# Patient Record
Sex: Female | Born: 1950
Health system: Southern US, Community
[De-identification: ages and names within clinical notes are randomized; demographics above are authoritative.]

## PROBLEM LIST (undated history)

## (undated) DIAGNOSIS — F329 Major depressive disorder, single episode, unspecified: Secondary | ICD-10-CM

## (undated) DIAGNOSIS — I10 Essential (primary) hypertension: Secondary | ICD-10-CM

## (undated) DIAGNOSIS — E039 Hypothyroidism, unspecified: Secondary | ICD-10-CM

## (undated) DIAGNOSIS — K5730 Diverticulosis of large intestine without perforation or abscess without bleeding: Secondary | ICD-10-CM

## (undated) DIAGNOSIS — K219 Gastro-esophageal reflux disease without esophagitis: Secondary | ICD-10-CM

## (undated) DIAGNOSIS — E785 Hyperlipidemia, unspecified: Secondary | ICD-10-CM

## (undated) DIAGNOSIS — Z8619 Personal history of other infectious and parasitic diseases: Secondary | ICD-10-CM

## (undated) DIAGNOSIS — K589 Irritable bowel syndrome without diarrhea: Secondary | ICD-10-CM

## (undated) DIAGNOSIS — M1990 Unspecified osteoarthritis, unspecified site: Secondary | ICD-10-CM

## (undated) DIAGNOSIS — T7840XA Allergy, unspecified, initial encounter: Secondary | ICD-10-CM

## (undated) DIAGNOSIS — F32A Depression, unspecified: Secondary | ICD-10-CM

## (undated) HISTORY — DX: Irritable bowel syndrome, unspecified: K58.9

## (undated) HISTORY — PX: CHOLECYSTECTOMY: SHX55

## (undated) HISTORY — DX: Hyperlipidemia, unspecified: E78.5

## (undated) HISTORY — DX: Diverticulosis of large intestine without perforation or abscess without bleeding: K57.30

## (undated) HISTORY — DX: Hypothyroidism, unspecified: E03.9

## (undated) HISTORY — PX: DILATION AND CURETTAGE OF UTERUS: SHX78

## (undated) HISTORY — DX: Essential (primary) hypertension: I10

## (undated) HISTORY — DX: Allergy, unspecified, initial encounter: T78.40XA

## (undated) HISTORY — DX: Unspecified osteoarthritis, unspecified site: M19.90

## (undated) HISTORY — DX: Personal history of other infectious and parasitic diseases: Z86.19

## (undated) HISTORY — DX: Gastro-esophageal reflux disease without esophagitis: K21.9

---

## 1972-12-12 HISTORY — PX: APPENDECTOMY: SHX54

## 1981-12-12 HISTORY — PX: ABDOMINAL HYSTERECTOMY: SHX81

## 1981-12-12 HISTORY — PX: PARTIAL HYSTERECTOMY: SHX80

## 1988-12-12 HISTORY — PX: BLADDER DIVERTICULECTOMY: SHX1235

## 1997-12-12 HISTORY — PX: NASAL SEPTUM SURGERY: SHX37

## 2000-11-04 ENCOUNTER — Ambulatory Visit (HOSPITAL_COMMUNITY): Admission: RE | Admit: 2000-11-04 | Discharge: 2000-11-04 | Payer: Self-pay | Admitting: Neurosurgery

## 2000-11-04 ENCOUNTER — Encounter: Payer: Self-pay | Admitting: Neurosurgery

## 2005-06-09 ENCOUNTER — Ambulatory Visit: Payer: Self-pay | Admitting: Obstetrics and Gynecology

## 2006-06-27 ENCOUNTER — Ambulatory Visit: Payer: Self-pay | Admitting: Obstetrics and Gynecology

## 2007-03-27 ENCOUNTER — Ambulatory Visit: Payer: Self-pay | Admitting: Internal Medicine

## 2007-03-29 ENCOUNTER — Ambulatory Visit: Payer: Self-pay | Admitting: Internal Medicine

## 2007-03-29 ENCOUNTER — Encounter (INDEPENDENT_AMBULATORY_CARE_PROVIDER_SITE_OTHER): Payer: Self-pay | Admitting: Specialist

## 2007-04-04 ENCOUNTER — Ambulatory Visit (HOSPITAL_COMMUNITY): Admission: RE | Admit: 2007-04-04 | Discharge: 2007-04-04 | Payer: Self-pay | Admitting: Internal Medicine

## 2007-05-18 ENCOUNTER — Ambulatory Visit (HOSPITAL_COMMUNITY): Admission: RE | Admit: 2007-05-18 | Discharge: 2007-05-18 | Payer: Self-pay | Admitting: General Surgery

## 2007-05-18 ENCOUNTER — Encounter (INDEPENDENT_AMBULATORY_CARE_PROVIDER_SITE_OTHER): Payer: Self-pay | Admitting: General Surgery

## 2007-07-02 ENCOUNTER — Ambulatory Visit: Payer: Self-pay | Admitting: Obstetrics and Gynecology

## 2008-07-03 ENCOUNTER — Ambulatory Visit: Payer: Self-pay | Admitting: Obstetrics and Gynecology

## 2009-02-19 DIAGNOSIS — K5730 Diverticulosis of large intestine without perforation or abscess without bleeding: Secondary | ICD-10-CM | POA: Insufficient documentation

## 2009-02-19 DIAGNOSIS — E039 Hypothyroidism, unspecified: Secondary | ICD-10-CM | POA: Insufficient documentation

## 2009-02-19 DIAGNOSIS — K589 Irritable bowel syndrome without diarrhea: Secondary | ICD-10-CM | POA: Insufficient documentation

## 2009-02-19 DIAGNOSIS — R109 Unspecified abdominal pain: Secondary | ICD-10-CM | POA: Insufficient documentation

## 2009-02-19 DIAGNOSIS — R110 Nausea: Secondary | ICD-10-CM | POA: Insufficient documentation

## 2009-02-24 ENCOUNTER — Ambulatory Visit: Payer: Self-pay | Admitting: Internal Medicine

## 2009-02-27 ENCOUNTER — Encounter: Payer: Self-pay | Admitting: Gastroenterology

## 2009-02-27 ENCOUNTER — Ambulatory Visit: Payer: Self-pay | Admitting: Gastroenterology

## 2009-03-03 ENCOUNTER — Encounter: Payer: Self-pay | Admitting: Gastroenterology

## 2009-03-04 ENCOUNTER — Telehealth: Payer: Self-pay | Admitting: Internal Medicine

## 2009-03-27 ENCOUNTER — Ambulatory Visit: Payer: Self-pay | Admitting: Internal Medicine

## 2009-03-27 DIAGNOSIS — Z860100 Personal history of colon polyps, unspecified: Secondary | ICD-10-CM | POA: Insufficient documentation

## 2009-07-06 ENCOUNTER — Ambulatory Visit: Payer: Self-pay | Admitting: Obstetrics and Gynecology

## 2010-02-09 ENCOUNTER — Ambulatory Visit: Payer: Self-pay | Admitting: Urology

## 2010-04-11 LAB — CONVERTED CEMR LAB: Pap Smear: NORMAL

## 2010-06-11 LAB — HM MAMMOGRAPHY: HM Mammogram: NORMAL

## 2010-07-07 ENCOUNTER — Ambulatory Visit: Payer: Self-pay | Admitting: Obstetrics and Gynecology

## 2010-09-09 ENCOUNTER — Ambulatory Visit: Payer: Self-pay | Admitting: Family Medicine

## 2010-09-09 DIAGNOSIS — J342 Deviated nasal septum: Secondary | ICD-10-CM | POA: Insufficient documentation

## 2010-10-07 ENCOUNTER — Telehealth: Payer: Self-pay | Admitting: Internal Medicine

## 2010-10-07 ENCOUNTER — Ambulatory Visit: Payer: Self-pay | Admitting: Internal Medicine

## 2010-10-07 DIAGNOSIS — R1032 Left lower quadrant pain: Secondary | ICD-10-CM | POA: Insufficient documentation

## 2010-10-07 LAB — CONVERTED CEMR LAB
Bilirubin Urine: NEGATIVE
Casts: 0 /lpf
Ketones, urine, test strip: NEGATIVE
Specific Gravity, Urine: 1.01
Urobilinogen, UA: 0.2
pH: 6

## 2010-10-08 ENCOUNTER — Encounter: Payer: Self-pay | Admitting: Family Medicine

## 2010-10-08 LAB — CONVERTED CEMR LAB
ALT: 17 units/L (ref 0–35)
AST: 16 units/L (ref 0–37)
Albumin: 3.9 g/dL (ref 3.5–5.2)
Alkaline Phosphatase: 77 units/L (ref 39–117)
BUN: 12 mg/dL (ref 6–23)
Basophils Relative: 0.6 % (ref 0.0–3.0)
Chloride: 97 meq/L (ref 96–112)
Eosinophils Relative: 0.7 % (ref 0.0–5.0)
Glucose, Bld: 80 mg/dL (ref 70–99)
Lipase: 32 units/L (ref 11.0–59.0)
Lymphocytes Relative: 35.2 % (ref 12.0–46.0)
MCV: 90.6 fL (ref 78.0–100.0)
Monocytes Relative: 7.8 % (ref 3.0–12.0)
Neutrophils Relative %: 55.7 % (ref 43.0–77.0)
Potassium: 3.7 meq/L (ref 3.5–5.1)
RBC: 4.38 M/uL (ref 3.87–5.11)
WBC: 8.9 10*3/uL (ref 4.5–10.5)

## 2010-10-11 ENCOUNTER — Ambulatory Visit: Payer: Self-pay | Admitting: Gastroenterology

## 2010-10-12 ENCOUNTER — Ambulatory Visit: Payer: Self-pay | Admitting: Cardiovascular Disease

## 2010-11-09 ENCOUNTER — Ambulatory Visit: Payer: Self-pay | Admitting: Internal Medicine

## 2010-11-09 LAB — HM COLONOSCOPY

## 2010-11-17 ENCOUNTER — Telehealth (INDEPENDENT_AMBULATORY_CARE_PROVIDER_SITE_OTHER): Payer: Self-pay | Admitting: *Deleted

## 2010-11-18 ENCOUNTER — Ambulatory Visit: Payer: Self-pay | Admitting: Internal Medicine

## 2010-11-18 LAB — CONVERTED CEMR LAB
Nitrite: NEGATIVE
Specific Gravity, Urine: <1.005
Urobilinogen, UA: 0.2

## 2010-11-23 ENCOUNTER — Ambulatory Visit: Payer: Self-pay | Admitting: Urology

## 2010-11-29 ENCOUNTER — Ambulatory Visit: Payer: Self-pay | Admitting: Urology

## 2011-01-11 NOTE — Progress Notes (Signed)
Summary: uti  Phone Note Call from Patient Call back at Home Phone [phone]   Caller: Patient Call For: Judith Part MD Summary of Call: Patient was seen by Dr. Sharen Hones on 10-07-10 and was treated for a UTI. Patient is calling today stating that she is in pain, having burning with urination and feels exactly the way she did before taking the cipro and the flagyl. She is asking if she could have something called in for the uti. I explained to patient that she would possibly need an office visit, and she was fine with that, but we have not openings until Friday. Please advise. Uses [facility] on [address] if needed.  Initial call taken by: Melody Comas,  November 17, 2010 1:55 PM  Follow-up for Phone Call        actually it sounds like she had diverticular flare - and not uti in light of urine culture that was neg  (correct me if I am wrong...)  if she has urinary burning -- please come in now and leave urine sample let me know if other symptoms as well  we are all in mtg until end of the day-- the doctors I will look at urine at end of day and instruct further  if she has worrisome symptoms-- fever / abd pain-- then may have to send her to urgent care (or call GI if she is having diverticulitis symptoms)   Follow-up by: Judith Part MD,  November 17, 2010 2:47 PM  Additional Follow-up for Phone Call Additional follow up Details #1::        Sp w/ pt, appt scheduled for Thursday, Dec 8th w/ Dr. Sharen Hones.Daine Gip  November 17, 2010 5:25 PM  Additional Follow-up by: Daine Gip,  November 17, 2010 5:25 PM

## 2011-01-11 NOTE — Assessment & Plan Note (Signed)
Summary: ACUTE ??UTI-SEE DR TOWER'S NOTES CYD   Vital Signs:  Patient profile:   60 year old female Weight:      176.75 pounds Temp:     98.9 degrees F oral Pulse rate:   84 / minute Pulse rhythm:   regular BP sitting:   150 / 90  (left arm) Cuff size:   regular  Vitals Entered By: Selena Batten Dance CMA Duncan Dull) (November 18, 2010 3:30 PM) CC: ? UTI   History of Present Illness: CC: L sided pain  1 wk 2d h/o L lower abd pain as well as flank area.  Pain characterized as soreness and cramping in LLQ and L lower back.  No stool changes, no fevers/chills, blood in stool or urine.  No new foods in last week.  Has cut out meats.  Eating fruits and vegetables and cereal and soups.  Appetite ok.  Saw Dr. Juanda Chance last week, thought improving, now returned.  Weight pretty stable.  No shooting pain down into groin.  h/o cholecystectomy with residual diarrhea.  has been taking bentyl 10mg  in am and align every morning and not noticed much improvement.  taking miralax daily.  Has been on bentyl for a couple years.  Similar pain to previously.  Current Medications (verified): 1)  Zyrtec Allergy 10 Mg Tabs (Cetirizine Hcl) .... One Tablet By Mouth Once Daily As Needed 2)  Multivitamins   Tabs (Multiple Vitamin) .... One Tablet By Mouth Once Daily 3)  Advil 200 Mg Tabs (Ibuprofen) .... Otc As Directed. 4)  Advil Congestion Relief 10-200 Mg Tabs (Phenylephrine-Ibuprofen) .... Otc As Directed. 5)  Miralax  Powd (Polyethylene Glycol 3350) .... Once Daily 6)  Flexeril 10 Mg Tabs (Cyclobenzaprine Hcl) .... 1/2 To 1 By Mouth Up Three Times A Day As Needed Neck Pain Watch Out For Sedation 7)  Calcium-Vitamin D 500-200 Mg-Unit Tabs (Calcium-Vitamin D) .... Once Daily 8)  Align  Caps (Probiotic Product) .... Once Daily 9)  Bentyl 10 Mg Caps (Dicyclomine Hcl) .Marland Kitchen.. 1 By Mouth Every A.m.  Allergies (verified): No Known Drug Allergies  Past History:  Past Medical History: Last updated:  10/11/2010 HYPOTHYROIDISM (ICD-244.9) IRRITABLE BOWEL SYNDROME (ICD-564.1) HYPERTENSION (ICD-401.9) DIVERTICULOSIS, COLON (ICD-562.10) hyperplastic colon polyps  hyperlipidemia  cystitis frequent (? related to ibs)  osteopenia  all rhinitis   Dr Juanda Chance - GI  urology Dr Karis Juba - at [facility] - gyn  neurosx-- Dr Channing Mutters  Review of Systems       per HPI  Physical Exam  General:  Well developed, well nourished. nontoxic.  uncomfortable with motion. Mouth:  Oral mucosa and oropharynx without lesions or exudates.  Teeth in good repair.  slight dry mm Lungs:  Clear throughout to auscultation.  no crackles/wheezing Heart:  Regular rate and rhythm; no murmurs, rubs or bruits. Abdomen:  soft, ND, NABS, minmially tender throughout, main tenderness concentrated at LLQ.  Neg rebound, no guarding.  no masses, no HSM appreciated.  + tenderness with CVA on L side Pulses:  2+ rad pulses Extremities:  no edema Skin:  Intact without suspicious lesions or rashes   Impression & Recommendations:  Problem # 1:  ABDOMINAL PAIN, LEFT LOWER QUADRANT (ICD-789.04) unclear etiology.  blood work from 10/27 looking ok.  abd/pelvic CT w/ contrast from 10/12/2010 negative for acute process.  pt states improved with cipro/flagyl initially.  Has been on low dose bentyl, align, and miralax but not noticing improving.  No fevers.  ? flare of IBS.  UA again  today with mod blood but not significant on micro.  given findings of CVA tenderness and micro hematuria, check KUB - no stone evident.  Treat as stone with vicodin, flomax, strain urine.  Has been going on for 2 mo.  no weight changes.  nonsmoker.  If not improved with above measures, referral to uro for eval.  discussed KUB with patient.  advised to watch for constipation.  Orders: Admin of Therapeutic Inj  intramuscular or subcutaneous (95188) Ketorolac-Toradol 15mg  (C1660) T-1 View Abdomen (KUB) (74000TC) UA Dipstick W/ Micro (manual) (63016)  Complete  Medication List: 1)  Zyrtec Allergy 10 Mg Tabs (Cetirizine hcl) .... One tablet by mouth once daily as needed 2)  Multivitamins Tabs (Multiple vitamin) .... One tablet by mouth once daily 3)  Advil 200 Mg Tabs (Ibuprofen) .... Otc as directed. 4)  Advil Congestion Relief 10-200 Mg Tabs (Phenylephrine-ibuprofen) .... Otc as directed. 5)  Miralax Powd (Polyethylene glycol 3350) .... Once daily 6)  Flexeril 10 Mg Tabs (Cyclobenzaprine hcl) .... 1/2 to 1 by mouth up three times a day as needed neck pain watch out for sedation 7)  Calcium-vitamin D 500-200 Mg-unit Tabs (Calcium-vitamin d) .... Once daily 8)  Align Caps (Probiotic product) .... Once daily 9)  Bentyl 10 Mg Caps (Dicyclomine hcl) .Marland Kitchen.. 1 by mouth twice daily 10)  Flomax 0.4 Mg Caps (Tamsulosin hcl) .... One daily for 7 days 11)  Vicodin 5-500 Mg Tabs (Hydrocodone-acetaminophen) .... One every 6 hours as needed pain  Patient Instructions: 1)  I'll call you with results of xray. 2)  Start flomax daily for 7 days (helps relax ureters). 3)  vicodin for pain. 4)  Strain urine. 5)  Push fluids. 6)  If not better, call us and we will set you up with urologist. Prescriptions: VICODIN 5-500 MG TABS (HYDROCODONE-ACETAMINOPHEN) one every 6 hours as needed pain  #20 x 0   Entered and Authorized by:   Eustaquio Boyden  MD   Signed by:   Eustaquio Boyden  MD on 11/18/2010   Method used:   Print then Give to Patient   RxID:   [id number] FLOMAX 0.4 MG CAPS (TAMSULOSIN HCL) one daily for 7 days  #7 x 0   Entered and Authorized by:   Eustaquio Boyden  MD   Signed by:   Eustaquio Boyden  MD on 11/18/2010   Method used:   Electronically to        [facility] [address] (retail)       [address], [address]       [place], [place]  [address]       Ph: [phone]       Fax: [phone]   RxID:   [id number]    Medication Administration  Injection # 1:    Medication: Ketorolac-Toradol 15mg      Diagnosis: ABDOMINAL PAIN, LEFT LOWER QUADRANT (ICD-789.04)    Route: IM    Site: LUOQ gluteus    Exp Date: 02/10/2012    Lot #: [id number]    Mfr: Hospira    Comments: 30mg  per Dr. Sharen Hones    Patient tolerated injection without complications    Given by: Selena Batten Dance CMA Duncan Dull) (November 18, 2010 4:35 PM)  Orders Added: 1)  Admin of Therapeutic Inj  intramuscular or subcutaneous [96372] 2)  Ketorolac-Toradol 15mg  [J1885] 3)  T-1 View Abdomen (KUB) [74000TC] 4)  Est. Patient Level IV [93716] 5)  UA Dipstick W/  Micro (manual) [81000]    Current Allergies (reviewed today): No known allergies   Laboratory Results   Urine Tests  Date/Time Received: November 18, 2010 3:31 PM  Date/Time Reported: November 18, 2010 3:31 PM   Routine Urinalysis   Color: straw Appearance: Clear Glucose: negative   (Normal Range: Negative) Bilirubin: negative   (Normal Range: Negative) Ketone: negative   (Normal Range: Negative) Spec. Gravity: <1.005   (Normal Range: 1.003-1.035) Blood: moderate   (Normal Range: Negative) pH: 6.0   (Normal Range: 5.0-8.0) Protein: negative   (Normal Range: Negative) Urobilinogen: 0.2   (Normal Range: 0-1) Nitrite: negative   (Normal Range: Negative) Leukocyte Esterace: small   (Normal Range: Negative)  Urine Microscopic WBC/HPF: rare RBC/HPF: rare Bacteria/HPF: tr Mucous/HPF: no Epithelial/HPF: rare Crystals/HPF: no Casts/LPF: no Yeast/HPF: no    Comments: read by ................Eustaquio Boyden  MD  November 18, 2010 6:04 PM
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
## 2011-01-11 NOTE — Progress Notes (Signed)
Summary: Abd Pain   Phone Note Call from Patient Call back at Work Phone [phone]   Call For: Dr Juanda Chance Reason for Call: Talk to Nurse Summary of Call: Alot of Abd Pain & back thinks its IBS flare up. Wants to be seen sooner than her appt on tuesday next wk. Initial call taken by: Leanor Kail [facility],  October 07, 2010 8:19 AM  Follow-up for Phone Call        Patient states she has had constan tleft side pain that radiates to left back since Monday. She went to a walk in clinic and "they told me it was my bladder." Patient given Macrobid 100 mg two times a day, Uribel as needed discomfort, Vicodin as needed. Denies burning with urination, vomiting and diarrhea. Had one episode of nausea a couple of days ago and has chills. C/O constipation but states her last BM was today. She is using stool softners and Citracil. Suggested patient try Miralax 1-2 times/day for constipation, use Bentyl sparingly as it can constipate and to call her primary to recheck urinary problems. She will keep her appointment here on 10/12/10. Patient will call if worsen problems with bowels. Follow-up by: Jesse Fall RN,  October 07, 2010 9:12 AM

## 2011-01-11 NOTE — Assessment & Plan Note (Signed)
Summary: ?IBS flare/regina   History of Present Illness Visit Type: consult  Primary GI MD: Lina Sar MD Primary Provider: Roxy Manns, MD  Requesting Provider: Roxy Manns, MD  Chief Complaint: IBS flare, left side abd pain, constipation, diarrhea, bloating, and back pain  History of Present Illness:   60 YO FEMALE KNOWN TO DR. Juanda Chance. SHE HAS HX OF COLON POLYPS,IBS AND DIVERTICULOSIS. LAST COLON 3/10 WITH ONE HYPERPLASTIC POLYP, MODERATE LEFT SIDED DIVERTICULOSIS. SHE COMES IN TODAY WITH ONE WEEK HX OF LEFT SIDED ABDOMINAL PAIN  RADIATING INTO HER LEFT BACK. PAIN HAS BEEN CONSTTANT, AND SHE RATES IT AN 8/10. INITIALLY HAD NAUSEA, NO VOMITING. RELATES LOW GRADE FEVERS, CHILLS. NO DYSURIA, URGENCY. SHE HAD BEEN CONSTIPATED UNTIL TODAY WITH LOOSE STOOL-NO HEME. SHE WAS SEEN AT URGENT CARE LAST WEEK ,TOLD PROBABLE UTI BUT CULTURE WAS NEGATIVE. SHE THEN SAW DR. Sharen Hones ON 10/27  AND WAS STARTED ON CIPRO AND FLAGYL FOR PROBABLE DIVERICULITIS. LABS INCLUDING CBC/CMET ALL NEGATIVE. SHE DOES NOT FEEL ANY BETTER, IS TAKING VICODAN REGULARLY FOR THE PAIN WHICH IS CONSTANT, ACHING.   GI Review of Systems    Reports abdominal pain and  bloating.     Location of  Abdominal pain: left side.    Denies acid reflux, belching, chest pain, dysphagia with liquids, dysphagia with solids, heartburn, loss of appetite, nausea, vomiting, vomiting blood, weight loss, and  weight gain.      Reports constipation, diarrhea, and  irritable bowel syndrome.     Denies anal fissure, black tarry stools, change in bowel habit, diverticulosis, fecal incontinence, heme positive stool, hemorrhoids, jaundice, light color stool, liver problems, rectal bleeding, and  rectal pain.   Current Medications (verified): 1)  Zyrtec Allergy 10 Mg Tabs (Cetirizine Hcl) .... One Tablet By Mouth Once Daily As Needed 2)  Multivitamins   Tabs (Multiple Vitamin) .... One Tablet By Mouth Once Daily 3)  Advil 200 Mg Tabs (Ibuprofen) ....  Otc As Directed. 4)  Advil Congestion Relief 10-200 Mg Tabs (Phenylephrine-Ibuprofen) .... Otc As Directed. 5)  Miralax  Powd (Polyethylene Glycol 3350) .... Once Daily 6)  Flexeril 10 Mg Tabs (Cyclobenzaprine Hcl) .... 1/2 To 1 By Mouth Up Three Times A Day As Needed Neck Pain Watch Out For Sedation 7)  Vicodin 5-500 Mg Tabs (Hydrocodone-Acetaminophen) .Marland Kitchen.. 1 By Mouth Four Times Daily As Needed 8)  Ciprofloxacin Hcl 500 Mg Tabs (Ciprofloxacin Hcl) .... One By Mouth Two Times A Day X 10 Days 9)  Flagyl 500 Mg Tabs (Metronidazole) .... One By Mouth Three Times A Day X 10 Days  Allergies (verified): No Known Drug Allergies  Past History:  Past Medical History: HYPOTHYROIDISM (ICD-244.9) IRRITABLE BOWEL SYNDROME (ICD-564.1) HYPERTENSION (ICD-401.9) DIVERTICULOSIS, COLON (ICD-562.10) hyperplastic colon polyps  hyperlipidemia  cystitis frequent (? related to ibs)  osteopenia  all rhinitis   Dr Juanda Chance - GI  urology Dr Karis Juba - at [facility] - gyn  neurosx-- Dr Channing Mutters  Past Surgical History: Reviewed history from 10/07/2010 and no changes required. Cholecystectomy 2008 C-Section x 2 D & C X 2 Hysterectomy partial 1983  (cyst on ovary and  heavy bleeding)  Urinary bladder diverticulectomy 1990 Deviated septum 1999  colonoscopy 2010 - diverticulosis and polyp Juanda Chance)  Family History: Reviewed history from 09/09/2010 and no changes required. No FH of Colon Cancer: Family History of Diabetes: Grandfather sister with childhood rheumatic fever / valve dz  Mother : Living arthritis, elevated cholesterol, atrial fib, high blood pressure Father: Living elevated cholesterol brother DVT (  after immobilization)   Social History: Reviewed history from 09/09/2010 and no changes required. Divorced 2 boys 24, 99 -- live close by and 2 grandaughters  Alcohol Use - no Illicit Drug Use - no Patient has never smoked.  Daily Caffeine Use Occupation:Sales-- [employer]    Regular exercise-no lives in noisy environment - very stressful   Review of Systems       The patient complains of back pain and urination changes/pain.  The patient denies allergy/sinus, anemia, anxiety-new, arthritis/joint pain, blood in urine, breast changes/lumps, change in vision, confusion, cough, coughing up blood, depression-new, fainting, fatigue, fever, headaches-new, hearing problems, heart murmur, heart rhythm changes, itching, menstrual pain, muscle pains/cramps, night sweats, nosebleeds, pregnancy symptoms, shortness of breath, skin rash, sleeping problems, sore throat, swelling of feet/legs, swollen lymph glands, thirst - excessive , urination - excessive , urine leakage, vision changes, and voice change.         SEE HPI  Vital Signs:  Patient profile:   60 year old female Height:      63.75 inches Weight:      177 pounds BMI:     30.73 BSA:     1.85 Temp:     98.7 degrees F oral Pulse rate:   60 / minute Pulse rhythm:   regular BP sitting:   124 / 60  (left arm) Cuff size:   regular  Vitals Entered By: Ok Anis CMA (October 11, 2010 2:05 PM)  Physical Exam  General:  Well developed, well nourished, no acute distress. Head:  Normocephalic and atraumatic. Eyes:  PERRLA, no icterus. Lungs:  Clear throughout to auscultation. Heart:  Regular rate and rhythm; no murmurs, rubs,  or bruits. Abdomen:  SOFT, NONDISTENDED, BS+ ,QIUTE TENDER LLQ/LMQ NO REBOUND, NO MASS OR HSM,NO RASH Rectal:  NOT DONE Extremities:  No clubbing, cyanosis, edema or deformities noted. Neurologic:  Alert and  oriented x4;  grossly normal neurologically. Psych:  Alert and cooperative. Normal mood and affect.  Impression & Recommendations:  Problem # 1:  ABDOMINAL PAIN, LEFT LOWER QUADRANT (ICD-789.04) Assessment Unchanged 60 YO FEMALE WITH ONE WEEK HX OF CONSTANT LLQ/LMQ PAIN -CONSISTENT WITH DIVERTICULITIS. SHE IS NO BETTER AFTER 4 DAYS OF ANTIBIOTICS-WILL R/O COMPLICATED  DIVERTICULITIS. LABS 10/27 REVIEWED SCHEDULE FOR CT ABD/PELVIS WITH CONSTRAST CONTINUE CIPRO 500 two times a day X 10 DAYS CONTINUE FLAGYL 500  3 X DAILY X 10 DAYS WILL ADJUST ABX BASED ON CT. REFILL VICODAN 5/500   # 30 /0 REFILLS Orders: CT Abdomen/Pelvis with Contrast (CT Abd/Pelvis w/con)  Problem # 2:  COLONIC POLYPS, HYPERPLASTIC, HX OF (ICD-V12.72) Assessment: Comment Only LAST COLON 3/10  Patient Instructions: 1)  Copy sent to : Roxy Manns, MD  2)  Your CT Scan is scheduled for 10/12/2010 at 11:30am at [facility] CT 3)  Map has been givien 4)  We are sending in a new prescription of Vicodin for you today 5)  We have given you 2 bottles of contrast today with instructions 6)  The medication list was reviewed and reconciled.  All changed / newly prescribed medications were explained.  A complete medication list was provided to the patient / caregiver.  Prescriptions: VICODIN 5-500 MG TABS (HYDROCODONE-ACETAMINOPHEN) 1 by mouth four times daily as needed  #30 x 0   Entered by:   Merri Ray CMA (AAMA)   Authorized by:   Sammuel Cooper PA-c   Signed by:   Merri Ray CMA (AAMA) on 10/11/2010   Method used:  Printed then faxed to ...       [facility] [address] (retail)       [address], [address]       [place], [place]  [address]       Ph: [phone]       Fax: [phone]   RxID:   [id number]

## 2011-01-11 NOTE — Letter (Signed)
Summary: Patient Questionnaire  Patient Questionnaire   Imported By: Beau Fanny 09/09/2010 14:23:20  _____________________________________________________________________  External Attachment:    Type:   Image     Comment:   External Document

## 2011-01-11 NOTE — Assessment & Plan Note (Signed)
Summary: FOLLOW UP/YF    History of Present Illness Visit Type: Follow-up Visit Primary GI MD: Lina Sar MD Primary Kealey Kemmer: Roxy Manns, MD  Requesting Tahmid Stonehocker: Roxy Manns, MD  Chief Complaint: ibs still lingering since appt. with Amy, some soreness in abdomen History of Present Illness:   This is a 60 year old white female with irritable bowel syndrome and postcholecystectomy diarrhea. She has a history of a hyperplastic polyp of the colon on her last colonoscopy in March 2010. She was seen by Mike Gip, PA-C on October 31 as an acute work-in for diarrhea and left lower quadrant abdominal pain. She has responded to dietary modifications, probiotics and dicyclomine 10 mg. A CT Scan of the abdomen was normal. There is a history of H. pylori gastritis on an endoscopy completed in 2000. She underwent a cholecystectomy in 2008 after her HIDA scan ejection fraction was found to be 49%.   GI Review of Systems    Reports abdominal pain and  bloating.      Denies acid reflux, belching, chest pain, dysphagia with liquids, dysphagia with solids, heartburn, loss of appetite, nausea, vomiting, vomiting blood, weight loss, and  weight gain.      Reports constipation, diarrhea, and  irritable bowel syndrome.     Denies anal fissure, black tarry stools, change in bowel habit, diverticulosis, fecal incontinence, heme positive stool, hemorrhoids, jaundice, light color stool, liver problems, rectal bleeding, and  rectal pain.    Current Medications (verified): 1)  Zyrtec Allergy 10 Mg Tabs (Cetirizine Hcl) .... One Tablet By Mouth Once Daily As Needed 2)  Multivitamins   Tabs (Multiple Vitamin) .... One Tablet By Mouth Once Daily 3)  Advil 200 Mg Tabs (Ibuprofen) .... Otc As Directed. 4)  Advil Congestion Relief 10-200 Mg Tabs (Phenylephrine-Ibuprofen) .... Otc As Directed. 5)  Miralax  Powd (Polyethylene Glycol 3350) .... Once Daily 6)  Flexeril 10 Mg Tabs (Cyclobenzaprine Hcl) .... 1/2 To 1 By  Mouth Up Three Times A Day As Needed Neck Pain Watch Out For Sedation 7)  Calcium-Vitamin D 500-200 Mg-Unit Tabs (Calcium-Vitamin D) .... Once Daily 8)  Align  Caps (Probiotic Product) .... Once Daily  Allergies (verified): No Known Drug Allergies  Past History:  Past Medical History: Reviewed history from 10/11/2010 and no changes required. HYPOTHYROIDISM (ICD-244.9) IRRITABLE BOWEL SYNDROME (ICD-564.1) HYPERTENSION (ICD-401.9) DIVERTICULOSIS, COLON (ICD-562.10) hyperplastic colon polyps  hyperlipidemia  cystitis frequent (? related to ibs)  osteopenia  all rhinitis   Dr Juanda Chance - GI  urology Dr Karis Juba - at [facility] - gyn  neurosx-- Dr Channing Mutters  Past Surgical History: Reviewed history from 10/07/2010 and no changes required. Cholecystectomy 2008 C-Section x 2 D & C X 2 Hysterectomy partial 1983  (cyst on ovary and  heavy bleeding)  Urinary bladder diverticulectomy 1990 Deviated septum 1999  colonoscopy 2010 - diverticulosis and polyp Juanda Chance)  Family History: Reviewed history from 10/11/2010 and no changes required. No FH of Colon Cancer: Family History of Diabetes: Grandfather sister with childhood rheumatic fever / valve dz  Mother : Living arthritis, elevated cholesterol, atrial fib, high blood pressure Father: Living elevated cholesterol brother DVT (after immobilization)   Social History: Reviewed history from 09/09/2010 and no changes required. Divorced 2 boys 49, 106 -- live close by and 2 grandaughters  Alcohol Use - no Illicit Drug Use - no Patient has never smoked.  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  The patient denies anemia, anxiety-new, blood in urine, breast changes/lumps, change in vision, confusion, cough, coughing  up blood, depression-new, fainting, fatigue, fever, headaches-new, hearing problems, heart murmur, heart rhythm changes, itching, menstrual pain, muscle pains/cramps, night sweats, nosebleeds, pregnancy symptoms, shortness of breath, skin rash, sore throat, swelling of feet/legs, swollen lymph glands, thirst - excessive , urination - excessive , urination changes/pain, vision changes, and voice change.         Pertinent positive and negative review of systems were noted in the above HPI. All other ROS was otherwise negative.   Vital Signs:  Patient profile:   60 year old female Height:      63.75 inches Weight:      176.38 pounds BMI:     30.62 Pulse rate:   92 / minute Pulse rhythm:   regular BP sitting:   128 / 80  (left arm) Cuff size:   regular  Vitals Entered By: June McMurray CMA Duncan Dull) (November 09, 2010 9:49 AM)  Physical Exam  General:  Well developed, well nourished, no acute distress. Lungs:  Clear throughout to auscultation. Heart:  Regular rate and rhythm; no murmurs, rubs,  or bruits. Abdomen:  soft relaxed abdomen with normal active bowel sounds. No distention. Mild tenderness along sigmoid colon. No rebound, no mass. Psych:  Alert and cooperative. Normal mood and affect.   Impression & Recommendations:  Problem # 1:  ABDOMINAL PAIN, LEFT LOWER QUADRANT (ICD-789.04) Patient his significantly improved, her  left lower quadrant abdominal pain is  related to  an irritable bowel syndrome and possibly mild diverticulosis although there was no evidence of diverticulitis on CT scan. She will continue on probiotics and Bentyl 10 mg every morning.  Problem # 2:  COLONIC POLYPS, HYPERPLASTIC, HX OF (ICD-V12.72) Patient will be due for a recall colonoscopy in March 2020.  Patient Instructions: 1)  high-fiber diet. 2)  Probiotic One-A-Day. 3)  Bentyl 10 mg p.o. q.a.m. 4)  Recall colonoscopy March 2020. 5)  Copy sent to : Dr Milinda Antis 6)  The medication list was reviewed and  reconciled.  All changed / newly prescribed medications were explained.  A complete medication list was provided to the patient / caregiver.

## 2011-01-11 NOTE — Assessment & Plan Note (Signed)
Summary: UTI/CLE   Vital Signs:  Patient profile:   60 year old female Weight:      179 pounds Temp:     97.7 degrees F oral Pulse rate:   60 / minute Pulse rhythm:   regular BP sitting:   142 / 82  (left arm) Cuff size:   large  Vitals Entered By: Selena Batten Dance CMA Duncan Dull) (October 07, 2010 12:48 PM) CC: ? UTI Comments Seen at [facility] on 10-04-10. Treated for UTI. Not feeling any better. No UCx done at that time   History of Present Illness: CC: back pain/?UTI  Seen Monday at [facility]) with back pain and told may have UTI.  No UCx.  Started on macrobid two times a day has been taking for 4 days.  Not feeling better.  L lower back pain mainly at superior buttock, and LLQ pain.  + sweating and chills but no documented fevers.  2 nights ago nauseated, no vomiting.  + constipation.  No dysuria, urgency, frequency.  Now with LLQ pain and fullness.  Appetite ok, no change with what she eats.  No vag discharge, itch.  h/o IBS as well as diverticulosis never itis.  h/o recurrent cystitis, first saw Dr. Wanda Plump then Dr. Karis Juba (urogynecologist) who thought recurrent infections were from inflammation from irritable bowel.  Told to take citrucel.  Also taking stool softeners.  To see Dr. Juanda Chance next week.  holding off on bentyl 2/2 constipation.  Current Medications (verified): 1)  Zyrtec Allergy 10 Mg Tabs (Cetirizine Hcl) .... One Tablet By Mouth Once Daily As Needed 2)  Multivitamins   Tabs (Multiple Vitamin) .... One Tablet By Mouth Once Daily 3)  Bentyl 10 Mg Caps (Dicyclomine Hcl) .... Take 1 Tablet By Mouth Two Times A Day As Needed For Irritable Bowel 4)  Advil 200 Mg Tabs (Ibuprofen) .... Otc As Directed. 5)  Advil Congestion Relief 10-200 Mg Tabs (Phenylephrine-Ibuprofen) .... Otc As Directed. 6)  Citrucel  Powd (Methylcellulose (Laxative)) .... Otc As Directed. 7)  Flexeril 10 Mg Tabs (Cyclobenzaprine Hcl) .... 1/2 To 1 By Mouth Up Three Times A Day As Needed Neck Pain Watch Out  For Sedation 8)  Macrobid 100 Mg Caps (Nitrofurantoin Monohyd Macro) .Marland Kitchen.. 1 By Mouth Bid 9)  Uribel 118 Mg Caps (Meth-Hyo-M Bl-Na Phos-Ph Sal) .Marland Kitchen.. 1 By Mouth Four Times Daily As Needed 10)  Vicodin 5-500 Mg Tabs (Hydrocodone-Acetaminophen) .Marland Kitchen.. 1 By Mouth Four Times Daily As Needed  Allergies (verified): No Known Drug Allergies  Past History:  Social History: Last updated: 09/09/2010 Divorced 2 boys 33, 37 -- live close by and 2 grandaughters  Alcohol Use - no Illicit Drug Use - no Patient has never smoked.  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 nontoxic.  uncomfortable with motion. Mouth:  Oral mucosa and oropharynx without lesions or exudates.  Teeth in good repair.  MMM Lungs:  Clear throughout to auscultation.  no crackles/wheezing Heart:  Regular rate and rhythm; no murmurs, rubs or bruits. Abdomen:  soft, ND, NABS, minmially tender throughout,  main tenderness concentrated at LLQ.  Neg rebound, no guarding.  no masses, no HSM appreciated.  mild tenderness with CVA bilaterally Msk:  minmial tenderness throughout palpation of back as well. Pulses:  2+ rad pulses Extremities:  no edema Skin:  Intact without suspicious lesions or rashes   Impression & Recommendations:  Problem # 1:  ABDOMINAL PAIN, LEFT LOWER QUADRANT (ICD-789.04) in h/o diverticulosis.  UA not suspicious for infection however has been on macrobid x 4 days.  Sent urine for culture.  subjective fevers.  ? if diverticulitis.  treat with course of  cipro/flagyl.  f/u with GI.  return sooner if worsening, red flags.  check blood work, eval for leukocytosis which would be consistent with diverticulitis.  discussed diet to continue for now.  Orders: UA Dipstick W/ Micro (manual) (13244) Specimen Handling (99000) T-Culture, Urine ([id number]) TLB-CBC Platelet - w/Differential (85025-CBCD) TLB-BMP (Basic Metabolic Panel-BMET) (80048-METABOL) TLB-Hepatic/Liver Function Pnl (80076-HEPATIC) TLB-Lipase (83690-LIPASE)  Complete Medication List: 1)  Zyrtec Allergy 10 Mg Tabs (Cetirizine hcl) .... One tablet by mouth once daily as needed 2)  Multivitamins Tabs (Multiple vitamin) .... One tablet by mouth once daily 3)  Bentyl 10 Mg Caps (Dicyclomine hcl) .... Take 1 tablet by mouth two times a day as needed for irritable bowel 4)  Advil 200 Mg Tabs (Ibuprofen) .... Otc as directed. 5)  Advil Congestion Relief 10-200 Mg Tabs (Phenylephrine-ibuprofen) .... Otc as directed. 6)  Citrucel Powd (Methylcellulose (laxative)) .... Otc as directed. 7)  Flexeril 10 Mg Tabs (Cyclobenzaprine hcl) .... 1/2 to 1 by mouth up three times a day as needed neck pain watch out for sedation 8)  Uribel 118 Mg Caps (Meth-hyo-m bl-na phos-ph sal) .Marland Kitchen.. 1 by mouth four times daily as needed 9)  Vicodin 5-500 Mg Tabs (Hydrocodone-acetaminophen) .Marland Kitchen.. 1 by mouth four times daily as needed 10)  Ciprofloxacin Hcl 500 Mg Tabs (Ciprofloxacin hcl) .... One by mouth two times a day x 10 days 11)  Flagyl 500 Mg Tabs (Metronidazole) .... One by mouth three times a day x 10 days  Patient Instructions: 1)  urine today not suspicious for infection, however you have been on antibiotic.  urine culture sent.  2)  I would like to get blood work today. 3)  I will treat you with 2 antibiotics for possible diverticulitis. 4)  STOP Macrobid 5)  For next several days, take it easy with food.  soups, clears.  as you start feeling better, slowly advance to soft bland foods. 6)  Let us know if  fever >101.5, if worsening abdominal pain or if not improving as expected. 7)  Good to see you today, call clinic with questions.  Prescriptions: FLAGYL 500 MG TABS (METRONIDAZOLE) one by mouth three times a day x 10 days  #30 x 0   Entered and Authorized by:   Eustaquio Boyden  MD   Signed by:   Eustaquio Boyden  MD on 10/07/2010   Method used:   Electronically to        [facility] [address] (retail)       [address], [address]       [place], [place]  [address]       Ph: [phone]       Fax: [phone]   RxID:   [id number] CIPROFLOXACIN HCL 500 MG TABS (CIPROFLOXACIN HCL) one by mouth two times a day x 10 days  #20 x 0   Entered  and Authorized by:   Eustaquio Boyden  MD   Signed by:   Eustaquio Boyden  MD on 10/07/2010   Method used:   Electronically to        [facility] [address] (retail)       [address]       [place], [place]  [address]       Ph: [phone]       Fax: [phone]   RxID:   [id number]    Orders Added: 1)  Est. Patient Level III [84132] 2)  UA Dipstick W/ Micro (manual) [81000] 3)  Specimen Handling [99000] 4)  T-Culture, Urine [[id number]] 5)  TLB-CBC Platelet - w/Differential [85025-CBCD] 6)  TLB-BMP (Basic Metabolic Panel-BMET) [80048-METABOL] 7)  TLB-Hepatic/Liver Function Pnl [80076-HEPATIC] 8)  TLB-Lipase [83690-LIPASE]    Current Allergies (reviewed today): No known allergies   Laboratory Results   Urine Tests  Date/Time Received: October 07, 2010  Date/Time Reported: October 07, 2010   Routine Urinalysis   Color: yellow Appearance: Hazy Glucose: negative   (Normal Range: Negative) Bilirubin: negative   (Normal Range: Negative) Ketone: negative   (Normal Range: Negative) Spec. Gravity: 1.010   (Normal Range: 1.003-1.035) Blood: moderate   (Normal Range: Negative) pH: 6.0   (Normal Range: 5.0-8.0) Protein: negative   (Normal Range:  Negative) Urobilinogen: 0.2   (Normal Range: 0-1) Nitrite: negative   (Normal Range: Negative) Leukocyte Esterace: small   (Normal Range: Negative)  Urine Microscopic WBC/HPF: 1-5 RBC/HPF: rare Bacteria/HPF: tr Mucous/HPF: 0 Epithelial/HPF: rare Crystals/HPF: 0 Casts/LPF: 0    Comments: read by ........................Eustaquio Boyden  MD  October 07, 2010 1:13 PM  UCx sent

## 2011-02-19 ENCOUNTER — Encounter: Payer: Self-pay | Admitting: Family Medicine

## 2011-03-11 ENCOUNTER — Encounter: Payer: Self-pay | Admitting: Family Medicine

## 2011-03-11 ENCOUNTER — Ambulatory Visit (INDEPENDENT_AMBULATORY_CARE_PROVIDER_SITE_OTHER): Payer: 59 | Admitting: Family Medicine

## 2011-03-11 VITALS — BP 140/88 | HR 68 | Temp 98.4°F | Wt 174.1 lb

## 2011-03-11 DIAGNOSIS — J0110 Acute frontal sinusitis, unspecified: Secondary | ICD-10-CM

## 2011-03-11 MED ORDER — GUAIFENESIN-CODEINE 100-10 MG/5ML PO SYRP: 5.0000 mL | ORAL_SOLUTION | Freq: Two times a day (BID) | ORAL | Status: AC | PRN

## 2011-03-11 MED ORDER — AMOXICILLIN 875 MG PO TABS: 875.0000 mg | ORAL_TABLET | Freq: Two times a day (BID) | ORAL | Status: AC

## 2011-03-11 MED ORDER — FLUTICASONE PROPIONATE 50 MCG/ACT NA SUSP: 2.0000 | Freq: Every day | NASAL | Status: DC

## 2011-03-11 NOTE — Patient Instructions (Signed)
You have a sinus infection. Take medicine as prescribed: flonase 2 sprays in each nostril daily.  Antibiotic to hold on to in case not improving.  cheratussin for cough at night. Push fluids and plenty of rest. Nasal saline irrigation or neti pot to help drain sinuses. May use simple mucinex with plenty of fluid to help mobilize mucous. Return if fever >101.5, trouble opening/closing mouth, difficulty swallowing, or worsening.

## 2011-03-11 NOTE — Progress Notes (Signed)
  Subjective:    Patient ID: Jill Torres, female    DOB: 04-Feb-1951, 60 y.o.   MRN: [id number]  HPI CC: sinus pressure  Bad allergies, taking claritin.  Using neti pot and tylenol sinus.  Allergic to pine, thinks due to this.  Eye doctor given pt pataday but too expensive so didn't fill.  Allergies started acting up this week (~5 days).  Today worse.  Feeling lots of facial pressure.  Endorses chills.  No purulent discharge.  L side feels swollen.  + dry cough, tickle in throat.  + neck pain, posterior and anterior. + tooth pain this morning and ears popping.  H/o tooth abscess  No fevers, abd pain, n/v/d, myalgias, arthralgias.  No sick contacts at home.  No smokers at home.  No h/o asthma.  Review of Systems Per HPI    Objective:   Physical Exam  [vitalsreviewed. Constitutional: She appears well-developed and well-nourished. No distress.  HENT:  Head: Normocephalic and atraumatic.  Right Ear: External ear normal.  Left Ear: External ear normal.  Nose: No mucosal edema or rhinorrhea. Right sinus exhibits maxillary sinus tenderness and frontal sinus tenderness. Left sinus exhibits maxillary sinus tenderness and frontal sinus tenderness.  Mouth/Throat: Uvula is midline, oropharynx is clear and moist and mucous membranes are normal. No oropharyngeal exudate.       Frontal > maxillary tenderness. + pale turbinates.  Eyes: Conjunctivae and EOM are normal. Pupils are equal, round, and reactive to light. No scleral icterus.  Neck: Normal range of motion. Neck supple. No JVD present. No thyromegaly present.  Cardiovascular: Normal rate, regular rhythm, normal heart sounds and intact distal pulses.   No murmur heard. Pulmonary/Chest: Effort normal and breath sounds normal. No respiratory distress. She has no wheezes. She has no rales.  Lymphadenopathy:    She has no cervical adenopathy.  Skin: Skin is warm and dry. No rash noted.          Assessment & Plan:

## 2011-03-11 NOTE — Assessment & Plan Note (Signed)
Likely viral.  Supportive care with continued neti pot, fluids, and mucinex. Start flonase, cheratussin for cough at night. WASP script given weekend and worsening.

## 2011-03-31 ENCOUNTER — Telehealth: Payer: Self-pay | Admitting: Family Medicine

## 2011-03-31 DIAGNOSIS — J0110 Acute frontal sinusitis, unspecified: Secondary | ICD-10-CM

## 2011-03-31 DIAGNOSIS — Z0000 Encounter for general adult medical examination without abnormal findings: Secondary | ICD-10-CM | POA: Insufficient documentation

## 2011-03-31 NOTE — Telephone Encounter (Signed)
Message copied by Roxy Manns on Thu Mar 31, 2011  5:15 PM ------      Message from: Liane Comber      Created: Wed Mar 30, 2011 11:33 AM      Regarding: Cpx labs Mon 4/30       Please order  future cpx labs for pt's upcomming lab appt.      Thanks      Rodney Booze

## 2011-04-07 ENCOUNTER — Telehealth: Payer: Self-pay | Admitting: *Deleted

## 2011-04-07 NOTE — Telephone Encounter (Signed)
Thanks for the update  The prednisone will inc  Her white blood cell count- but I will expect it Should be ok with augmentin Go through with the labs as planned

## 2011-04-07 NOTE — Telephone Encounter (Signed)
Pt has been taking augmentin since yesterday and she is due to come in for physical labs on Monday, will the antibiotic throw the results off?  Also taking prednisone.

## 2011-04-07 NOTE — Telephone Encounter (Signed)
LMOM advising pt

## 2011-04-07 NOTE — Telephone Encounter (Signed)
Patient notified as instructed by telephone. 

## 2011-04-11 ENCOUNTER — Other Ambulatory Visit (INDEPENDENT_AMBULATORY_CARE_PROVIDER_SITE_OTHER): Payer: 59 | Admitting: Family Medicine

## 2011-04-11 DIAGNOSIS — E785 Hyperlipidemia, unspecified: Secondary | ICD-10-CM

## 2011-04-11 DIAGNOSIS — Z0000 Encounter for general adult medical examination without abnormal findings: Secondary | ICD-10-CM

## 2011-04-11 LAB — CBC WITH DIFFERENTIAL/PLATELET
Basophils Absolute: 0 10*3/uL (ref 0.0–0.1)
Eosinophils Relative: 0 % (ref 0.0–5.0)
MCV: 90.5 fl (ref 78.0–100.0)
Monocytes Absolute: 0.6 10*3/uL (ref 0.1–1.0)
Neutrophils Relative %: 71.7 % (ref 43.0–77.0)
Platelets: 355 10*3/uL (ref 150.0–400.0)
WBC: 11.6 10*3/uL — ABNORMAL HIGH (ref 4.5–10.5)

## 2011-04-11 LAB — COMPREHENSIVE METABOLIC PANEL
ALT: 16 U/L (ref 0–35)
AST: 14 U/L (ref 0–37)
Albumin: 3.6 g/dL (ref 3.5–5.2)
Alkaline Phosphatase: 64 U/L (ref 39–117)
Glucose, Bld: 78 mg/dL (ref 70–99)
Potassium: 3.7 mEq/L (ref 3.5–5.1)
Sodium: 139 mEq/L (ref 135–145)
Total Protein: 6.6 g/dL (ref 6.0–8.3)

## 2011-04-11 LAB — LIPID PANEL
Total CHOL/HDL Ratio: 4
VLDL: 25.8 mg/dL (ref 0.0–40.0)

## 2011-04-11 LAB — LDL CHOLESTEROL, DIRECT: Direct LDL: 142.4 mg/dL

## 2011-04-11 LAB — TSH: TSH: 0.37 u[IU]/mL (ref 0.35–5.50)

## 2011-04-18 ENCOUNTER — Encounter: Payer: Self-pay | Admitting: Family Medicine

## 2011-04-18 ENCOUNTER — Ambulatory Visit (INDEPENDENT_AMBULATORY_CARE_PROVIDER_SITE_OTHER): Payer: 59 | Admitting: Family Medicine

## 2011-04-18 VITALS — BP 138/84 | HR 76 | Temp 98.2°F | Ht 64.0 in | Wt 176.5 lb

## 2011-04-18 DIAGNOSIS — Z0000 Encounter for general adult medical examination without abnormal findings: Secondary | ICD-10-CM

## 2011-04-18 DIAGNOSIS — N390 Urinary tract infection, site not specified: Secondary | ICD-10-CM | POA: Insufficient documentation

## 2011-04-18 DIAGNOSIS — Z1231 Encounter for screening mammogram for malignant neoplasm of breast: Secondary | ICD-10-CM

## 2011-04-18 DIAGNOSIS — K589 Irritable bowel syndrome without diarrhea: Secondary | ICD-10-CM

## 2011-04-18 DIAGNOSIS — Z1239 Encounter for other screening for malignant neoplasm of breast: Secondary | ICD-10-CM | POA: Insufficient documentation

## 2011-04-18 DIAGNOSIS — Z23 Encounter for immunization: Secondary | ICD-10-CM

## 2011-04-18 DIAGNOSIS — E039 Hypothyroidism, unspecified: Secondary | ICD-10-CM

## 2011-04-18 DIAGNOSIS — R109 Unspecified abdominal pain: Secondary | ICD-10-CM

## 2011-04-18 LAB — POCT URINALYSIS DIPSTICK
Protein, UA: NEGATIVE
Spec Grav, UA: 1.005
Urobilinogen, UA: 0.2
pH, UA: 6

## 2011-04-18 MED ORDER — CIPROFLOXACIN HCL 250 MG PO TABS: 250.0000 mg | ORAL_TABLET | Freq: Two times a day (BID) | ORAL | Status: AC

## 2011-04-18 NOTE — Patient Instructions (Signed)
Take cipro for urine infection and drink lots of water  We will culture your urine and call you back with result  We will do mammogram referral at check out Avoid red meat/ fried foods/ egg yolks/ fatty breakfast meats/ butter, cheese and high fat dairy/ and shellfish   Your cholesterol is mildly high Have a serious talk with your ENT doctor about allergy symptoms- these are controllable and should not interfere with your quality of life or self care  When able - start regular exercise Tdap today - (tetnus vaccine)

## 2011-04-18 NOTE — Assessment & Plan Note (Signed)
With trace blood in urine and also elevated wbc  Send for culture  Put on low dose cipro for 5 d Adv to inc water intake and update if symptoms change or worsen

## 2011-04-18 NOTE — Assessment & Plan Note (Signed)
Took bentyl off list since not cramping Controls with miralax prn  Nl colonosc this fall

## 2011-04-18 NOTE — Assessment & Plan Note (Signed)
Mam ordered  Unremarkable breast exam today Enc regular self breast exams

## 2011-04-18 NOTE — Assessment & Plan Note (Signed)
theraputic tsh No clinical changes No change - rev with pt

## 2011-04-18 NOTE — Progress Notes (Signed)
Subjective:    Patient ID: Jill Torres, female    DOB: 1951-03-22, 60 y.o.   MRN: [id number]  HPI Here for annual health mt exam and to review chronic problems and also for urine symptoms  Thinks she has a bladder infection   Having frequency of urination with pressure sensation - since end of last week No n/v or fever  No new back pain  Urine is burning in bladder but not on urination Mild elevated wbc at11.6 ua dip clear with trace blood  Had a lot of sinus problems - was on antibiotic a lot in April Still has post nasal drip  Sees Dr Andee Poles and Dr Mickle Asper stable with bmi of 30  Hypothyroid- stable tsh is tx Clinically  No pap- partial hyst in past for bleeding  No symptoms at all   Td- ? Last one    - needs to update that   Mam 7/11-- wants [place] to try to get mam appt -- [place]  Self exam - no lumps or problems   colonosc 11.11 -- hyperplastic polyp  Lipids a bit high with LDL 140s- but HDL is 62 Diet is horrible -- does not watch it at all or exercising  She does not feel good -- from sinus problems -- and not motivated ( symptoms year around)  Lab Results  Component Value Date   CHOL 228* 04/11/2011   Lab Results  Component Value Date   HDL 62.90 04/11/2011   No results found for this basename: [facility]   Lab Results  Component Value Date   TRIG 129.0 04/11/2011   Lab Results  Component Value Date   CHOLHDL 4 04/11/2011   Lab Results  Component Value Date   LDLDIRECT 142.4 04/11/2011    miralax -- takes for ibs - knows when she needs to take it  No longer uses bentyl for cramping for ibs  Past Medical History  Diagnosis Date  . Unspecified hypothyroidism   . Irritable bowel syndrome   . Unspecified essential hypertension   . Diverticulosis of colon (without mention of hemorrhage)     Past Surgical History  Procedure Date  . Cesarean section     x 2  . Cholecystectomy     x 2  . Dilation and curettage of uterus     x 2  . Partial  hysterectomy 1983    cyst on ovary and heavy bleeding   . Bladder diverticulectomy 1990  . Nasal septum surgery 1999    Family History  Problem Relation Age of Onset  . Arthritis Mother   . Hypertension Mother   . Hyperlipidemia Mother   . Atrial fibrillation Mother   . Hyperlipidemia Father   . Rheumatic fever Sister   . Deep vein thrombosis Brother     after immobilization     History   Social History  . Marital Status: Single    Spouse Name: N/A    Number of Children: 2  . Years of Education: N/A   Occupational History  . Sales     [employer]    Social History Main Topics  . Smoking status: Never Smoker   . Smokeless tobacco: Not on file  . Alcohol Use: No  . Drug Use: No  . Sexually Active: Not on file   Other Topics Concern  . Not on file   Social History Narrative   No regular exerciseLives in noisy environment- very stressful Daily caffeine use  Review of Systems Review of Systems  Constitutional: Negative for fever, appetite change, and unexpected weight change. pos for fatigue Eyes: Negative for pain and visual disturbance. ENT pos for sinus congestion  Respiratory: Negative for cough and shortness of breath.   Cardiovascular: Negative.   Gastrointestinal: Negative for nausea, diarrhea .  Genitourinary pos  for urgency and frequency. neg for blood in urine Skin: Negative for pallor.  Neurological: Negative for weakness, light-headedness, numbness and headaches.  Hematological: Negative for adenopathy. Does not bruise/bleed easily.  Psychiatric/Behavioral: Negative for dysphoric mood. The patient is not nervous/anxious.          Objective:   Physical Exam  Constitutional: She is oriented to person, place, and time. She appears well-developed and well-nourished. No distress.       overwt and well appearing   HENT:  Head: Normocephalic and atraumatic.  Right Ear: External ear normal.  Left Ear: External ear normal.  Nose:  Nose normal.  Mouth/Throat: Oropharynx is clear and moist.  Eyes: Conjunctivae and EOM are normal. Pupils are equal, round, and reactive to light.  Neck: Normal range of motion. Neck supple. No JVD present. Carotid bruit is not present. No thyromegaly present.  Cardiovascular: Normal rate, regular rhythm, normal heart sounds and intact distal pulses.   Pulmonary/Chest: Effort normal and breath sounds normal. No respiratory distress. She has no wheezes.  Abdominal: Soft. Bowel sounds are normal. She exhibits no distension and no mass. There is no tenderness.  Genitourinary: No breast swelling, tenderness, discharge or bleeding.  Musculoskeletal: Normal range of motion. She exhibits no edema and no tenderness.  Lymphadenopathy:    She has no cervical adenopathy.  Neurological: She is alert and oriented to person, place, and time. She has normal reflexes.  Skin: Skin is warm. No rash noted. No erythema. No pallor.  Psychiatric: She has a normal mood and affect.          Assessment & Plan:

## 2011-04-18 NOTE — Assessment & Plan Note (Signed)
Reviewed health habits including diet and exercise and skin cancer prevention Also reviewed health mt list, fam hx and immunizations  Breast exam done Mam ordered  Disc low sat fat diet and need for exercise

## 2011-04-19 LAB — URINE CULTURE

## 2011-04-26 NOTE — Op Note (Signed)
Jill Torres, Jill Torres             ACCOUNT NO.:  [id number]   MEDICAL RECORD NO.:  [id number]          PATIENT TYPE:  AMB   LOCATION:  DAY                          FACILITY:  [facility]   PHYSICIAN:  Sharlet Salina T. Hoxworth, M.D.DATE OF BIRTH:  1951-09-24   DATE OF PROCEDURE:  05/18/2007  DATE OF DISCHARGE:                               OPERATIVE REPORT   POSTOPERATIVE DIAGNOSIS:  Chronic cholecystitis.   POSTOPERATIVE DIAGNOSIS:  Chronic cholecystitis.   SURGICAL PROCEDURES:  Laparoscopic cholecystectomy with intraoperative  cholangiogram.   SURGEON:  Sharlet Salina T. Hoxworth, M.D.   ANESTHESIA:  General.   BRIEF HISTORY:  [name] is 60 year old female with persistent  symptoms of epigastric and right upper quadrant abdominal pain and  nausea.  She has had an extensive workup which is significant for a  gallbladder ultrasound showing some mild irregularity and thickening of  the gallbladder wall.  Due to persistent symptoms and probable chronic  cholecystitis, laparoscopic cholecystectomy with cholangiogram has been  recommended and accepted.  This nature of the procedure, indications,  risks of bleeding, infection, bile leak, bile duct injury, and failure  to relieve symptoms have been discussed and understood.  The patient is  now brought to the operating room for this procedure.   DESCRIPTION OF OPERATION:  The patient was brought to the operating room  and placed in the supine position on the operating table, and general  orotracheal anesthesia was induced.  The abdomen was widely sterilely  prepped and draped.  She was given preoperative antibiotics.  PAS were  placed.  Correct patient and procedure were verified.   Access was obtained without difficulty using open Hassan technique at  the umbilicus through a mattress suture of 0 Vicryl.  Standard four-port  technique was used under direct vision.  The gallbladder was visualized  and appeared questionably somewhat thickened  but not significantly  inflamed.  This fundus was grasped and elevated up over the liver and  the infundibulum retracted inferolaterally.  The peritoneum anterior and  posterior to Calot's triangle was incised, and fibrofatty tissue was  stripped off the neck of the gallbladder toward the porta hepatis.  The  distal gallbladder and Calot's triangle was thoroughly dissected.  The  cystic artery was clearly identified coursing up on the gallbladder  wall.  The cyst duct was identified and the cystic duct/gallbladder  junction dissected 360 degrees.  When the anatomy was clear, the cystic  artery was doubly clipped proximally, clipped distally, and the cystic  duct was clipped at the gallbladder junction.  An operative  cholangiogram was then obtained through the cystic duct which showed  good filling of a normal common bile duct and intrahepatic ducts with  free flow into the duodenum and no filling defects.  Following this, the  cholangiocath  was removed, and the cystic duct was clipped triply and  divided.  The cystic artery was divided.  The gallbladder was dissected  free from its bed using hook cautery and removed through the umbilicus.  Complete hemostasis was assured.  There was no evidence of visceral  injury on  inspection.  The  trocars were removed and all CO2 evacuated.  The  mattress suture was secured at the umbilicus.  The skin incisions were  closed with interrupted subcuticular 4-0 Monocryl and Dermabond.   The patient was taken to the recovery room in good condition.      Lorne Skeens. Hoxworth, M.D.  Electronically Signed     BTH/MEDQ  D:  05/18/2007  T:  05/18/2007  Job:  [id number]

## 2011-04-28 ENCOUNTER — Telehealth: Payer: Self-pay

## 2011-04-28 NOTE — Telephone Encounter (Signed)
Please have her drop off another urine specimen --- I'm not sure if this is infection or not  thanks

## 2011-04-28 NOTE — Telephone Encounter (Signed)
Patient notified as instructed by telephone.Pt finished cipro on 04/26/11 and felt better but now feels like pressure feeling again and she is not sure she is over the infection. Pt uses [facility] [address] if pharmacy needed and can reach pt on cell.

## 2011-04-28 NOTE — Telephone Encounter (Signed)
Message copied by Lewanda Rife on Thu Apr 28, 2011  6:53 PM ------      Message from: Roxy Manns      Created: Thu Apr 21, 2011  6:07 PM       Let pt know that urine cx only grew out a small amt of bacteria       Finish the cipro and let me know if symtoms do not improve

## 2011-04-29 NOTE — Telephone Encounter (Signed)
Left message for pt to call back  °

## 2011-04-29 NOTE — Assessment & Plan Note (Signed)
[facility]                         GASTROENTEROLOGY OFFICE NOTE   NAME:Cieslinski, Laurette                      MRN:          [id number]  DATE:03/27/2007                            DOB:          25-Apr-1951    Ms. Hollabaugh is a very nice 60 year old white female who is here today  for evaluation of her persistent dyspepsia and bloating.  We saw her in  2000 for similar symptoms of nausea, bloating, and she underwent upper  endoscopy, which was essentially negative with findings of negative H.  pylori test.  She did not have any hiatal hernia.  She also had a  flexible sigmoidoscopy, which confirmed presence of left-sided  diverticulosis.  She has continued to have dyspepsia, although her  symptoms have become worse in the last 2 months.  She has more reflux,  more tenderness throughout the abdomen in spite of having regular bowel  movements.  Within 15 to 20 minutes postprandially,  she develops  bloating and distention.  Her sister just had a laparoscopic  cholecystectomy for acute cholecystitis, which bordered with gangrenous  gallbladder.  We have done an upper abdominal ultrasound on Ms.  Huezo in 2000, which showed normal gallbladder and bile duct.  Ms.  Konieczny has been taking Protonix on an irregular basis, only p.r.n.  She also has taken probiotics.  She denies using aspirin or NSAIDS.  She  does not smoke.  She actually even denies any excessive stress recently.  Her weight has steadily increased.  When we saw her in 2000, she weighed  about 167 pounds.  She is currently at 174 pounds.  She has no  explanation for it.   MEDICATIONS:  1. Estrace cream 0.1 mg every other night.  2. Multiple vitamins.  3. Calcium.  4. Protonix 40 mg p.r.n.   PAST HISTORY:  Significant for:  1. High blood pressure.  2. Two C-sections.  3. Bladder diverticulum repair.  4. Another pelvic surgery.   FAMILY HISTORY:  Positive for diverticulosis in her  father, diabetes in  grandfather, high cholesterol in mother and father, cholecystectomy in  her sister.  Another sister had heart valve surgery.   Patient does not drink, and does not smoke.  She is single.  Works in  [employer].   REVIEW OF SYSTEMS:  Positive for allergies, sleeping problems.  Her last  LMP was 1983.  She had a hysterectomy in 1983.   PHYSICAL EXAMINATION:  Blood pressure 140/82.  Pulse 80.  Weight 174  pounds.  She was alert, oriented, and in no distress.  Sclerae anicteric.  Oral cavity was normal.  NECK:  Supple without adenopathy.  Thyroid was normal.  LUNGS:  Clear to auscultation.  No wheezes or rales.  COR:  With quiet precordium.  Normal S1 and normal S2.  ABDOMEN:  Soft, but tender to the right of the midline above the  umbilicus along the right costal margin.  Pounding over the liver did  not seem to precipitate any discomfort compared to the left side.  Her  liver edge was at costal margin.  Bowel sounds were normoactive to  somewhat hyperactive.  Lower abdomen showed well-healed surgical scars,  mild discomfort along the left and right lower quadrants.  No visible  distention.  No CVA tenderness.  RECTAL EXAM:  With normoactive tone.  Stool was soft.  Hemoccult  negative.   IMPRESSION:  A 60 year old white female with chronic dyspepsia with  acute exacerbation.  She has a strong family history of gallbladder  disease.  She also has a personal  history of gastroesophageal reflux.  Her history does not suggest any specific triggering factors.  I think  we ought to first rule out biliary dysfunction or possible peptic ulcer  disease based on my physical exam, which showed tenderness to the right  of the epigastrium.  She also may be candidate for screening colonoscopy  since she is 60 years old.   PLAN:  1. Upper abdominal ultrasound.  2. Start taking Protonix 40 mg a daily basis.  3. Upper endoscopy scheduled.  We will plan to do CLO test at the same       time.  4. Levsin sublingually 0.125 mg use q. 4 to 6 hours p.r.n. bloating.  5. Continue probiotics.  6. At some point we will also probably suggest a colonoscopy.     Hedwig Morton. Juanda Chance, MD  Electronically Signed    DMB/MedQ  DD: 03/27/2007  DT: 03/27/2007  Job #: [id number]   cc:   Alan Mulder, M.D.

## 2011-04-29 NOTE — Telephone Encounter (Signed)
Patient notified as instructed by telephone. Pt cannot come in today but she will come in on Mon morning and get urine specimen.

## 2011-05-02 ENCOUNTER — Ambulatory Visit: Payer: 59 | Admitting: Family Medicine

## 2011-05-02 ENCOUNTER — Telehealth: Payer: Self-pay | Admitting: Family Medicine

## 2011-05-02 DIAGNOSIS — Z0289 Encounter for other administrative examinations: Secondary | ICD-10-CM

## 2011-05-02 LAB — POCT URINALYSIS DIPSTICK
Bilirubin, UA: NEGATIVE
Glucose, UA: NEGATIVE
Ketones, UA: NEGATIVE
Nitrite, UA: NEGATIVE
Spec Grav, UA: 1.005

## 2011-05-02 NOTE — Progress Notes (Signed)
Pt still having pressure and burning when urinates with frequency of urine also. Pt said urologist at [facility] said IBS could cause frequent UTIs. Pt uses [facility] [address] if pharmacy needed and pt will have cell phone [phone].  Pt notified and documented in nurse visit note. 05/02/11.

## 2011-05-02 NOTE — Telephone Encounter (Signed)
Please let pt know that urine looks clear  Dip showed trace blood and otherwise neg -- and micro (I did ) showed 1-2 epis, no bacteria, no rbc,, no wbc , no casts or other findings  I adv drinking water and avoiding other beverages including caffiene/ soda/ juice and diet drinks If symptoms persist or worsen , let me know and will ref to urologist

## 2011-05-02 NOTE — Telephone Encounter (Signed)
Patient notified as instructed by telephone. 

## 2011-07-12 ENCOUNTER — Ambulatory Visit: Payer: Self-pay | Admitting: Family Medicine

## 2011-07-14 ENCOUNTER — Encounter: Payer: Self-pay | Admitting: Family Medicine

## 2011-09-21 NOTE — Progress Notes (Signed)
Dr Milinda Antis can you close this encounter. The computer said I do not have clearance to close. This has been addressed with telephone note 05/02/11. Thank you.

## 2011-09-29 LAB — COMPREHENSIVE METABOLIC PANEL
ALT: 19
AST: 19
Alkaline Phosphatase: 68
CO2: 26
Calcium: 9.4
GFR calc Af Amer: >60
Potassium: 3.7
Sodium: 140
Total Protein: 6.4

## 2011-09-29 LAB — CBC
MCHC: 33.8
RBC: 4.4
RDW: 12.7

## 2011-09-29 LAB — URINALYSIS, ROUTINE W REFLEX MICROSCOPIC
Bilirubin Urine: NEGATIVE
Specific Gravity, Urine: 1.018
Urobilinogen, UA: 0.2
pH: 6

## 2011-09-29 LAB — DIFFERENTIAL
Eosinophils Absolute: 0.1
Eosinophils Relative: 1
Lymphs Abs: 2.5
Monocytes Relative: 8

## 2011-09-29 LAB — URINE MICROSCOPIC-ADD ON

## 2012-02-08 NOTE — Progress Notes (Signed)
Dr Milinda Antis will you try to close this encounter. The computer said I do not have clearance to close and it has been opened since 04/2011. Thank you.

## 2012-04-11 DIAGNOSIS — Z8619 Personal history of other infectious and parasitic diseases: Secondary | ICD-10-CM

## 2012-04-11 HISTORY — DX: Personal history of other infectious and parasitic diseases: Z86.19

## 2012-12-31 NOTE — Progress Notes (Signed)
Hansel Starling attempts have been made since 04/2011 to close this encounter with no success. Please advise.

## 2013-06-13 ENCOUNTER — Encounter: Payer: Self-pay | Admitting: Internal Medicine

## 2013-06-13 ENCOUNTER — Ambulatory Visit (INDEPENDENT_AMBULATORY_CARE_PROVIDER_SITE_OTHER): Payer: BC Managed Care – PPO | Admitting: Internal Medicine

## 2013-06-13 VITALS — BP 130/90 | HR 76 | Temp 98.7°F | Ht 63.75 in | Wt 176.8 lb

## 2013-06-13 DIAGNOSIS — Z86010 Personal history of colonic polyps: Secondary | ICD-10-CM

## 2013-06-13 DIAGNOSIS — Z1239 Encounter for other screening for malignant neoplasm of breast: Secondary | ICD-10-CM

## 2013-06-13 DIAGNOSIS — Z13220 Encounter for screening for lipoid disorders: Secondary | ICD-10-CM

## 2013-06-13 DIAGNOSIS — K589 Irritable bowel syndrome without diarrhea: Secondary | ICD-10-CM

## 2013-06-13 DIAGNOSIS — K219 Gastro-esophageal reflux disease without esophagitis: Secondary | ICD-10-CM

## 2013-06-13 DIAGNOSIS — K5730 Diverticulosis of large intestine without perforation or abscess without bleeding: Secondary | ICD-10-CM

## 2013-06-13 DIAGNOSIS — E039 Hypothyroidism, unspecified: Secondary | ICD-10-CM

## 2013-06-13 DIAGNOSIS — R5383 Other fatigue: Secondary | ICD-10-CM

## 2013-06-13 DIAGNOSIS — R5381 Other malaise: Secondary | ICD-10-CM

## 2013-06-16 ENCOUNTER — Encounter: Payer: Self-pay | Admitting: Internal Medicine

## 2013-06-16 DIAGNOSIS — K219 Gastro-esophageal reflux disease without esophagitis: Secondary | ICD-10-CM | POA: Insufficient documentation

## 2013-06-16 NOTE — Assessment & Plan Note (Signed)
Bowels stable.  

## 2013-06-16 NOTE — Assessment & Plan Note (Signed)
No reported problems today.   Follow.   

## 2013-06-16 NOTE — Assessment & Plan Note (Signed)
Was on medication and followed by Dr Kerrie Pleasure.  On no medication now.  States was stopped by MD.  Check tsh.

## 2013-06-16 NOTE — Assessment & Plan Note (Signed)
States had colonoscopy 2010 and is due f/u 220.  See if we can obtain records.

## 2013-06-16 NOTE — Progress Notes (Signed)
Subjective:    Patient ID: Jill Torres, female    DOB: Feb 23, 1951, 62 y.o.   MRN: [id number]  HPI 62 year old female with past history of GERD, allergies and hypercholesterolemia who comes in today to follow up on these issues as well as to establish care.  States she found a tick on her 04/22/13.  On 04/25/13 she noticed a rash and then on 05/01/13 developed headache, chills and nausea.  She was started on Doxycycline.  She finished her medication 05/15/13.  Felt sick again 05/28/13.  To acute care on 06/02/13.  Had labs.  Does not know results.  Rash is better.  No headache or joint stiffness.  No fever.  Still with some decreased energy.  She also reports she was previously on thyroid medication.  Followed by Dr Kerrie Pleasure.  Released.  No longer on medication.  Denies any chest pain or tightness.  No increased sob.  Has never had an abnormal pap smear.  Bowels stable.  Had a colonoscopy in 2010.  States had hemorrhoids.  Question of polyps.  Reports due f/u colonoscopy in 10 years from last.     Past Medical History  Diagnosis Date  . Unspecified hypothyroidism   . Irritable bowel syndrome   . Unspecified essential hypertension   . Diverticulosis of colon (without mention of hemorrhage)   . Arthritis   . Allergy   . GERD (gastroesophageal reflux disease)   . History of chicken pox   . History of shingles may 2013    Current Outpatient Prescriptions on File Prior to Visit  Medication Sig Dispense Refill  . bifidobacterium infantis (ALIGN) capsule Take 1 capsule by mouth daily.        . calcium-vitamin D (OSCAL WITH D 500-200) 500-200 MG-UNIT per tablet Take 1 tablet by mouth daily.        . cetirizine (ZYRTEC) 10 MG chewable tablet Chew 10 mg by mouth daily.        Marland Kitchen ibuprofen (ADVIL,MOTRIN) 200 MG tablet Take 200 mg by mouth as directed.        . multivitamin (THERAGRAN) per tablet Take 1 tablet by mouth daily.        . polyethylene glycol (MIRALAX) powder Take 17 g by mouth as needed.         No current facility-administered medications on file prior to visit.    Review of Systems Patient denies any headache, lightheadedness or dizziness.  Has a history of allergy issues.  takes zyrtec.  No chest pain, tightness or palpitations.  No increased shortness of breath, cough or congestion.  No nausea or vomiting.  No acid reflux.  No abdominal pain or cramping.  No bowel change, such as diarrhea, constipation, BRBPR or melana.  No urine change.  The previous symptoms have resolved.  She still feels some fatigue.        Objective:   Physical Exam Filed Vitals:   06/13/13 1357  BP: 130/90  Pulse: 76  Temp: 98.7 F (2.35 C)   62 year old female in no acute distress.   HEENT:  Nares- clear.  Oropharynx - without lesions. NECK:  Supple.  Nontender.  No audible bruit.  HEART:  Appears to be regular. LUNGS:  No crackles or wheezing audible.  Respirations even and unlabored.  RADIAL PULSE:  Equal bilaterally.   ABDOMEN:  Soft, nontender.  Bowel sounds present and normal.  No audible abdominal bruit.   EXTREMITIES:  No increased edema present.  DP pulses palpable  and equal bilaterally.          Assessment & Plan:  PREVIOUS TICK EXPOSURE.  Previous symptoms as outlined.  Treated with doxycycline.  Symptoms have resolved.  Still with some fatigue.  Obtain records and most recent labs.    FATIGUE.  Will check cbc, met c and tsh.   HEALTH MAINTENANCE.  Schedule a physical for next visit.  Last mammogram 2012.  Overdue.  Colonoscopy 2010.  States she is due 2020.

## 2013-06-16 NOTE — Assessment & Plan Note (Signed)
Bowels stable.  Follow.   

## 2013-06-27 ENCOUNTER — Other Ambulatory Visit (INDEPENDENT_AMBULATORY_CARE_PROVIDER_SITE_OTHER): Payer: BC Managed Care – PPO

## 2013-06-27 ENCOUNTER — Encounter: Payer: Self-pay | Admitting: Internal Medicine

## 2013-06-27 DIAGNOSIS — Z13220 Encounter for screening for lipoid disorders: Secondary | ICD-10-CM

## 2013-06-27 DIAGNOSIS — R5383 Other fatigue: Secondary | ICD-10-CM

## 2013-06-27 DIAGNOSIS — E039 Hypothyroidism, unspecified: Secondary | ICD-10-CM

## 2013-06-27 DIAGNOSIS — R5381 Other malaise: Secondary | ICD-10-CM

## 2013-06-27 LAB — LIPID PANEL
Total CHOL/HDL Ratio: 4
VLDL: 20 mg/dL (ref 0.0–40.0)

## 2013-06-27 LAB — COMPREHENSIVE METABOLIC PANEL
AST: 14 U/L (ref 0–37)
Albumin: 3.8 g/dL (ref 3.5–5.2)
Alkaline Phosphatase: 63 U/L (ref 39–117)
Potassium: 4 mEq/L (ref 3.5–5.1)
Sodium: 139 mEq/L (ref 135–145)
Total Protein: 6.9 g/dL (ref 6.0–8.3)

## 2013-06-27 LAB — CBC WITH DIFFERENTIAL/PLATELET
Basophils Absolute: 0.1 10*3/uL (ref 0.0–0.1)
Eosinophils Absolute: 0.1 10*3/uL (ref 0.0–0.7)
MCHC: 34 g/dL (ref 30.0–36.0)
MCV: 89.2 fl (ref 78.0–100.0)
Monocytes Absolute: 0.5 10*3/uL (ref 0.1–1.0)
Neutrophils Relative %: 56.9 % (ref 43.0–77.0)
Platelets: 324 10*3/uL (ref 150.0–400.0)
WBC: 7.5 10*3/uL (ref 4.5–10.5)

## 2013-06-28 ENCOUNTER — Encounter: Payer: Self-pay | Admitting: Internal Medicine

## 2013-07-11 ENCOUNTER — Emergency Department: Payer: Self-pay | Admitting: Emergency Medicine

## 2013-07-11 LAB — BASIC METABOLIC PANEL
Anion Gap: 10 (ref 7–16)
BUN: 15 mg/dL (ref 7–18)
Calcium, Total: 9.9 mg/dL (ref 8.5–10.1)
Chloride: 105 mmol/L (ref 98–107)
Co2: 23 mmol/L (ref 21–32)
Creatinine: 1.32 mg/dL — ABNORMAL HIGH (ref 0.60–1.30)
EGFR (African American): 51 — ABNORMAL LOW
EGFR (Non-African Amer.): 44 — ABNORMAL LOW
Osmolality: 277 (ref 275–301)

## 2013-07-11 LAB — CBC
HCT: 40.9 % (ref 35.0–47.0)
HGB: 14.3 g/dL (ref 12.0–16.0)
MCH: 30.4 pg (ref 26.0–34.0)
MCV: 87 fL (ref 80–100)
RBC: 4.7 10*6/uL (ref 3.80–5.20)

## 2013-07-15 ENCOUNTER — Encounter: Payer: Self-pay | Admitting: Internal Medicine

## 2013-07-16 ENCOUNTER — Ambulatory Visit (INDEPENDENT_AMBULATORY_CARE_PROVIDER_SITE_OTHER): Payer: BC Managed Care – PPO | Admitting: Internal Medicine

## 2013-07-16 ENCOUNTER — Ambulatory Visit: Payer: Self-pay | Admitting: Internal Medicine

## 2013-07-16 ENCOUNTER — Encounter: Payer: Self-pay | Admitting: Internal Medicine

## 2013-07-16 VITALS — BP 130/90 | HR 93 | Temp 98.8°F | Ht 63.75 in | Wt 172.8 lb

## 2013-07-16 DIAGNOSIS — F411 Generalized anxiety disorder: Secondary | ICD-10-CM

## 2013-07-16 DIAGNOSIS — R079 Chest pain, unspecified: Secondary | ICD-10-CM

## 2013-07-16 DIAGNOSIS — N289 Disorder of kidney and ureter, unspecified: Secondary | ICD-10-CM

## 2013-07-16 DIAGNOSIS — D72829 Elevated white blood cell count, unspecified: Secondary | ICD-10-CM

## 2013-07-16 DIAGNOSIS — F419 Anxiety disorder, unspecified: Secondary | ICD-10-CM

## 2013-07-16 MED ORDER — ALPRAZOLAM 0.25 MG PO TABS: 0.2500 mg | ORAL_TABLET | Freq: Every day | ORAL | Status: DC | PRN

## 2013-07-16 MED ORDER — FLUOXETINE HCL 10 MG PO CAPS: 10.0000 mg | ORAL_CAPSULE | Freq: Every day | ORAL | Status: DC

## 2013-07-17 ENCOUNTER — Encounter: Payer: Self-pay | Admitting: Internal Medicine

## 2013-07-17 ENCOUNTER — Telehealth: Payer: Self-pay | Admitting: Internal Medicine

## 2013-07-17 DIAGNOSIS — F419 Anxiety disorder, unspecified: Secondary | ICD-10-CM | POA: Insufficient documentation

## 2013-07-17 NOTE — Telephone Encounter (Signed)
Please put her on lab schedule for Friday 07/19/13 (1:30).  Pt aware of appt, just need to put on schedule.  Thanks.

## 2013-07-17 NOTE — Assessment & Plan Note (Signed)
Increased stress and anxiety as outlined.  Discussed at length with her today.  Will start on Prozac 10mg  q day.  Also give her xanax .25mg  to use prn as directed.  Follow closely.  Get her back in soon to reassess.  Notify me if any worsening symptoms or problems.

## 2013-07-17 NOTE — Progress Notes (Signed)
Subjective:    Patient ID: Jill Torres, female    DOB: May 01, 1951, 62 y.o.   MRN: [id number]  HPI 62 year old female with past history of GERD, allergies and hypercholesterolemia who comes in today as a work in for an ER follow up.  States that 5 days ago, her mother was taken to the hospital after having hip surgery.  Apparently had a stroke and was not doing well.  Physicians not sure that she would survive.  While in the emergency room, Jill Torres started having chest discomfort and felt weak.  Some numbness and tingling in her hands.  They evaluated her with EKG and labs and she was told she was having a panic attack.  Was given valium in ER and a rx for valium to take with her.  She has not been taking. Her mother is doing better now.  Is at [facility].  Still confused.  Still with increased stress and anxiety.  Still occasionally will feel some discomfort in her chest.  Feels jittery inside.      Past Medical History  Diagnosis Date  . Unspecified hypothyroidism   . Irritable bowel syndrome   . Unspecified essential hypertension   . Diverticulosis of colon (without mention of hemorrhage)   . Arthritis   . Allergy   . GERD (gastroesophageal reflux disease)   . History of chicken pox   . History of shingles may 2013    Current Outpatient Prescriptions on File Prior to Visit  Medication Sig Dispense Refill  . bifidobacterium infantis (ALIGN) capsule Take 1 capsule by mouth daily.        . calcium-vitamin D (OSCAL WITH D 500-200) 500-200 MG-UNIT per tablet Take 1 tablet by mouth daily.        . cetirizine (ZYRTEC) 10 MG chewable tablet Chew 10 mg by mouth daily.        Marland Kitchen ibuprofen (ADVIL,MOTRIN) 200 MG tablet Take 200 mg by mouth as directed.        . multivitamin (THERAGRAN) per tablet Take 1 tablet by mouth daily.        . polyethylene glycol (MIRALAX) powder Take 17 g by mouth as needed.        No current facility-administered medications on file prior to visit.    Review of  Systems Patient denies any headache, lightheadedness or dizziness.  No sinus or allergy symptoms currently.  Chest tightness as outlined.  Intermittent.  Feels jittery inside.   No cough or congestion.  No nausea or vomiting.  No acid reflux.  No abdominal pain or cramping.  No bowel change, such as diarrhea, constipation, BRBPR or melana.  No urine change.      Objective:   Physical Exam  Filed Vitals:   07/16/13 1105  BP: 130/90  Pulse: 93  Temp: 98.8 F (37.1 C)   Blood pressure recheck:  148/74, pulse 90  62 year old female in no acute distress.   HEENT:  Nares- clear.  Oropharynx - without lesions. NECK:  Supple.  Nontender.  No audible bruit.  HEART:  Appears to be regular. LUNGS:  No crackles or wheezing audible.  Respirations even and unlabored.  RADIAL PULSE:  Equal bilaterally.   ABDOMEN:  Soft, nontender.  Bowel sounds present and normal.  No audible abdominal bruit.   EXTREMITIES:  No increased edema present.  DP pulses palpable and equal bilaterally.          Assessment & Plan:  CARDIOLOGY.  Given persistent chest  tightness as outlined, repeat EKG obtained today.  Does feel better.  EKG today revealed SR with no acute ischemic change.  EKG in ER - non specific ST and Twave abnormality.  Given this and the persistent intermittent chest tightness, will go ahead and pursue a stress test to confirm no active ischemia.  If any acute change or problem, will need reevaluation.  Treat anxiety as outlined.   LEUKOCYTOSIS.  Noted in ER.  Recheck cbc to confirm normal.   RENAL INSUFFICIENCY.  Stay hydrated.  Noted slight increase in ER.  Recheck metabolic panel.     HEALTH MAINTENANCE.  Scheduled for a physical for next visit.  Last mammogram 2012.  Overdue. Colonoscopy 2010.  States she is due 2020.

## 2013-07-18 NOTE — Telephone Encounter (Signed)
appointment made

## 2013-07-19 ENCOUNTER — Other Ambulatory Visit (INDEPENDENT_AMBULATORY_CARE_PROVIDER_SITE_OTHER): Payer: BC Managed Care – PPO

## 2013-07-19 DIAGNOSIS — D72829 Elevated white blood cell count, unspecified: Secondary | ICD-10-CM

## 2013-07-19 DIAGNOSIS — N289 Disorder of kidney and ureter, unspecified: Secondary | ICD-10-CM

## 2013-07-19 LAB — BASIC METABOLIC PANEL
BUN: 9 mg/dL (ref 6–23)
CO2: 25 mEq/L (ref 19–32)
Calcium: 8.9 mg/dL (ref 8.4–10.5)
Chloride: 106 mEq/L (ref 96–112)
Creatinine, Ser: 1 mg/dL (ref 0.4–1.2)

## 2013-07-19 LAB — CBC WITH DIFFERENTIAL/PLATELET
Basophils Absolute: 0.1 10*3/uL (ref 0.0–0.1)
Basophils Relative: 0.6 % (ref 0.0–3.0)
Eosinophils Absolute: 0.1 10*3/uL (ref 0.0–0.7)
MCHC: 32.9 g/dL (ref 30.0–36.0)
MCV: 91.3 fl (ref 78.0–100.0)
Monocytes Absolute: 0.7 10*3/uL (ref 0.1–1.0)
Neutro Abs: 6.2 10*3/uL (ref 1.4–7.7)
Neutrophils Relative %: 60.6 % (ref 43.0–77.0)
RBC: 4.3 Mil/uL (ref 3.87–5.11)
RDW: 12.7 % (ref 11.5–14.6)

## 2013-07-21 ENCOUNTER — Encounter: Payer: Self-pay | Admitting: Internal Medicine

## 2013-07-26 ENCOUNTER — Encounter: Payer: Self-pay | Admitting: Internal Medicine

## 2013-08-15 ENCOUNTER — Encounter: Payer: Self-pay | Admitting: *Deleted

## 2013-08-16 ENCOUNTER — Encounter: Payer: Self-pay | Admitting: Internal Medicine

## 2013-08-16 ENCOUNTER — Ambulatory Visit (INDEPENDENT_AMBULATORY_CARE_PROVIDER_SITE_OTHER): Payer: BC Managed Care – PPO | Admitting: Internal Medicine

## 2013-08-16 VITALS — BP 130/90 | HR 58 | Temp 98.1°F | Ht 63.5 in | Wt 167.5 lb

## 2013-08-16 DIAGNOSIS — E039 Hypothyroidism, unspecified: Secondary | ICD-10-CM

## 2013-08-16 DIAGNOSIS — K5730 Diverticulosis of large intestine without perforation or abscess without bleeding: Secondary | ICD-10-CM

## 2013-08-16 DIAGNOSIS — F411 Generalized anxiety disorder: Secondary | ICD-10-CM

## 2013-08-16 DIAGNOSIS — F419 Anxiety disorder, unspecified: Secondary | ICD-10-CM

## 2013-08-16 DIAGNOSIS — Z1211 Encounter for screening for malignant neoplasm of colon: Secondary | ICD-10-CM

## 2013-08-16 DIAGNOSIS — K589 Irritable bowel syndrome without diarrhea: Secondary | ICD-10-CM

## 2013-08-16 DIAGNOSIS — IMO0001 Reserved for inherently not codable concepts without codable children: Secondary | ICD-10-CM

## 2013-08-16 DIAGNOSIS — Z860100 Personal history of colon polyps, unspecified: Secondary | ICD-10-CM

## 2013-08-16 DIAGNOSIS — Z0000 Encounter for general adult medical examination without abnormal findings: Secondary | ICD-10-CM

## 2013-08-16 DIAGNOSIS — R030 Elevated blood-pressure reading, without diagnosis of hypertension: Secondary | ICD-10-CM

## 2013-08-16 DIAGNOSIS — K219 Gastro-esophageal reflux disease without esophagitis: Secondary | ICD-10-CM

## 2013-08-16 DIAGNOSIS — R0989 Other specified symptoms and signs involving the circulatory and respiratory systems: Secondary | ICD-10-CM

## 2013-08-16 MED ORDER — FLUOXETINE HCL 10 MG PO CAPS: 10.0000 mg | ORAL_CAPSULE | Freq: Every day | ORAL | Status: DC

## 2013-08-16 NOTE — Progress Notes (Signed)
Subjective:    Patient ID: Jill Torres, female    DOB: 1951-05-30, 62 y.o.   MRN: [id number]  HPI 62 year old female with past history of GERD, allergies and hypercholesterolemia who comes in today for a scheduled follow up.  Last visit was having problems with increased stress and anxiety.  Was started on prozac.  Working well for her.  Feels she is handling things relatively well.  Rarely uses the xanax.  Is scheduled to have her stress test next week (9/12).  See last note for details.  Due to get her flu shot 9/19.  Trying to stay active.  Breathing stable.  No acid reflux.  No nausea or vomiting.  Bowels stable.      Past Medical History  Diagnosis Date  . Unspecified hypothyroidism   . Irritable bowel syndrome   . Unspecified essential hypertension   . Diverticulosis of colon (without mention of hemorrhage)   . Arthritis   . Allergy   . GERD (gastroesophageal reflux disease)   . History of chicken pox   . History of shingles may 2013    Current Outpatient Prescriptions on File Prior to Visit  Medication Sig Dispense Refill  . ALPRAZolam (XANAX) 0.25 MG tablet Take 1 tablet (0.25 mg total) by mouth daily as needed.  20 tablet  0  . bifidobacterium infantis (ALIGN) capsule Take 1 capsule by mouth daily.        . calcium-vitamin D (OSCAL WITH D 500-200) 500-200 MG-UNIT per tablet Take 1 tablet by mouth daily.        . cetirizine (ZYRTEC) 10 MG chewable tablet Chew 10 mg by mouth daily.       . fish oil-omega-3 fatty acids 1000 MG capsule Take 2 g by mouth daily.      Marland Kitchen ibuprofen (ADVIL,MOTRIN) 200 MG tablet Take 200 mg by mouth as directed.        . multivitamin (THERAGRAN) per tablet Take 1 tablet by mouth daily.        . polyethylene glycol (MIRALAX) powder Take 17 g by mouth as needed.        No current facility-administered medications on file prior to visit.    Review of Systems Patient denies any headache, lightheadedness or dizziness.  No sinus or allergy symptoms  currently.  Planning for stress test next week.  Breathing stable.   No cough or congestion.  No nausea or vomiting.  No acid reflux.  No abdominal pain or cramping.  No bowel change, such as diarrhea, constipation, BRBPR or melana.  No urine change.  Taking the prozac.  Helping.  Does not feel she needs a higher dose.  Has only used the xanax one or two times, since she got the rx.       Objective:   Physical Exam  Filed Vitals:   08/16/13 1428  BP: 130/90  Pulse: 58  Temp: 98.1 F (36.7 C)   Blood pressure recheck:  45-34/26  62 year old female in no acute distress.   HEENT:  Nares- clear.  Oropharynx - without lesions. NECK:  Supple.  Nontender.  Right carotid bruit.   HEART:  Appears to be regular. LUNGS:  No crackles or wheezing audible.  Respirations even and unlabored.  RADIAL PULSE:  Equal bilaterally.    BREASTS:  No nipple discharge or nipple retraction present.  Could not appreciate any distinct nodules or axillary adenopathy.  ABDOMEN:  Soft, nontender.  Bowel sounds present and normal.  No audible  abdominal bruit.  GU:  Not performed.    EXTREMITIES:  No increased edema present.  DP pulses palpable and equal bilaterally.          Assessment & Plan:  CARDIOLOGY.  See last note for details.  Scheduled to have her stress test next week.  Follow.    LEUKOCYTOSIS.  Noted in ER.  On recheck - normal.   RENAL INSUFFICIENCY.  Stay hydrated.  Noted slight increase in ER.  Follow up metabolic panel last visit - stable.     HEALTH MAINTENANCE.  Physical today.   Colonoscopy 2010.  States she is due 2020.  Is s/p hysterectomy and does not need yearly pap smears.  IFOB given.

## 2013-08-18 ENCOUNTER — Encounter: Payer: Self-pay | Admitting: Internal Medicine

## 2013-08-18 DIAGNOSIS — R0989 Other specified symptoms and signs involving the circulatory and respiratory systems: Secondary | ICD-10-CM | POA: Insufficient documentation

## 2013-08-18 DIAGNOSIS — I10 Essential (primary) hypertension: Secondary | ICD-10-CM | POA: Insufficient documentation

## 2013-08-18 NOTE — Assessment & Plan Note (Signed)
Was on medication and followed by Dr Moryati.  On no medication now.  States was stopped by MD.  Follow tsh.   

## 2013-08-18 NOTE — Assessment & Plan Note (Signed)
Obtain carotid ultrasound.   

## 2013-08-18 NOTE — Assessment & Plan Note (Signed)
On Prozac 10mg q day.  Rarely uses her xanax.  Doing better.  Follow closely.    

## 2013-08-18 NOTE — Assessment & Plan Note (Signed)
Blood pressure as outlined.  Have her spot check her pressures.  Get her back in soon to reassess.  Follow.

## 2013-08-18 NOTE — Assessment & Plan Note (Signed)
States had colonoscopy 2010 and is due f/u 220.  IFOB.

## 2013-08-18 NOTE — Assessment & Plan Note (Signed)
Physical today.   

## 2013-08-18 NOTE — Assessment & Plan Note (Signed)
Bowels stable.  

## 2013-08-18 NOTE — Assessment & Plan Note (Signed)
Bowels stable.  Follow.   

## 2013-08-18 NOTE — Assessment & Plan Note (Signed)
No reported problems today.   Follow.   

## 2013-08-22 ENCOUNTER — Other Ambulatory Visit: Payer: BC Managed Care – PPO

## 2013-08-23 ENCOUNTER — Other Ambulatory Visit (INDEPENDENT_AMBULATORY_CARE_PROVIDER_SITE_OTHER): Payer: BC Managed Care – PPO

## 2013-08-23 DIAGNOSIS — R079 Chest pain, unspecified: Secondary | ICD-10-CM

## 2013-08-24 ENCOUNTER — Encounter: Payer: Self-pay | Admitting: Internal Medicine

## 2013-08-27 ENCOUNTER — Encounter: Payer: Self-pay | Admitting: *Deleted

## 2013-09-02 ENCOUNTER — Encounter: Payer: Self-pay | Admitting: Internal Medicine

## 2013-10-17 ENCOUNTER — Other Ambulatory Visit: Payer: Self-pay

## 2013-10-22 ENCOUNTER — Encounter: Payer: Self-pay | Admitting: Internal Medicine

## 2013-12-06 ENCOUNTER — Other Ambulatory Visit: Payer: Self-pay | Admitting: Internal Medicine

## 2014-01-09 ENCOUNTER — Encounter: Payer: Self-pay | Admitting: Internal Medicine

## 2014-03-10 ENCOUNTER — Other Ambulatory Visit: Payer: Self-pay | Admitting: Internal Medicine

## 2014-04-08 ENCOUNTER — Other Ambulatory Visit: Payer: Self-pay | Admitting: Internal Medicine

## 2014-04-08 NOTE — Telephone Encounter (Signed)
Please advise on refill of Fluoxetine refill. Last refill was on 3/30 with notation to call office and schedule an appointment. No appointment has been schedule and last OV was in September

## 2014-04-09 NOTE — Telephone Encounter (Signed)
Please call and notify pt that she needs a f/u appt.   Please schedule.  Ok to refill x 1 (until appt).

## 2014-04-10 ENCOUNTER — Other Ambulatory Visit: Payer: Self-pay | Admitting: *Deleted

## 2014-04-10 NOTE — Telephone Encounter (Signed)
Waiting for call back   Let me know if I need to do anything.

## 2014-04-11 ENCOUNTER — Other Ambulatory Visit: Payer: Self-pay | Admitting: *Deleted

## 2014-04-16 ENCOUNTER — Other Ambulatory Visit: Payer: Self-pay | Admitting: *Deleted

## 2014-04-16 MED ORDER — FLUOXETINE HCL 10 MG PO CAPS: ORAL_CAPSULE | ORAL | Status: DC

## 2014-04-16 NOTE — Telephone Encounter (Signed)
Spoke with pt, appt scheduled 05/02/14

## 2014-04-16 NOTE — Telephone Encounter (Signed)
Pt already notified & Rx was refilled (see other note)

## 2014-05-02 ENCOUNTER — Encounter: Payer: Self-pay | Admitting: Internal Medicine

## 2014-05-02 ENCOUNTER — Ambulatory Visit (INDEPENDENT_AMBULATORY_CARE_PROVIDER_SITE_OTHER): Payer: BC Managed Care – PPO | Admitting: Internal Medicine

## 2014-05-02 VITALS — BP 130/70 | HR 72 | Temp 98.3°F | Ht 63.5 in | Wt 174.8 lb

## 2014-05-02 DIAGNOSIS — F419 Anxiety disorder, unspecified: Secondary | ICD-10-CM

## 2014-05-02 DIAGNOSIS — F411 Generalized anxiety disorder: Secondary | ICD-10-CM

## 2014-05-02 DIAGNOSIS — E7800 Pure hypercholesterolemia, unspecified: Secondary | ICD-10-CM

## 2014-05-02 DIAGNOSIS — K219 Gastro-esophageal reflux disease without esophagitis: Secondary | ICD-10-CM

## 2014-05-02 DIAGNOSIS — R0989 Other specified symptoms and signs involving the circulatory and respiratory systems: Secondary | ICD-10-CM

## 2014-05-02 DIAGNOSIS — R030 Elevated blood-pressure reading, without diagnosis of hypertension: Secondary | ICD-10-CM

## 2014-05-02 DIAGNOSIS — K5730 Diverticulosis of large intestine without perforation or abscess without bleeding: Secondary | ICD-10-CM

## 2014-05-02 DIAGNOSIS — Z1239 Encounter for other screening for malignant neoplasm of breast: Secondary | ICD-10-CM

## 2014-05-02 DIAGNOSIS — IMO0001 Reserved for inherently not codable concepts without codable children: Secondary | ICD-10-CM

## 2014-05-02 DIAGNOSIS — Z86010 Personal history of colonic polyps: Secondary | ICD-10-CM

## 2014-05-02 DIAGNOSIS — H938X9 Other specified disorders of ear, unspecified ear: Secondary | ICD-10-CM

## 2014-05-02 DIAGNOSIS — E039 Hypothyroidism, unspecified: Secondary | ICD-10-CM

## 2014-05-02 MED ORDER — NEOMYCIN-POLYMYXIN-HC 1 % OT SOLN
3.0000 [drp] | Freq: Three times a day (TID) | OTIC | Status: DC
Start: 2014-05-02 — End: 2014-09-02

## 2014-05-02 NOTE — Progress Notes (Signed)
Subjective:    Patient ID: Jill Torres, female    DOB: 1951-05-28, 63 y.o.   MRN: [id number]  HPI 63 year old female with past history of GERD, allergies and hypercholesterolemia who comes in today for a scheduled follow up.  Was previously having problems with increased stress and anxiety.  Was started on prozac.  Working well for her.  Feels she is handling things relatively well.  Rarely uses the xanax.  Trying to stay active.  Breathing stable.  No acid reflux.  No nausea or vomiting.  Bowels stable.  Reports previously having increased drainage.  Is taking tylenol sinus and zyrtec prn.  Helps.  She does still report some itching and irritation in her ear.  No pain.  Left ear feels dry.      Past Medical History  Diagnosis Date  . Unspecified hypothyroidism   . Irritable bowel syndrome   . Unspecified essential hypertension   . Diverticulosis of colon (without mention of hemorrhage)   . Arthritis   . Allergy   . GERD (gastroesophageal reflux disease)   . History of chicken pox   . History of shingles may 2013    Current Outpatient Prescriptions on File Prior to Visit  Medication Sig Dispense Refill  . bifidobacterium infantis (ALIGN) capsule Take 1 capsule by mouth daily.        . calcium-vitamin D (OSCAL WITH D 500-200) 500-200 MG-UNIT per tablet Take 1 tablet by mouth daily.        . cetirizine (ZYRTEC) 10 MG chewable tablet Chew 10 mg by mouth daily.       . fish oil-omega-3 fatty acids 1000 MG capsule Take 2 g by mouth daily.      Marland Kitchen FLUoxetine (PROZAC) 10 MG capsule TAKE 1 CAPSULE BY MOUTH DAILY.  30 capsule  0  . ibuprofen (ADVIL,MOTRIN) 200 MG tablet Take 200 mg by mouth as directed.        . multivitamin (THERAGRAN) per tablet Take 1 tablet by mouth daily.        . polyethylene glycol (MIRALAX) powder Take 17 g by mouth as needed.        No current facility-administered medications on file prior to visit.    Review of Systems Patient denies any headache,  lightheadedness or dizziness.  Left ear dry with some irritation.  No pain.  Breathing stable.   No cough or congestion.  No nausea or vomiting.  No acid reflux.  No abdominal pain or cramping.  No bowel change, such as diarrhea, constipation, BRBPR or melana.  No urine change.  Taking the prozac.  Helping.  Does not feel she needs a higher dose.  Rarely takes xanax.         Objective:   Physical Exam  Filed Vitals:   05/02/14 1454  BP: 130/70  Pulse: 72  Temp: 98.3 F (63.62 C)   63 year old female in no acute distress.   HEENT:  Nares- clear.  Oropharynx - without lesions.  Left ear canal with minima erythema.  TMs visualized appear to be clear.   NECK:  Supple.  Nontender.  Right carotid bruit.   HEART:  Appears to be regular. LUNGS:  No crackles or wheezing audible.  Respirations even and unlabored.  RADIAL PULSE:  Equal bilaterally.   ABDOMEN:  Soft, nontender.  Bowel sounds present and normal.  No audible abdominal bruit.    EXTREMITIES:  No increased edema present.  DP pulses palpable and equal bilaterally.  Assessment & Plan:  CARDIOLOGY.  Recent stress test negative for ischemia.  Currently doing well.    LEUKOCYTOSIS.  Noted in ER.  On recheck - normal.   RENAL INSUFFICIENCY.  Stay hydrated.  Cr 1.0 in 8/14.      HEALTH MAINTENANCE.  Physical 08/16/13.   Colonoscopy 2010.  States she is due 2020.  Is s/p hysterectomy and does not need yearly pap smears.  IFOB given.  Schedule mammogram.    I spent 25 minutes with the patient and more than 50% of the time was spent in consultation regarding the above.

## 2014-05-02 NOTE — Progress Notes (Signed)
Pre visit review using our clinic review tool, if applicable. No additional management support is needed unless otherwise documented below in the visit note. 

## 2014-05-10 ENCOUNTER — Encounter: Payer: Self-pay | Admitting: Internal Medicine

## 2014-05-10 DIAGNOSIS — H938X9 Other specified disorders of ear, unspecified ear: Secondary | ICD-10-CM | POA: Insufficient documentation

## 2014-05-10 NOTE — Assessment & Plan Note (Signed)
On Prozac 10mg q day.  Rarely uses her xanax.  Doing better.  Follow closely.    

## 2014-05-10 NOTE — Assessment & Plan Note (Signed)
States had colonoscopy 2010 and is due f/u 220.  Needs to return IFOB.

## 2014-05-10 NOTE — Assessment & Plan Note (Signed)
No reported problems today.   Follow.   

## 2014-05-10 NOTE — Assessment & Plan Note (Signed)
Carotid ultrasound 08/26/13 - no significant stenosis.    

## 2014-05-10 NOTE — Assessment & Plan Note (Signed)
Cortisporin otic as directed.  TMs clear.  Follow.

## 2014-05-10 NOTE — Assessment & Plan Note (Signed)
Bowels stable.  Follow.   

## 2014-05-10 NOTE — Assessment & Plan Note (Signed)
Blood pressure as outlined.  Blood pressure ok today.  Follow.

## 2014-05-10 NOTE — Assessment & Plan Note (Signed)
Was on medication and followed by Dr Moryati.  On no medication now.  States was stopped by MD.  Follow tsh.   

## 2014-05-14 ENCOUNTER — Encounter: Payer: Self-pay | Admitting: Adult Health

## 2014-05-14 ENCOUNTER — Ambulatory Visit (INDEPENDENT_AMBULATORY_CARE_PROVIDER_SITE_OTHER): Payer: BC Managed Care – PPO | Admitting: Adult Health

## 2014-05-14 VITALS — BP 134/72 | HR 74 | Temp 98.3°F | Resp 12 | Wt 176.0 lb

## 2014-05-14 DIAGNOSIS — N390 Urinary tract infection, site not specified: Secondary | ICD-10-CM

## 2014-05-14 DIAGNOSIS — R5381 Other malaise: Secondary | ICD-10-CM

## 2014-05-14 DIAGNOSIS — R5383 Other fatigue: Secondary | ICD-10-CM

## 2014-05-14 LAB — POCT URINALYSIS DIPSTICK
BILIRUBIN UA: NEGATIVE
Glucose, UA: NEGATIVE
Ketones, UA: NEGATIVE
Leukocytes, UA: NEGATIVE
Nitrite, UA: NEGATIVE
Protein, UA: NEGATIVE
Spec Grav, UA: <=1.005
Urobilinogen, UA: 0.2
pH, UA: 7

## 2014-05-14 LAB — CBC WITH DIFFERENTIAL/PLATELET
BASOS ABS: 0.1 10*3/uL (ref 0.0–0.1)
Basophils Relative: 0.7 % (ref 0.0–3.0)
EOS ABS: 0.1 10*3/uL (ref 0.0–0.7)
Eosinophils Relative: 1.1 % (ref 0.0–5.0)
HCT: 40.2 % (ref 36.0–46.0)
HEMOGLOBIN: 13.2 g/dL (ref 12.0–15.0)
LYMPHS PCT: 35.4 % (ref 12.0–46.0)
Lymphs Abs: 3.2 10*3/uL (ref 0.7–4.0)
MCHC: 32.8 g/dL (ref 30.0–36.0)
MCV: 89.9 fl (ref 78.0–100.0)
MONOS PCT: 5.6 % (ref 3.0–12.0)
Monocytes Absolute: 0.5 10*3/uL (ref 0.1–1.0)
Neutro Abs: 5.2 10*3/uL (ref 1.4–7.7)
Neutrophils Relative %: 57.2 % (ref 43.0–77.0)
PLATELETS: 315 10*3/uL (ref 150.0–400.0)
RBC: 4.47 Mil/uL (ref 3.87–5.11)
RDW: 13.1 % (ref 11.5–15.5)
WBC: 9 10*3/uL (ref 4.0–10.5)

## 2014-05-14 LAB — TSH: TSH: 0.85 u[IU]/mL (ref 0.35–4.50)

## 2014-05-14 MED ORDER — PHENAZOPYRIDINE HCL 200 MG PO TABS: 200.0000 mg | ORAL_TABLET | Freq: Three times a day (TID) | ORAL | Status: DC | PRN

## 2014-05-14 MED ORDER — CIPROFLOXACIN HCL 250 MG PO TABS: 250.0000 mg | ORAL_TABLET | Freq: Two times a day (BID) | ORAL | Status: DC

## 2014-05-14 NOTE — Progress Notes (Signed)
Pre visit review using our clinic review tool, if applicable. No additional management support is needed unless otherwise documented below in the visit note. 

## 2014-05-14 NOTE — Progress Notes (Signed)
Patient ID: Jill Torres, female   DOB: 1951-08-23, 63 y.o.   MRN: [id number]   Subjective:    Patient ID: Jill Torres, female    DOB: May 17, 1951, 63 y.o.   MRN: [id number]  HPI  Pt is a pleasant 63 y/o female who presents to clinic with fatigue x 1 week. She reports that she could sleep 8 hours straight and feel like she can go right back to sleep. She denies any respiratory symptoms or recent illness. She denies insect bites. Denies chest pain, shortness of breath, abdominal pain, rectal bleeding. She is having some urinary symptoms of frequency and feels that she is not completely emptying her bladder. She is scheduled to have routine labs drawn this Friday.   Past Medical History  Diagnosis Date  . Unspecified hypothyroidism   . Irritable bowel syndrome   . Unspecified essential hypertension   . Diverticulosis of colon (without mention of hemorrhage)   . Arthritis   . Allergy   . GERD (gastroesophageal reflux disease)   . History of chicken pox   . History of shingles may 2013    Current Outpatient Prescriptions on File Prior to Visit  Medication Sig Dispense Refill  . bifidobacterium infantis (ALIGN) capsule Take 1 capsule by mouth daily.        . calcium-vitamin D (OSCAL WITH D 500-200) 500-200 MG-UNIT per tablet Take 1 tablet by mouth daily.        . cetirizine (ZYRTEC) 10 MG chewable tablet Chew 10 mg by mouth daily.       . fish oil-omega-3 fatty acids 1000 MG capsule Take 2 g by mouth daily.      Marland Kitchen FLUoxetine (PROZAC) 10 MG capsule TAKE 1 CAPSULE BY MOUTH DAILY.  30 capsule  0  . ibuprofen (ADVIL,MOTRIN) 200 MG tablet Take 200 mg by mouth as directed.        . multivitamin (THERAGRAN) per tablet Take 1 tablet by mouth daily.        . NEOMYCIN-POLYMYXIN-HYDROCORTISONE (CORTISPORIN) 1 % SOLN otic solution Place 3 drops into the left ear every 8 (eight) hours.  10 mL  0  . polyethylene glycol (MIRALAX) powder Take 17 g by mouth as needed.        No current  facility-administered medications on file prior to visit.    Review of Systems  Constitutional: Positive for fatigue. Negative for fever and chills.  HENT: Negative.   Eyes: Negative.   Respiratory: Negative.   Cardiovascular: Negative.   Gastrointestinal: Negative.   Endocrine: Negative.   Genitourinary: Positive for urgency and frequency. Negative for dysuria, hematuria, flank pain and difficulty urinating.  Musculoskeletal: Negative.   Skin: Negative.   Allergic/Immunologic: Negative.   Neurological: Negative.  Negative for weakness.  Hematological: Negative.   Psychiatric/Behavioral: Negative.        Objective:  BP 134/72  Pulse 74  Temp(Src) 98.3 F (36.8 C) (Oral)  Resp 12  Wt 176 lb (79.833 kg)  SpO2 99%  LMP 12/12/1981   Physical Exam  Constitutional: She is oriented to person, place, and time. She appears well-developed and well-nourished. No distress.  Well groomed  HENT:  Head: Normocephalic and atraumatic.  Eyes: Conjunctivae and EOM are normal.  Neck: Normal range of motion. Neck supple.  Cardiovascular: Normal rate, regular rhythm, normal heart sounds and intact distal pulses.  Exam reveals no gallop and no friction rub.   No murmur heard. Pulmonary/Chest: Effort normal and breath sounds normal. No respiratory distress. She has no  wheezes. She has no rales.  Abdominal: Soft. Bowel sounds are normal. She exhibits no distension and no mass. There is tenderness (pressure over bladder area). There is no rebound and no guarding.  Genitourinary:  Mild suprapubic tenderness with palpation  Musculoskeletal: Normal range of motion.  Lymphadenopathy:    She has no cervical adenopathy.  Neurological: She is alert and oriented to person, place, and time. She has normal reflexes. Coordination normal.  Skin: Skin is warm and dry.  Psychiatric: She has a normal mood and affect. Her behavior is normal. Judgment and thought content normal.      Assessment & Plan:    1. UTI (urinary tract infection) UA does not show any nitrites or leukocytes. Trace blood. Send for culture. Start Cipro as directed - POCT urinalysis dipstick - Urine culture  2. Fatigue Will check thyroid and cbc. She was having these test done as part of her routine labs on Friday. I have cancelled these for Friday so that I can check them today.  - TSH - CBC with Differential

## 2014-05-14 NOTE — Patient Instructions (Addendum)
  Please have your labs drawn prior to leaving the office.  I am sending the urine for culture.  We will notify you of results once they are available.  Start cipro twice a day for 3 days.  Pyridium for urinary discomfort.  Call if no improvement of symptoms.

## 2014-05-16 ENCOUNTER — Encounter: Payer: Self-pay | Admitting: Adult Health

## 2014-05-16 ENCOUNTER — Other Ambulatory Visit (INDEPENDENT_AMBULATORY_CARE_PROVIDER_SITE_OTHER): Payer: BC Managed Care – PPO

## 2014-05-16 DIAGNOSIS — E7800 Pure hypercholesterolemia, unspecified: Secondary | ICD-10-CM

## 2014-05-16 DIAGNOSIS — F419 Anxiety disorder, unspecified: Secondary | ICD-10-CM

## 2014-05-16 DIAGNOSIS — F411 Generalized anxiety disorder: Secondary | ICD-10-CM

## 2014-05-16 LAB — COMPREHENSIVE METABOLIC PANEL
ALBUMIN: 3.7 g/dL (ref 3.5–5.2)
ALT: 24 U/L (ref 0–35)
AST: 23 U/L (ref 0–37)
Alkaline Phosphatase: 61 U/L (ref 39–117)
BUN: 13 mg/dL (ref 6–23)
CO2: 28 meq/L (ref 19–32)
CREATININE: 1 mg/dL (ref 0.4–1.2)
Calcium: 9.5 mg/dL (ref 8.4–10.5)
Chloride: 108 mEq/L (ref 96–112)
GFR: 61.16 mL/min (ref >60.00)
Glucose, Bld: 86 mg/dL (ref 70–99)
POTASSIUM: 4.9 meq/L (ref 3.5–5.1)
SODIUM: 142 meq/L (ref 135–145)
Total Bilirubin: 0.8 mg/dL (ref 0.2–1.2)
Total Protein: 6.8 g/dL (ref 6.0–8.3)

## 2014-05-16 LAB — LIPID PANEL
CHOLESTEROL: 229 mg/dL — AB (ref 0–200)
HDL: 53.2 mg/dL (ref >39.00)
LDL Cholesterol: 151 mg/dL — ABNORMAL HIGH (ref 0–99)
NonHDL: 175.8
TRIGLYCERIDES: 123 mg/dL (ref 0.0–149.0)
Total CHOL/HDL Ratio: 4
VLDL: 24.6 mg/dL (ref 0.0–40.0)

## 2014-05-16 LAB — URINE CULTURE
Colony Count: NO GROWTH
ORGANISM ID, BACTERIA: NO GROWTH

## 2014-05-19 ENCOUNTER — Encounter: Payer: Self-pay | Admitting: Internal Medicine

## 2014-05-19 ENCOUNTER — Telehealth: Payer: Self-pay | Admitting: Internal Medicine

## 2014-05-19 DIAGNOSIS — E7800 Pure hypercholesterolemia, unspecified: Secondary | ICD-10-CM

## 2014-05-19 MED ORDER — FLUOXETINE HCL 10 MG PO CAPS: ORAL_CAPSULE | ORAL | Status: DC

## 2014-05-19 MED ORDER — PRAVASTATIN SODIUM 10 MG PO TABS: 10.0000 mg | ORAL_TABLET | Freq: Every day | ORAL | Status: DC

## 2014-05-19 NOTE — Telephone Encounter (Signed)
Pt was informed she needed a non fasting lab appt in 6 weeks.  Please schedule and contact her with an appt date and time.  Thanks    Dr Nicki Reaper

## 2014-05-19 NOTE — Telephone Encounter (Signed)
Refilled prozac 10mg  #30 with 2 refills.  Sent in rx for pravastatin 10mg  #30 with 2 refills.

## 2014-05-20 NOTE — Telephone Encounter (Signed)
Lab appt scheduled. Patient aware of date and time. msn

## 2014-07-01 ENCOUNTER — Other Ambulatory Visit: Payer: BC Managed Care – PPO

## 2014-07-07 ENCOUNTER — Other Ambulatory Visit (INDEPENDENT_AMBULATORY_CARE_PROVIDER_SITE_OTHER): Payer: BC Managed Care – PPO

## 2014-07-07 DIAGNOSIS — E7800 Pure hypercholesterolemia, unspecified: Secondary | ICD-10-CM

## 2014-07-07 LAB — HEPATIC FUNCTION PANEL
ALBUMIN: 3.6 g/dL (ref 3.5–5.2)
ALK PHOS: 64 U/L (ref 39–117)
ALT: 19 U/L (ref 0–35)
AST: 19 U/L (ref 0–37)
Bilirubin, Direct: 0.1 mg/dL (ref 0.0–0.3)
Total Bilirubin: 0.5 mg/dL (ref 0.2–1.2)
Total Protein: 6.8 g/dL (ref 6.0–8.3)

## 2014-07-08 ENCOUNTER — Encounter: Payer: Self-pay | Admitting: Internal Medicine

## 2014-07-10 NOTE — Telephone Encounter (Signed)
Unread mychart message mailed to patient 

## 2014-08-11 ENCOUNTER — Other Ambulatory Visit: Payer: Self-pay | Admitting: Internal Medicine

## 2014-08-12 ENCOUNTER — Other Ambulatory Visit: Payer: Self-pay | Admitting: Internal Medicine

## 2014-08-19 IMAGING — MG MM CAD SCREENING MAMMO
1 series · 4 of 4 positions shown · non-contrast
Comparison: none

REASON FOR EXAM: SCR MAMMO NO ORDER
COMMENTS:

PROCEDURE:     MAM - MAM DGTL SCRN MAM NO ORDER W/CAD  - July 16, 2013  [DATE]
RESULT:     Bilateral digital screening mammogram with CAD dated 07/16/2013
Comparison study/studies dated: 07/12/2011, 07/07/2010, 07/06/2009, 07/03/2008

[R CC · right · 4 of 4 slices shown]
[im 1/4]
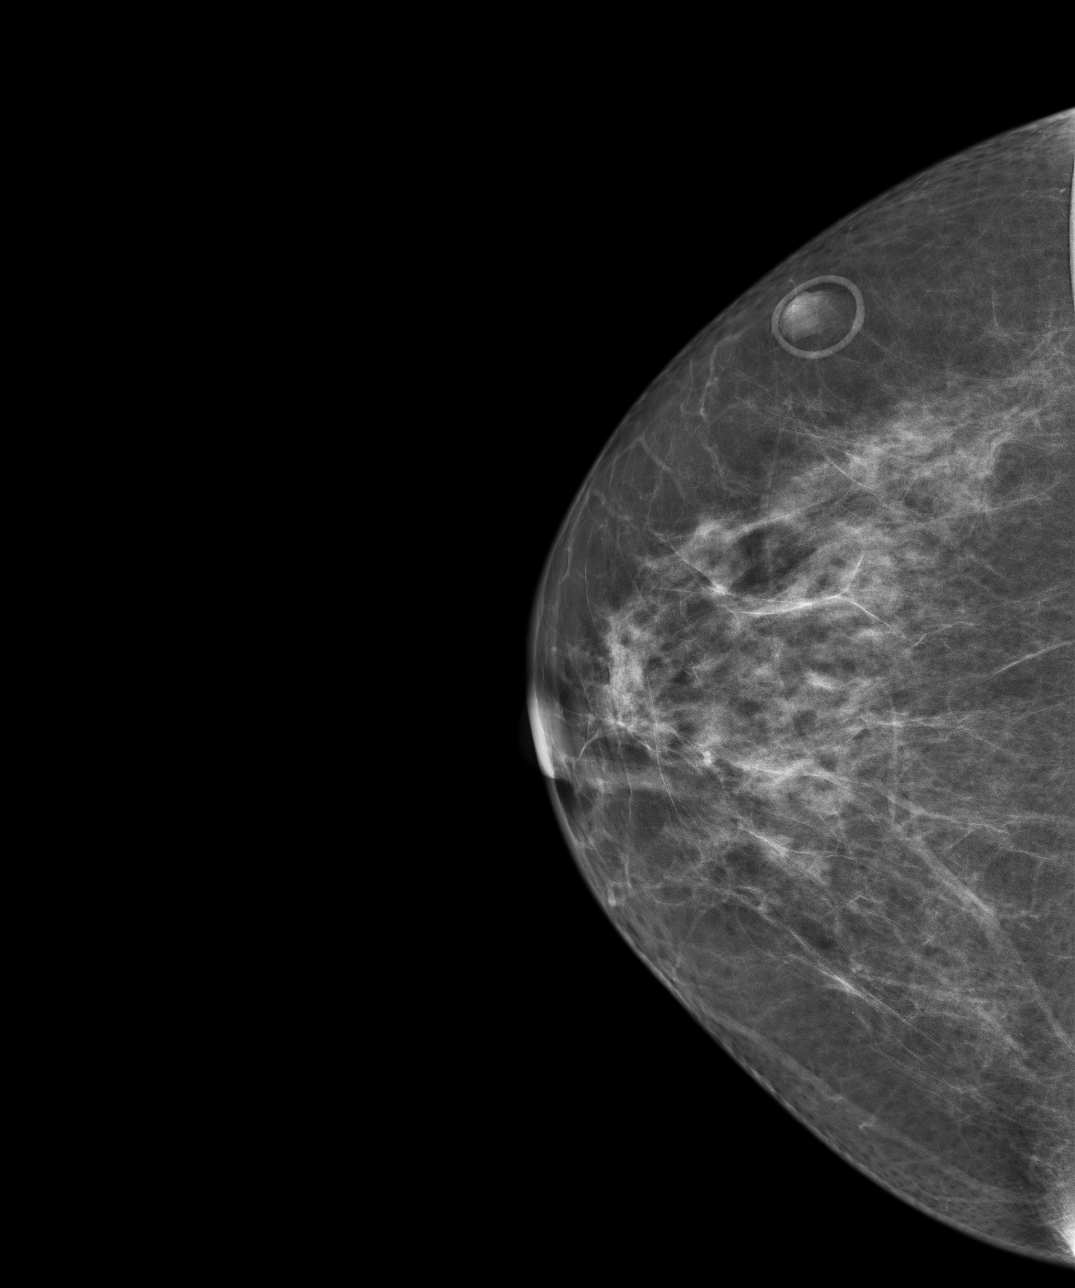
[im 2/4]
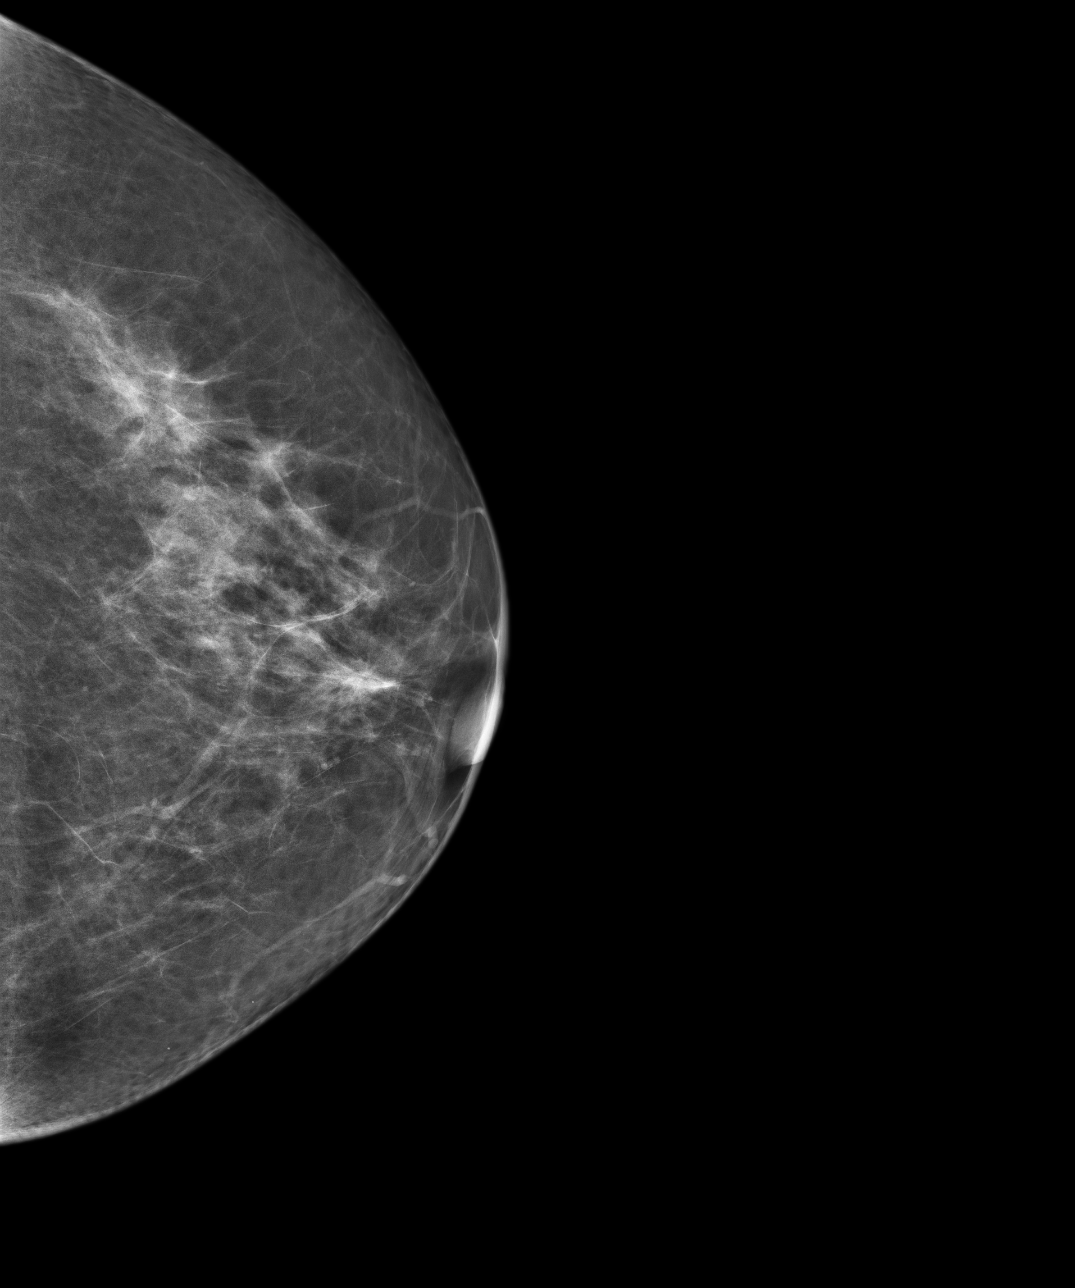
[im 3/4]
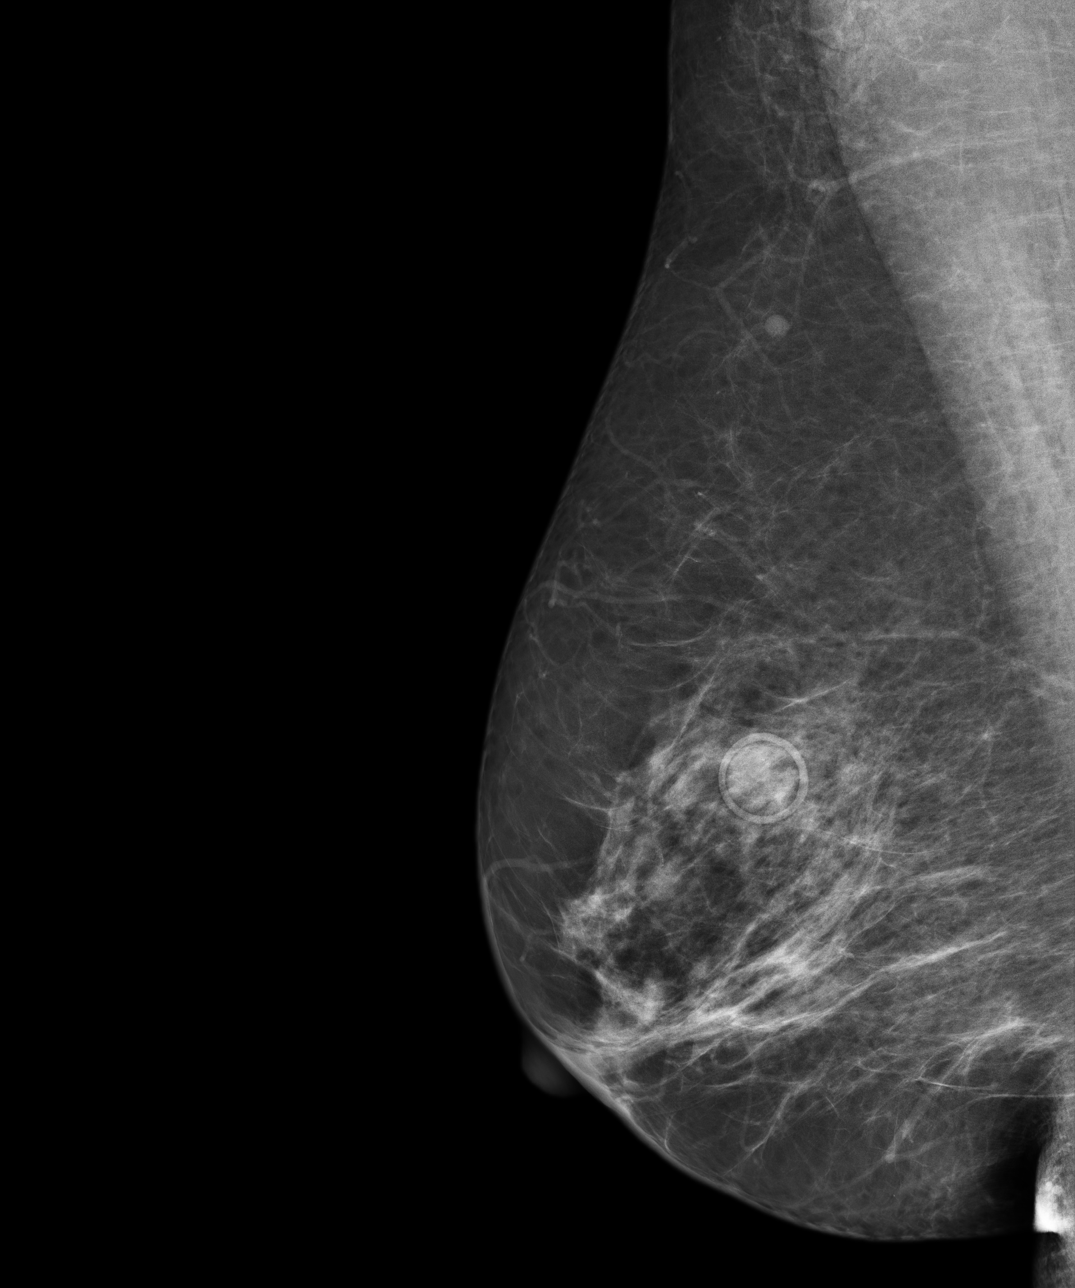
[im 4/4]
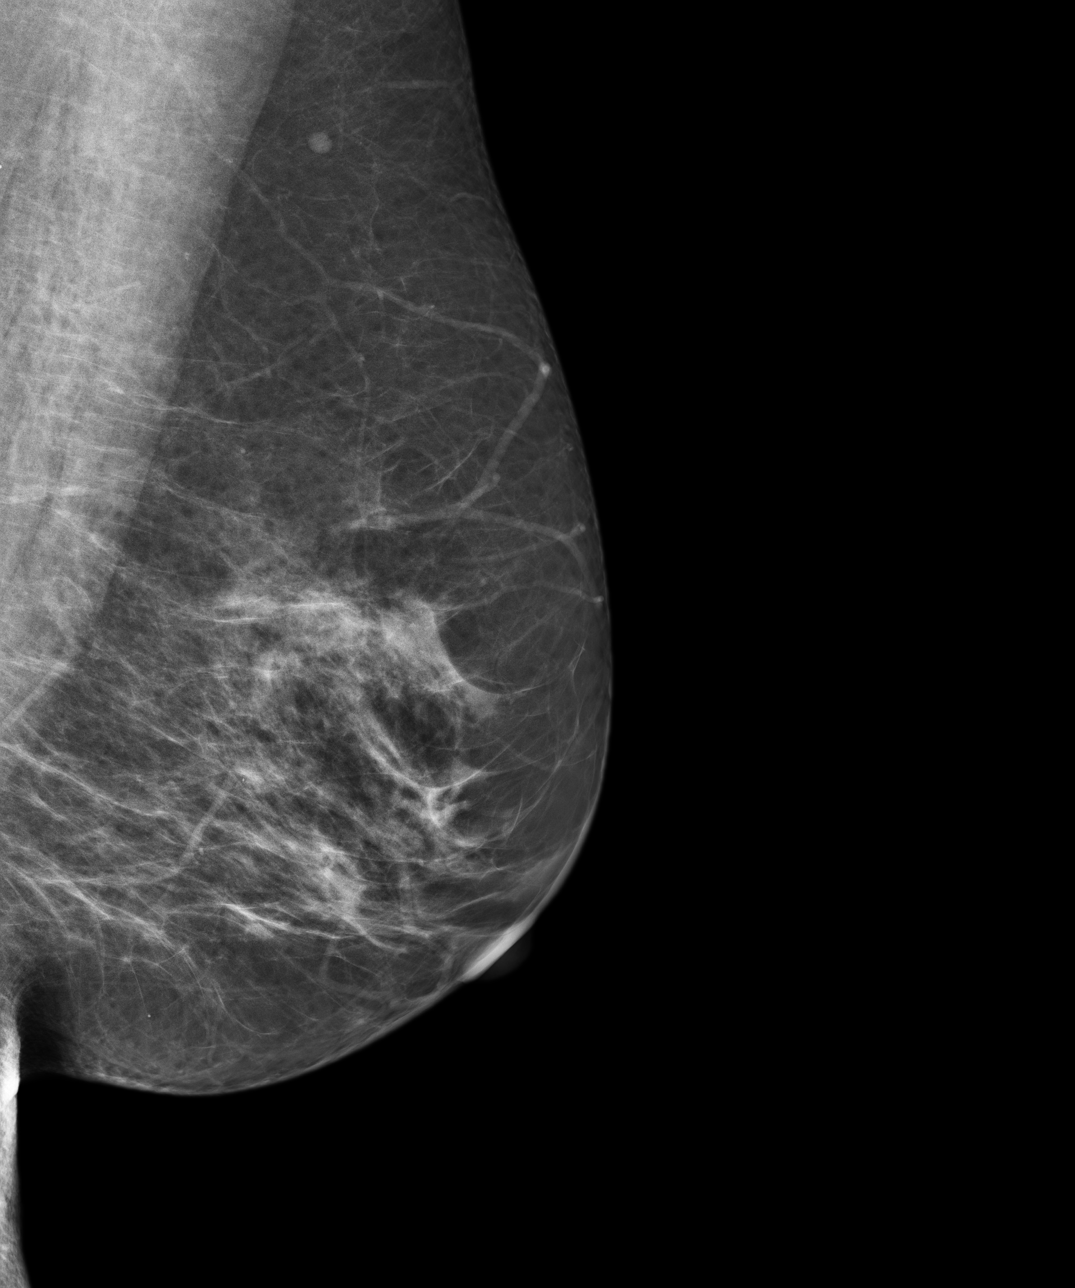

[4 of 4 positions shown; findings below may reference images not displayed]

FINDINGS: BREAST COMPOSITION: The breast composition is SCATTERED FIBROGLANDULAR
TISSUE (glandular tissue is 25-50%)

There is no radiographic evidence of malignant type calcifications ,
architectural distortion, spiculated masses or nodules.
IMPRESSION: BI-RADS: Category 2- Benign Finding

A NEGATIVE MAMMOGRAM REPORT DOES NOT PRECLUDE BIOPSY OR OTHER EVALUATION OF
A CLINICALLY PALPABLE OR OTHERWISE SUSPICIOUS MASS OR LESION. BREAST CANCER
MAY NOT BE DETECTED IN UP TO 10% OF CASES.

## 2014-09-02 ENCOUNTER — Encounter: Payer: Self-pay | Admitting: Internal Medicine

## 2014-09-02 ENCOUNTER — Ambulatory Visit (INDEPENDENT_AMBULATORY_CARE_PROVIDER_SITE_OTHER): Payer: BC Managed Care – PPO | Admitting: Internal Medicine

## 2014-09-02 VITALS — BP 136/74 | HR 71 | Temp 98.1°F | Ht 64.0 in | Wt 183.2 lb

## 2014-09-02 DIAGNOSIS — R0989 Other specified symptoms and signs involving the circulatory and respiratory systems: Secondary | ICD-10-CM

## 2014-09-02 DIAGNOSIS — Z9109 Other allergy status, other than to drugs and biological substances: Secondary | ICD-10-CM

## 2014-09-02 DIAGNOSIS — F411 Generalized anxiety disorder: Secondary | ICD-10-CM

## 2014-09-02 DIAGNOSIS — E7800 Pure hypercholesterolemia, unspecified: Secondary | ICD-10-CM

## 2014-09-02 DIAGNOSIS — Z1211 Encounter for screening for malignant neoplasm of colon: Secondary | ICD-10-CM

## 2014-09-02 DIAGNOSIS — E538 Deficiency of other specified B group vitamins: Secondary | ICD-10-CM

## 2014-09-02 DIAGNOSIS — Z86010 Personal history of colonic polyps: Secondary | ICD-10-CM

## 2014-09-02 DIAGNOSIS — K589 Irritable bowel syndrome without diarrhea: Secondary | ICD-10-CM

## 2014-09-02 DIAGNOSIS — R030 Elevated blood-pressure reading, without diagnosis of hypertension: Secondary | ICD-10-CM

## 2014-09-02 DIAGNOSIS — E039 Hypothyroidism, unspecified: Secondary | ICD-10-CM

## 2014-09-02 DIAGNOSIS — H938X9 Other specified disorders of ear, unspecified ear: Secondary | ICD-10-CM

## 2014-09-02 DIAGNOSIS — F419 Anxiety disorder, unspecified: Secondary | ICD-10-CM

## 2014-09-02 DIAGNOSIS — K219 Gastro-esophageal reflux disease without esophagitis: Secondary | ICD-10-CM

## 2014-09-02 DIAGNOSIS — K5730 Diverticulosis of large intestine without perforation or abscess without bleeding: Secondary | ICD-10-CM

## 2014-09-02 DIAGNOSIS — IMO0001 Reserved for inherently not codable concepts without codable children: Secondary | ICD-10-CM

## 2014-09-02 LAB — LIPID PANEL
CHOL/HDL RATIO: 4
Cholesterol: 203 mg/dL — ABNORMAL HIGH (ref 0–200)
HDL: 55.4 mg/dL (ref >39.00)
LDL CALC: 119 mg/dL — AB (ref 0–99)
NONHDL: 147.6
Triglycerides: 145 mg/dL (ref 0.0–149.0)
VLDL: 29 mg/dL (ref 0.0–40.0)

## 2014-09-02 LAB — COMPREHENSIVE METABOLIC PANEL
ALK PHOS: 67 U/L (ref 39–117)
ALT: 21 U/L (ref 0–35)
AST: 19 U/L (ref 0–37)
Albumin: 4 g/dL (ref 3.5–5.2)
BILIRUBIN TOTAL: 0.8 mg/dL (ref 0.2–1.2)
BUN: 14 mg/dL (ref 6–23)
CO2: 26 mEq/L (ref 19–32)
Calcium: 9.3 mg/dL (ref 8.4–10.5)
Chloride: 105 mEq/L (ref 96–112)
Creatinine, Ser: 1 mg/dL (ref 0.4–1.2)
GFR: 58.34 mL/min — ABNORMAL LOW (ref >60.00)
Glucose, Bld: 83 mg/dL (ref 70–99)
Potassium: 4.4 mEq/L (ref 3.5–5.1)
Sodium: 137 mEq/L (ref 135–145)
TOTAL PROTEIN: 7.4 g/dL (ref 6.0–8.3)

## 2014-09-02 LAB — VITAMIN B12: VITAMIN B 12: 534 pg/mL (ref 211–911)

## 2014-09-02 MED ORDER — NEOMYCIN-POLYMYXIN-HC 1 % OT SOLN: 3.0000 [drp] | Freq: Three times a day (TID) | OTIC | Status: DC

## 2014-09-02 NOTE — Progress Notes (Signed)
Pre visit review using our clinic review tool, if applicable. No additional management support is needed unless otherwise documented below in the visit note. 

## 2014-09-02 NOTE — Progress Notes (Signed)
Subjective:    Patient ID: Jill Torres, female    DOB: 08-24-1951, 63 y.o.   MRN: [id number]  HPI 63 year old female with past history of GERD, allergies and hypercholesterolemia who comes in today to follow up on these issues as well as for a complete physical exam.  Was previously having problems with increased stress and anxiety.  Was started on prozac.  Appears to be doing well.   Trying to stay active.  Breathing stable.  No acid reflux.  No nausea or vomiting.  Bowels stable.  She is having allergy issues.  Eyes watering.  Dripping and draining of her nose.  Taking zyrtec, nasacort and tylenol sinus.  Does help some.  Has been to ENT.  Previously received allergy shots.  Overall feels stable.  States her blood pressure averages 120s/70s.     Past Medical History  Diagnosis Date  . Unspecified hypothyroidism   . Irritable bowel syndrome   . Unspecified essential hypertension   . Diverticulosis of colon (without mention of hemorrhage)   . Arthritis   . Allergy   . GERD (gastroesophageal reflux disease)   . History of chicken pox   . History of shingles may 2013    Current Outpatient Prescriptions on File Prior to Visit  Medication Sig Dispense Refill  . bifidobacterium infantis (ALIGN) capsule Take 1 capsule by mouth daily.        . calcium-vitamin D (OSCAL WITH D 500-200) 500-200 MG-UNIT per tablet Take 1 tablet by mouth daily.        . cetirizine (ZYRTEC) 10 MG chewable tablet Chew 10 mg by mouth daily.       . fish oil-omega-3 fatty acids 1000 MG capsule Take 2 g by mouth daily.      Marland Kitchen ibuprofen (ADVIL,MOTRIN) 200 MG tablet Take 200 mg by mouth as directed.        . multivitamin (THERAGRAN) per tablet Take 1 tablet by mouth daily.        . NEOMYCIN-POLYMYXIN-HYDROCORTISONE (CORTISPORIN) 1 % SOLN otic solution Place 3 drops into the left ear every 8 (eight) hours.  10 mL  0  . polyethylene glycol (MIRALAX) powder Take 17 g by mouth as needed.       . pravastatin (PRAVACHOL)  10 MG tablet TAKE 1 TABLET (10 MG TOTAL) BY MOUTH DAILY.  30 tablet  2   No current facility-administered medications on file prior to visit.    Review of Systems Patient denies any headache, lightheadedness or dizziness.  Breathing stable.  Allergy symptoms as outlined.  No cough or congestion.  No nausea or vomiting.  No acid reflux.  No abdominal pain or cramping.  No bowel change, such as diarrhea, constipation, BRBPR or melana.  No urine change.  Taking prozac.  Stable.  Blood pressures as outlined.          Objective:   Physical Exam  Filed Vitals:   09/02/14 0842  Pulse: 71  Temp: 98.1 F (36.7 C)   Blood pressure recheck:  21/39  63 year old female in no acute distress.   HEENT:  Nares- clear.  Oropharynx - without lesions. NECK:  Supple.  Nontender.   HEART:  Appears to be regular. LUNGS:  No crackles or wheezing audible.  Respirations even and unlabored.  RADIAL PULSE:  Equal bilaterally.    BREASTS:  No nipple discharge or nipple retraction present.  Could not appreciate any distinct nodules or axillary adenopathy.  ABDOMEN:  Soft, nontender.  Bowel sounds present and normal.  No audible abdominal bruit.  GU:  Not performed.    EXTREMITIES:  No increased edema present.  DP pulses palpable and equal bilaterally.          Assessment & Plan:  CARDIOLOGY.  Stress test negative for ischemia.  Currently doing well.    RENAL INSUFFICIENCY.  Stay hydrated.  Follow renal function.     PREVIOUS B12 DEFICIENCY.  Check B12 level.     HEALTH MAINTENANCE.  Physical today.   Colonoscopy 2010.  States she is due 2020.  IFOB given today.  Is s/p hysterectomy and does not need yearly pap smears.  She is scheduled for her mammogram next week.    I spent 25 minutes with the patient and more than 50% of the time was spent in consultation regarding the above.

## 2014-09-03 ENCOUNTER — Encounter: Payer: Self-pay | Admitting: Internal Medicine

## 2014-09-07 ENCOUNTER — Encounter: Payer: Self-pay | Admitting: Internal Medicine

## 2014-09-07 DIAGNOSIS — Z9109 Other allergy status, other than to drugs and biological substances: Secondary | ICD-10-CM | POA: Insufficient documentation

## 2014-09-07 NOTE — Assessment & Plan Note (Signed)
No reported problems today.   Follow.

## 2014-09-07 NOTE — Assessment & Plan Note (Signed)
Was on medication and followed by Dr Carmela Rima.  On no medication now.  States was stopped by MD.  Follow tsh.

## 2014-09-07 NOTE — Assessment & Plan Note (Signed)
Bowels stable.  Follow.   

## 2014-09-07 NOTE — Assessment & Plan Note (Signed)
On zyrtec.  Using nasal spray.  Feels stable.  Follow.

## 2014-09-07 NOTE — Assessment & Plan Note (Signed)
Refill cortisporin otic.  Does not use regularly.  Just with flare.

## 2014-09-07 NOTE — Assessment & Plan Note (Signed)
On Prozac 10mg  q day.  Rarely uses her xanax.  Doing better.  Follow closely.

## 2014-09-07 NOTE — Assessment & Plan Note (Signed)
Carotid ultrasound 08/26/13 - no significant stenosis.

## 2014-09-07 NOTE — Assessment & Plan Note (Signed)
Blood pressures on her checks averaging 120s/70s.  Appears to be doing well.  Follow.

## 2014-09-07 NOTE — Assessment & Plan Note (Signed)
States had colonoscopy 2010 and is due f/u 220.   IFOB given.

## 2014-09-07 NOTE — Assessment & Plan Note (Signed)
Bowels stable.  

## 2014-09-09 ENCOUNTER — Ambulatory Visit: Payer: Self-pay | Admitting: Internal Medicine

## 2014-09-09 LAB — HM MAMMOGRAPHY: HM Mammogram: NEGATIVE

## 2014-09-10 ENCOUNTER — Encounter: Payer: Self-pay | Admitting: *Deleted

## 2014-10-03 ENCOUNTER — Other Ambulatory Visit (INDEPENDENT_AMBULATORY_CARE_PROVIDER_SITE_OTHER): Payer: BC Managed Care – PPO

## 2014-10-03 DIAGNOSIS — Z1211 Encounter for screening for malignant neoplasm of colon: Secondary | ICD-10-CM

## 2014-10-06 ENCOUNTER — Encounter: Payer: Self-pay | Admitting: *Deleted

## 2014-10-06 LAB — FECAL OCCULT BLOOD, IMMUNOCHEMICAL: Fecal Occult Bld: NEGATIVE

## 2014-10-15 ENCOUNTER — Encounter: Payer: Self-pay | Admitting: Internal Medicine

## 2014-10-15 ENCOUNTER — Ambulatory Visit (INDEPENDENT_AMBULATORY_CARE_PROVIDER_SITE_OTHER): Payer: BC Managed Care – PPO | Admitting: Internal Medicine

## 2014-10-15 VITALS — BP 176/95 | HR 85 | Temp 97.7°F | Ht 64.0 in | Wt 179.0 lb

## 2014-10-15 DIAGNOSIS — N390 Urinary tract infection, site not specified: Secondary | ICD-10-CM

## 2014-10-15 DIAGNOSIS — R350 Frequency of micturition: Secondary | ICD-10-CM

## 2014-10-15 LAB — POCT URINALYSIS DIPSTICK
Bilirubin, UA: NEGATIVE
Glucose, UA: NEGATIVE
KETONES UA: NEGATIVE
Leukocytes, UA: NEGATIVE
Nitrite, UA: NEGATIVE
PROTEIN UA: NEGATIVE
Urobilinogen, UA: 0.2
pH, UA: 5.5

## 2014-10-15 MED ORDER — CIPROFLOXACIN HCL 500 MG PO TABS: 500.0000 mg | ORAL_TABLET | Freq: Two times a day (BID) | ORAL | Status: DC

## 2014-10-15 NOTE — Progress Notes (Signed)
Pre visit review using our clinic review tool, if applicable. No additional management support is needed unless otherwise documented below in the visit note. 

## 2014-10-17 ENCOUNTER — Encounter: Payer: Self-pay | Admitting: Internal Medicine

## 2014-10-17 LAB — URINE CULTURE
Colony Count: NO GROWTH
Organism ID, Bacteria: NO GROWTH

## 2014-10-19 NOTE — Progress Notes (Signed)
  Subjective:    Patient ID: Jill Torres, female    DOB: [birth year], 63 y.o.   MRN: [id number]  Urinary Tract Infection   63 year old female with past history of GERD, allergies and hypercholesterolemia who comes in today as a work in with concerns regarding a possible urinary tract infection.  Symptoms started yesterday.  Describes low back pain and left lower side/abdominal pain.  Pain radiates around.  Some burning with urination.  Drinking increased fluids.  Feels similar to previous uti.  Has never had kidney stones.     Past Medical History  Diagnosis Date  . Unspecified hypothyroidism   . Irritable bowel syndrome   . Unspecified essential hypertension   . Diverticulosis of colon (without mention of hemorrhage)   . Arthritis   . Allergy   . GERD (gastroesophageal reflux disease)   . History of chicken pox   . History of shingles may 2013    Current Outpatient Prescriptions on File Prior to Visit  Medication Sig Dispense Refill  . bifidobacterium infantis (ALIGN) capsule Take 1 capsule by mouth daily.      . calcium-vitamin D (OSCAL WITH D 500-200) 500-200 MG-UNIT per tablet Take 1 tablet by mouth daily.      . cetirizine (ZYRTEC) 10 MG chewable tablet Chew 10 mg by mouth daily.     . Coenzyme Q10 (COQ10 PO) Take by mouth daily.    . fish oil-omega-3 fatty acids 1000 MG capsule Take 2 g by mouth daily.    Marland Kitchen FLUoxetine (PROZAC) 10 MG capsule TAKE 1 CAPSULE BY MOUTH DAILY as needed    . ibuprofen (ADVIL,MOTRIN) 200 MG tablet Take 200 mg by mouth as directed.      . multivitamin (THERAGRAN) per tablet Take 1 tablet by mouth daily.      . NEOMYCIN-POLYMYXIN-HYDROCORTISONE (CORTISPORIN) 1 % SOLN otic solution Place 3 drops into the left ear every 8 (eight) hours. 10 mL 0  . polyethylene glycol (MIRALAX) powder Take 17 g by mouth as needed.     . pravastatin (PRAVACHOL) 10 MG tablet TAKE 1 TABLET (10 MG TOTAL) BY MOUTH DAILY. 30 tablet 2   No current facility-administered  medications on file prior to visit.    Review of Systems No fever.  No nausea or vomiting.  Lower abdominal pain - left and left flank pain.  Left lower back pain.  Some burning.  No vaginal symptoms.  No hematuria.  No history of kidney stones.  Has not taken any medication.          Objective:   Physical Exam  Filed Vitals:   10/15/14 1221  BP: 176/95  Pulse: 85  Temp: 97.7 F (29.73 C)   63 year old female in no acute distress.  HEART:  Appears to be regular. LUNGS:  No crackles or wheezing audible.  Respirations even and unlabored.  ABDOMEN:  Soft, nontender.  Bowel sounds present and normal.  No audible abdominal bruit.  No reproducible pain to palpation.  BACK:  Non tender.  No CVA tenderness.            Assessment & Plan:  1. Frequency of urination - POCT Urinalysis Dipstick - Urine culture  2. UTI (lower urinary tract infection) Trace blood on urine dip.  Send for culture.  Treat with cipro as directed.  Strain urine.  Follow.  Notify me if symptoms worsen or do not improve.

## 2014-11-08 ENCOUNTER — Other Ambulatory Visit: Payer: Self-pay | Admitting: Internal Medicine

## 2014-11-10 ENCOUNTER — Other Ambulatory Visit: Payer: Self-pay | Admitting: Internal Medicine

## 2014-12-25 ENCOUNTER — Encounter: Payer: Self-pay | Admitting: Internal Medicine

## 2014-12-26 ENCOUNTER — Ambulatory Visit (INDEPENDENT_AMBULATORY_CARE_PROVIDER_SITE_OTHER): Payer: BLUE CROSS/BLUE SHIELD | Admitting: Internal Medicine

## 2014-12-26 ENCOUNTER — Encounter: Payer: Self-pay | Admitting: Internal Medicine

## 2014-12-26 VITALS — BP 140/80 | HR 93 | Temp 98.3°F | Ht 64.0 in | Wt 181.5 lb

## 2014-12-26 DIAGNOSIS — J019 Acute sinusitis, unspecified: Secondary | ICD-10-CM

## 2014-12-26 MED ORDER — AMOXICILLIN 875 MG PO TABS: 875.0000 mg | ORAL_TABLET | Freq: Two times a day (BID) | ORAL | Status: DC

## 2014-12-26 NOTE — Progress Notes (Signed)
Pre visit review using our clinic review tool, if applicable. No additional management support is needed unless otherwise documented below in the visit note. 

## 2014-12-26 NOTE — Patient Instructions (Signed)
Saline nasal spray - flush nose at least 2-3x/day  nasacort nasal spray - 2 sprays each nostril one time per day.  Do this in the evening.   Mucinex DM in the am and robitussin DM in the evening.    Take the antibiotic twice a day.

## 2014-12-29 ENCOUNTER — Encounter: Payer: Self-pay | Admitting: Internal Medicine

## 2014-12-29 DIAGNOSIS — J329 Chronic sinusitis, unspecified: Secondary | ICD-10-CM | POA: Insufficient documentation

## 2014-12-29 NOTE — Progress Notes (Signed)
Subjective:    Patient ID: Jill Torres, female    DOB: 30-Jun-1951, 64 y.o.   MRN: [id number]  HPI 64 year old female with past history of GERD, allergies and hypercholesterolemia who comes in today with concerns regarding a possible sinus infection.  States she started having some congestion last week.  Some increased drainage and nasal congestion - blood/colored mucus.  Worse on left side.  Symptoms worsened over the last two days.   Burning in her sinus cavity.  Some sinus headache.  Some chills.  Using ice packs.  No chest congestion.  Some cough - felt to be related to drainage.  No sob.     Past Medical History  Diagnosis Date  . Unspecified hypothyroidism   . Irritable bowel syndrome   . Unspecified essential hypertension   . Diverticulosis of colon (without mention of hemorrhage)   . Arthritis   . Allergy   . GERD (gastroesophageal reflux disease)   . History of chicken pox   . History of shingles may 2013    Current Outpatient Prescriptions on File Prior to Visit  Medication Sig Dispense Refill  . bifidobacterium infantis (ALIGN) capsule Take 1 capsule by mouth daily.      . calcium-vitamin D (OSCAL WITH D 500-200) 500-200 MG-UNIT per tablet Take 1 tablet by mouth daily.      . cetirizine (ZYRTEC) 10 MG chewable tablet Chew 10 mg by mouth daily.     . Coenzyme Q10 (COQ10 PO) Take by mouth daily.    . fish oil-omega-3 fatty acids 1000 MG capsule Take 2 g by mouth daily.    Marland Kitchen FLUoxetine (PROZAC) 10 MG capsule TAKE 1 CAPSULE BY MOUTH DAILY. (Patient taking differently: TAKE 1 CAPSULE BY MOUTH DAILY AS NEEDED.) 30 capsule 2  . ibuprofen (ADVIL,MOTRIN) 200 MG tablet Take 200 mg by mouth as directed.      . multivitamin (THERAGRAN) per tablet Take 1 tablet by mouth daily.      . NEOMYCIN-POLYMYXIN-HYDROCORTISONE (CORTISPORIN) 1 % SOLN otic solution Place 3 drops into the left ear every 8 (eight) hours. 10 mL 0  . polyethylene glycol (MIRALAX) powder Take 17 g by mouth as  needed.     . pravastatin (PRAVACHOL) 10 MG tablet TAKE 1 TABLET BY MOUTH DAILY 30 tablet 2   No current facility-administered medications on file prior to visit.    Review of Systems Patient denies any lightheadedness or dizziness.  Increase sinus headache.  Increased nasal congestion and drainage.  productive colored/blood tinged mucus.  No chest tightness.   No increased shortness of breath.  Some cough.   No nausea or vomiting.  No bowel change, such as diarrhea.  Using ice packs.         Objective:   Physical Exam Filed Vitals:   12/26/14 1023  BP: 140/80  Pulse: 93  Temp: 98.3 F (85.38 C)   64 year old female in no acute distress.   HEENT:  Nares- erythematous turbinates.   Oropharynx - without lesions.  Tenderness to palpation over the sinuses.   NECK:  Supple.  Nontender.  HEART:  Appears to be regular. LUNGS:  No crackles or wheezing audible.  Respirations even and unlabored.         Assessment & Plan:  1. Acute sinusitis, recurrence not specified, unspecified location Symptoms and exam as outlined.  Appears to be c/w sinus infection.  Saline nasal spray and nasacort nasal spray as directed.  Amoxicillin bid.  mucinex  and robitussin as directed.  Rest.  Fluids.

## 2015-02-06 ENCOUNTER — Other Ambulatory Visit: Payer: Self-pay | Admitting: Internal Medicine

## 2015-02-08 ENCOUNTER — Telehealth: Payer: BLUE CROSS/BLUE SHIELD | Admitting: Physician Assistant

## 2015-02-08 ENCOUNTER — Encounter: Payer: Self-pay | Admitting: Internal Medicine

## 2015-02-08 DIAGNOSIS — R070 Pain in throat: Secondary | ICD-10-CM

## 2015-02-08 DIAGNOSIS — J019 Acute sinusitis, unspecified: Secondary | ICD-10-CM

## 2015-02-08 NOTE — Progress Notes (Signed)
Based on what you shared with me it looks like you have a condition that should be evaluated in a face to face office visit.  Your symptoms sound like a bacterial sinusitis, but there is possibly an atypical throat infection present. If it were classic strep, the antibiotic given should clear the infection.  The throat needs to be looked at before antibiotic changes can be accurately made.  Stay well hydrated and continue the Augmentin, but schedule a follow-up appointment with your primary care provider so he/she can give you a good examination.  If you are having a true medical emergency please call 911.  If you need an urgent face to face visit, [facility] has four urgent care centers for your convenience.  . [facility] a Provider at this Location  [address] [place], [place] [address] . 8 am to 8 pm Monday-Friday . 9 am to 7 pm Saturday-Sunday  . [facility] [place] a Provider at this Location  [address], [place], [place] [address] . 8 am to 8 pm Monday-Friday . 9 am to 6 pm Saturday . 11 am to 6 pm Sunday   . [facility] [place] Get Driving Directions  [address], [place] [address] . 8 am to 8 pm Monday-Friday . 9 am to 4 pm Saturday-Sunday   . [facility] (a walk in primary care provider)  [place] a Provider at this Location  [place], [place] [address] . 8 am to 8:30 pm Monday-Thursday . 8 am to 6 pm Friday . 8 am to 4 pm Saturday-Sunday   Your e-visit answers were reviewed by a board certified advanced clinical practitioner to complete your personal care plan.  Depending on the condition, your plan could have included both over the counter or prescription medications.  You will get an e-mail  in the next two days asking about your experience.  I hope that your e-visit has been valuable and will speed your recovery . Thank you for choosing an e-visit.

## 2015-02-09 ENCOUNTER — Other Ambulatory Visit: Payer: Self-pay | Admitting: Internal Medicine

## 2015-02-09 ENCOUNTER — Encounter: Payer: Self-pay | Admitting: Nurse Practitioner

## 2015-02-09 ENCOUNTER — Ambulatory Visit (INDEPENDENT_AMBULATORY_CARE_PROVIDER_SITE_OTHER): Payer: BLUE CROSS/BLUE SHIELD | Admitting: Nurse Practitioner

## 2015-02-09 VITALS — BP 142/84 | HR 75 | Temp 97.9°F | Resp 12 | Ht 64.0 in | Wt 180.1 lb

## 2015-02-09 DIAGNOSIS — J029 Acute pharyngitis, unspecified: Secondary | ICD-10-CM | POA: Insufficient documentation

## 2015-02-09 DIAGNOSIS — J019 Acute sinusitis, unspecified: Secondary | ICD-10-CM

## 2015-02-09 LAB — POCT RAPID STREP A (OFFICE): RAPID STREP A SCREEN: POSITIVE — AB

## 2015-02-09 MED ORDER — AZITHROMYCIN 250 MG PO TABS: ORAL_TABLET | ORAL | Status: DC

## 2015-02-09 NOTE — Progress Notes (Signed)
Subjective:    Patient ID: Jill Torres, female    DOB: 15-Dec-1950, 64 y.o.   MRN: [id number]  HPI  Ms. Korf is a 64 yo female with a CC of sore throat, coughing, rhinorrhea, congestion, and blood in nasal drainage.   1) Rhinorrhea- streaks of blood in thick mucous, clear Cough is dry, sore throat, painful throat. Painful on right more than left.  Saw Dr. Nicki Reaper in Jan. Feels she never truly got over her sinusitis. Thursday she was a Walk-in at [facility]- ABRS diagnosed Amoxicillin and Advil cold/sinus- 5 days  Cool mist humidified Neti Pot- twice daily as her normal regimen.    Review of Systems  Constitutional: Positive for chills, diaphoresis and fatigue. Negative for fever.  HENT: Positive for congestion, mouth sores, nosebleeds, postnasal drip, rhinorrhea, sinus pressure, sneezing and sore throat. Negative for ear discharge and ear pain.        Frontal pressure L> R   Eyes: Negative for visual disturbance.  Respiratory: Positive for cough. Negative for chest tightness, shortness of breath and wheezing.   Cardiovascular: Negative for chest pain, palpitations and leg swelling.  Gastrointestinal: Negative for nausea, vomiting and diarrhea.  Skin: Negative for rash.  Neurological: Positive for headaches. Negative for dizziness, weakness and numbness.  Psychiatric/Behavioral: The patient is not nervous/anxious.    Past Medical History  Diagnosis Date  . Unspecified hypothyroidism   . Irritable bowel syndrome   . Unspecified essential hypertension   . Diverticulosis of colon (without mention of hemorrhage)   . Arthritis   . Allergy   . GERD (gastroesophageal reflux disease)   . History of chicken pox   . History of shingles may 2013    History   Social History  . Marital Status: Divorced    Spouse Name: N/A  . Number of Children: 2  . Years of Education: N/A   Occupational History  . Sales     [employer]    Social History Main Topics    . Smoking status: Never Smoker   . Smokeless tobacco: Never Used  . Alcohol Use: No  . Drug Use: No  . Sexual Activity: Not on file   Other Topics Concern  . Not on file   Social History Narrative   No regular exercise   Lives in noisy environment- very stressful    Daily caffeine use     Past Surgical History  Procedure Laterality Date  . Cesarean section      x 2  . Cholecystectomy      x 2  . Dilation and curettage of uterus      x 2  . Partial hysterectomy  1983    cyst on ovary and heavy bleeding   . Bladder diverticulectomy  1990  . Nasal septum surgery  1999  . Appendectomy  1974  . Abdominal hysterectomy  1983    due to heavy bleeding & ovary cyst    Family History  Problem Relation Age of Onset  . Arthritis Mother   . Hypertension Mother   . Hyperlipidemia Mother   . Atrial fibrillation Mother   . Heart disease Mother   . Diabetes Mother   . Hyperlipidemia Father   . Heart disease Father     s/p CABG  . Stroke Father   . Rheumatic fever Sister     s/p valve replacement  . Deep vein thrombosis Brother     after immobilization     No Known  Allergies  Current Outpatient Prescriptions on File Prior to Visit  Medication Sig Dispense Refill  . bifidobacterium infantis (ALIGN) capsule Take 1 capsule by mouth daily.      . calcium-vitamin D (OSCAL WITH D 500-200) 500-200 MG-UNIT per tablet Take 1 tablet by mouth daily.      . cetirizine (ZYRTEC) 10 MG chewable tablet Chew 10 mg by mouth daily.     . Coenzyme Q10 (COQ10 PO) Take by mouth daily.    . fish oil-omega-3 fatty acids 1000 MG capsule Take 2 g by mouth daily.    Marland Kitchen FLUoxetine (PROZAC) 10 MG capsule TAKE 1 CAPSULE BY MOUTH DAILY. 30 capsule 2  . ibuprofen (ADVIL,MOTRIN) 200 MG tablet Take 200 mg by mouth as directed.      . multivitamin (THERAGRAN) per tablet Take 1 tablet by mouth daily.      . polyethylene glycol (MIRALAX) powder Take 17 g by mouth as needed.     . pravastatin (PRAVACHOL) 10  MG tablet TAKE 1 TABLET BY MOUTH DAILY 30 tablet 2  . NEOMYCIN-POLYMYXIN-HYDROCORTISONE (CORTISPORIN) 1 % SOLN otic solution Place 3 drops into the left ear every 8 (eight) hours. (Patient not taking: Reported on 02/09/2015) 10 mL 0   No current facility-administered medications on file prior to visit.      Objective:   Physical Exam  Constitutional: She is oriented to person, place, and time. She appears well-developed and well-nourished. No distress.  BP 142/84 mmHg  Pulse 75  Temp(Src) 97.9 F (36.6 C) (Oral)  Resp 12  Ht 5\' 4"  (1.626 m)  Wt 180 lb 1.9 oz (81.702 kg)  BMI 30.90 kg/m2  SpO2 95%  LMP 12/12/1981   HENT:  Head: Normocephalic and atraumatic.  Right Ear: External ear normal.  Left Ear: External ear normal.  Mouth/Throat: Oropharyngeal exudate present.  TM's clear bilaterally   Eyes: Right eye exhibits no discharge. Left eye exhibits no discharge. No scleral icterus.  Cardiovascular: Normal rate, regular rhythm, normal heart sounds and intact distal pulses.  Exam reveals no gallop and no friction rub.   No murmur heard. Pulmonary/Chest: Effort normal and breath sounds normal. No respiratory distress. She has no wheezes. She has no rales. She exhibits no tenderness.  Neurological: She is alert and oriented to person, place, and time. No cranial nerve deficit. She exhibits normal muscle tone. Coordination normal.  Skin: Skin is warm and dry. No rash noted. She is not diaphoretic.  Psychiatric: She has a normal mood and affect. Her behavior is normal. Judgment and thought content normal.      Assessment & Plan:

## 2015-02-09 NOTE — Assessment & Plan Note (Signed)
POCT strep positive. Will stop Augmentin and start Z-pack. Okay to continue advil cold/sinus. Asked pt to call Thursday if failing to improve or worsening.

## 2015-02-09 NOTE — Patient Instructions (Signed)
Please stop the augmentin and switch to the Z-pack.   Continue with the probiotic.   Continue with rest, water, and Advil cold/sinus.   Call us if worsening/failure to improve by Thursday.

## 2015-02-09 NOTE — Telephone Encounter (Signed)
Appt scheduled today with Morey Hummingbird.

## 2015-02-09 NOTE — Assessment & Plan Note (Signed)
Sinusitis with sore throat. POCT strep A was faint red line. Counted as positive. Augmentin not helpful. Pt worsening despite treatment. Will stop Augmentin and start Z-pack. Gave pt instructions and asked her to continue use of probiotic. Okay to continue with advil cold/sinus and use losanges for throat. Asked her to call by Thursday if not improving.

## 2015-02-09 NOTE — Progress Notes (Signed)
Pre visit review using our clinic review tool, if applicable. No additional management support is needed unless otherwise documented below in the visit note. 

## 2015-02-12 ENCOUNTER — Encounter: Payer: Self-pay | Admitting: Internal Medicine

## 2015-02-12 DIAGNOSIS — R0981 Nasal congestion: Secondary | ICD-10-CM

## 2015-02-13 ENCOUNTER — Encounter: Payer: Self-pay | Admitting: *Deleted

## 2015-02-13 NOTE — Telephone Encounter (Signed)
Order placed for ENT referral.   

## 2015-05-15 ENCOUNTER — Other Ambulatory Visit: Payer: Self-pay | Admitting: Otolaryngology

## 2015-05-15 DIAGNOSIS — R070 Pain in throat: Secondary | ICD-10-CM

## 2015-05-21 ENCOUNTER — Ambulatory Visit
Admission: RE | Admit: 2015-05-21 | Discharge: 2015-05-21 | Disposition: A | Discharge status: Home / Self Care | Payer: BLUE CROSS/BLUE SHIELD | Source: Ambulatory Visit | Attending: Otolaryngology | Admitting: Otolaryngology

## 2015-05-21 DIAGNOSIS — M542 Cervicalgia: Secondary | ICD-10-CM | POA: Diagnosis present

## 2015-05-21 DIAGNOSIS — R070 Pain in throat: Secondary | ICD-10-CM | POA: Diagnosis not present

## 2015-05-21 MED ORDER — IOHEXOL 300 MG/ML  SOLN
75.0000 mL | Freq: Once | INTRAMUSCULAR | Status: AC | PRN
  Administered 2015-05-21: 75 mL via INTRAVENOUS

## 2015-06-01 ENCOUNTER — Other Ambulatory Visit: Payer: Self-pay | Admitting: Internal Medicine

## 2015-06-04 ENCOUNTER — Encounter: Payer: Self-pay | Admitting: *Deleted

## 2015-06-04 ENCOUNTER — Encounter
Admission: RE | Admit: 2015-06-04 | Discharge: 2015-06-04 | Disposition: A | Discharge status: Home / Self Care | Payer: BLUE CROSS/BLUE SHIELD | Source: Ambulatory Visit | Attending: Otolaryngology | Admitting: Otolaryngology

## 2015-06-04 NOTE — Patient Instructions (Signed)
  Your procedure is scheduled on: 06/11/15 Report to Day Surgery. To find out your arrival time please call [phone] between 1PM - 3PM on 06/10/15.  Remember: Instructions that are not followed completely may result in serious medical risk, up to and including death, or upon the discretion of your surgeon and anesthesiologist your surgery may need to be rescheduled.    __x__ 1. Do not eat food or drink liquids after midnight. No gum chewing or hard candies.     _x___ 2. No Alcohol for 24 hours before or after surgery.   ____ 3. Bring all medications with you on the day of surgery if instructed.    __x__ 4. Notify your doctor if there is any change in your medical condition     (cold, fever, infections).     Do not wear jewelry, make-up, hairpins, clips or nail polish.  Do not wear lotions, powders, or perfumes. You may wear deodorant.  Do not shave 48 hours prior to surgery. Men may shave face and neck.  Do not bring valuables to the hospital.    [facility] is not responsible for any belongings or valuables.               Contacts, dentures or bridgework may not be worn into surgery.  Leave your suitcase in the car. After surgery it may be brought to your room.  For patients admitted to the hospital, discharge time is determined by your                treatment team.   Patients discharged the day of surgery will not be allowed to drive home.   Please read over the following fact sheets that you were given:   Surgical Site Infection Prevention   ____ Take these medicines the morning of surgery with A SIP OF WATER:    1.   2.   3.   4.  5.  6.  ____ Fleet Enema (as directed)   ____ Use CHG Soap as directed  ____ Use inhalers on the day of surgery  ____ Stop metformin 2 days prior to surgery    ____ Take 1/2 of usual insulin dose the night before surgery and none on the morning of surgery.   ____ Stop Coumadin/Plavix/aspirin on ____ Stop Anti-inflammatories on     _x___ Stop supplements until after surgery.    ____ Bring C-Pap to the hospital.

## 2015-06-11 ENCOUNTER — Encounter
Admission: RE | Disposition: A | Discharge status: Home / Self Care | Payer: Self-pay | Source: Ambulatory Visit | Attending: Otolaryngology

## 2015-06-11 ENCOUNTER — Encounter: Payer: Self-pay | Admitting: *Deleted

## 2015-06-11 ENCOUNTER — Ambulatory Visit
Admission: RE | Admit: 2015-06-11 | Discharge: 2015-06-11 | Disposition: A | Discharge status: Home / Self Care | Payer: BLUE CROSS/BLUE SHIELD | Source: Ambulatory Visit | Attending: Otolaryngology | Admitting: Otolaryngology

## 2015-06-11 ENCOUNTER — Ambulatory Visit: Payer: BLUE CROSS/BLUE SHIELD | Admitting: Anesthesiology

## 2015-06-11 DIAGNOSIS — K148 Other diseases of tongue: Secondary | ICD-10-CM | POA: Insufficient documentation

## 2015-06-11 DIAGNOSIS — K1370 Unspecified lesions of oral mucosa: Secondary | ICD-10-CM | POA: Insufficient documentation

## 2015-06-11 DIAGNOSIS — Z79899 Other long term (current) drug therapy: Secondary | ICD-10-CM | POA: Diagnosis not present

## 2015-06-11 HISTORY — PX: DIRECT LARYNGOSCOPY: SHX5326

## 2015-06-11 HISTORY — DX: Major depressive disorder, single episode, unspecified: F32.9

## 2015-06-11 HISTORY — DX: Depression, unspecified: F32.A

## 2015-06-11 SURGERY — LARYNGOSCOPY, DIRECT
Anesthesia: General | Laterality: Right | Wound class: Clean Contaminated

## 2015-06-11 MED ORDER — PHENYLEPHRINE HCL 10 % OP SOLN
OPHTHALMIC | Status: AC
  Filled 2015-06-11: qty 5

## 2015-06-11 MED ORDER — REMIFENTANIL HCL 1 MG IV SOLR
INTRAVENOUS | Status: DC | PRN
  Administered 2015-06-11: .25 ug via INTRAVENOUS

## 2015-06-11 MED ORDER — ONDANSETRON HCL 4 MG/2ML IJ SOLN: 4.0000 mg | Freq: Once | INTRAMUSCULAR | Status: DC | PRN

## 2015-06-11 MED ORDER — ONDANSETRON HCL 4 MG/2ML IJ SOLN
INTRAMUSCULAR | Status: DC | PRN
  Administered 2015-06-11: 4 mg via INTRAVENOUS

## 2015-06-11 MED ORDER — FAMOTIDINE 20 MG PO TABS
ORAL_TABLET | ORAL | Status: AC
  Filled 2015-06-11: qty 1

## 2015-06-11 MED ORDER — DEXAMETHASONE SODIUM PHOSPHATE 4 MG/ML IJ SOLN
INTRAMUSCULAR | Status: DC | PRN
  Administered 2015-06-11: 10 mg via INTRAVENOUS

## 2015-06-11 MED ORDER — REMIFENTANIL HCL 1 MG IV SOLR
INTRAVENOUS | Status: DC | PRN
  Administered 2015-06-11: .1 ug/kg/min via INTRAVENOUS

## 2015-06-11 MED ORDER — PROMETHAZINE HCL 12.5 MG PO TABS: 12.5000 mg | ORAL_TABLET | Freq: Four times a day (QID) | ORAL | Status: DC | PRN

## 2015-06-11 MED ORDER — PROPOFOL 10 MG/ML IV BOLUS
INTRAVENOUS | Status: DC | PRN
  Administered 2015-06-11: 200 mg via INTRAVENOUS

## 2015-06-11 MED ORDER — PHENYLEPHRINE HCL 0.5 % NA SOLN
NASAL | Status: DC | PRN
  Administered 2015-06-11: 2 [drp]

## 2015-06-11 MED ORDER — HYDROCODONE-ACETAMINOPHEN 5-325 MG PO TABS: 1.0000 | ORAL_TABLET | ORAL | Status: DC | PRN

## 2015-06-11 MED ORDER — SUCCINYLCHOLINE CHLORIDE 20 MG/ML IJ SOLN
INTRAMUSCULAR | Status: DC | PRN
  Administered 2015-06-11: 100 mg via INTRAVENOUS

## 2015-06-11 MED ORDER — FENTANYL CITRATE (PF) 100 MCG/2ML IJ SOLN: 25.0000 ug | INTRAMUSCULAR | Status: DC | PRN

## 2015-06-11 MED ORDER — PHENYLEPHRINE HCL 10 MG/ML IJ SOLN
INTRAMUSCULAR | Status: AC
Start: 2015-06-11 — End: 2015-06-11
  Filled 2015-06-11: qty 1

## 2015-06-11 MED ORDER — FENTANYL CITRATE (PF) 100 MCG/2ML IJ SOLN
INTRAMUSCULAR | Status: DC | PRN
  Administered 2015-06-11: 100 ug via INTRAVENOUS

## 2015-06-11 MED ORDER — EPINEPHRINE HCL 1 MG/ML IJ SOLN
INTRAMUSCULAR | Status: AC
  Filled 2015-06-11: qty 1

## 2015-06-11 MED ORDER — PHENYLEPHRINE HCL 10 MG/ML IJ SOLN
INTRAMUSCULAR | Status: DC | PRN
  Administered 2015-06-11: 150 ug via INTRAVENOUS

## 2015-06-11 MED ORDER — LACTATED RINGERS IV SOLN
INTRAVENOUS | Status: DC
  Administered 2015-06-11: 09:00:00 via INTRAVENOUS

## 2015-06-11 MED ORDER — FAMOTIDINE 20 MG PO TABS
20.0000 mg | ORAL_TABLET | Freq: Once | ORAL | Status: AC
  Administered 2015-06-11: 20 mg via ORAL

## 2015-06-11 MED ORDER — LIDOCAINE HCL (CARDIAC) 20 MG/ML IV SOLN
INTRAVENOUS | Status: DC | PRN
  Administered 2015-06-11: 100 mg via INTRAVENOUS

## 2015-06-11 MED ORDER — OXYMETAZOLINE HCL 0.05 % NA SOLN
NASAL | Status: AC
Start: 2015-06-11 — End: 2015-06-11
  Filled 2015-06-11: qty 15

## 2015-06-11 MED ORDER — LIDOCAINE HCL (PF) 4 % IJ SOLN
INTRAMUSCULAR | Status: AC
  Filled 2015-06-11: qty 5

## 2015-06-11 SURGICAL SUPPLY — 23 items
ATOMIZER TRACHEAL (MISCELLANEOUS) ×1 IMPLANT
BANDAGE EYE OVAL (MISCELLANEOUS) ×1 IMPLANT
BASIN GRAD PLASTIC 32OZ STRL (MISCELLANEOUS) ×2 IMPLANT
CUP MEDICINE 2OZ PLAST GRAD ST (MISCELLANEOUS) ×3 IMPLANT
DRAPE TABLE BACK 80X90 (DRAPES) ×2 IMPLANT
DRESSING TELFA 4X3 1S ST N-ADH (GAUZE/BANDAGES/DRESSINGS) ×2 IMPLANT
GLOVE BIO SURGEON STRL SZ7.5 (GLOVE) ×6 IMPLANT
GOWN STRL REUS W/ TWL LRG LVL3 (GOWN DISPOSABLE) ×2 IMPLANT
GOWN STRL REUS W/TWL LRG LVL3 (GOWN DISPOSABLE) ×6
LABEL OR SOLS (LABEL) ×2 IMPLANT
MARKER SKIN W/RULER 31145785 (MISCELLANEOUS) ×2 IMPLANT
NDL FILTER BLUNT 18X1 1/2 (NEEDLE) ×2 IMPLANT
NDL SAFETY 18GX1.5 (NEEDLE) ×2 IMPLANT
NEEDLE FILTER BLUNT 18X 1/2SAF (NEEDLE)
NEEDLE FILTER BLUNT 18X1 1/2 (NEEDLE) IMPLANT
PATTIES SURGICAL .5 X.5 (GAUZE/BANDAGES/DRESSINGS) ×2 IMPLANT
SOL ANTI-FOG 6CC FOG-OUT (MISCELLANEOUS) ×1 IMPLANT
SOL FOG-OUT ANTI-FOG 6CC (MISCELLANEOUS) ×1
SPONGE XRAY 4X4 16PLY STRL (MISCELLANEOUS) ×2 IMPLANT
SYR 3ML LL SCALE MARK (SYRINGE) ×3 IMPLANT
SYRINGE 10CC LL (SYRINGE) ×1 IMPLANT
TOWEL OR 17X26 4PK STRL BLUE (TOWEL DISPOSABLE) ×2 IMPLANT
TUBING CONNECTING 10 (TUBING) ×2 IMPLANT

## 2015-06-11 NOTE — Transfer of Care (Signed)
Immediate Anesthesia Transfer of Care Note  Patient: Jill Torres  Procedure(s) Performed: Procedure(s): suspension microdirect laryngoscopy with biopsy of right tongue base (Right)  Patient Location: PACU  Anesthesia Type:General  Level of Consciousness: awake, alert  and oriented  Airway & Oxygen Therapy: Patient Spontanous Breathing and Patient connected to nasal cannula oxygen  Post-op Assessment: Report given to RN and Post -op Vital signs reviewed and stable  Post vital signs: Reviewed and stable  Last Vitals:  Filed Vitals:   06/11/15 1133  BP: 113/66  Pulse:   Temp: 36.2 C  Resp: 14    Complications: No apparent anesthesia complications

## 2015-06-11 NOTE — Anesthesia Postprocedure Evaluation (Signed)
  Anesthesia Post-op Note  Patient: Jill Torres  Procedure(s) Performed: Procedure(s): suspension microdirect laryngoscopy with biopsy of right tongue base (Right)  Anesthesia type:General  Patient location: PACU  Post pain: Pain level controlled  Post assessment: Post-op Vital signs reviewed, Patient's Cardiovascular Status Stable, Respiratory Function Stable, Patent Airway and No signs of Nausea or vomiting  Post vital signs: Reviewed and stable  Last Vitals:  Filed Vitals:   06/11/15 1140  BP:   Pulse: 94  Temp:   Resp: 24    Level of consciousness: awake, alert  and patient cooperative  Complications: No apparent anesthesia complications

## 2015-06-11 NOTE — Anesthesia Preprocedure Evaluation (Addendum)
Anesthesia Evaluation  Patient identified by MRN, date of birth, ID band Patient awake    Reviewed: Allergy & Precautions, NPO status , Patient's Chart, lab work & pertinent test results  History of Anesthesia Complications Negative for: history of anesthetic complications  Airway Mallampati: II  TM Distance: >3 FB Neck ROM: Full    Dental no notable dental hx.    Pulmonary neg pulmonary ROS,  breath sounds clear to auscultation  Pulmonary exam normal       Cardiovascular Exercise Tolerance: Good negative cardio ROS Normal cardiovascular examRhythm:Regular Rate:Normal     Neuro/Psych Anxiety Depression negative neurological ROS     GI/Hepatic Neg liver ROS, GERD-  Controlled,  Endo/Other  Hypothyroidism   Renal/GU negative Renal ROS  negative genitourinary   Musculoskeletal  (+) Arthritis -, Osteoarthritis,    Abdominal   Peds negative pediatric ROS (+)  Hematology negative hematology ROS (+)   Anesthesia Other Findings   Reproductive/Obstetrics negative OB ROS                            Anesthesia Physical Anesthesia Plan  ASA: II  Anesthesia Plan: General   Post-op Pain Management:    Induction: Intravenous  Airway Management Planned: Oral ETT  Additional Equipment:   Intra-op Plan:   Post-operative Plan: Extubation in OR  Informed Consent: I have reviewed the patients History and Physical, chart, labs and discussed the procedure including the risks, benefits and alternatives for the proposed anesthesia with the patient or authorized representative who has indicated his/her understanding and acceptance.   Dental advisory given  Plan Discussed with: Surgeon and CRNA  Anesthesia Plan Comments:         Anesthesia Quick Evaluation

## 2015-06-11 NOTE — H&P (Signed)
..  History and Physical paper copy reviewed and updated date of procedure and will be scanned into system.  

## 2015-06-11 NOTE — Op Note (Signed)
..  06/11/2015  11:24 AM    Raglin, Butch Penny  [id number]   Pre-Op Dx: Anise Salvo on tongue  Post-op Dx: SAME  Proc: Suspension microlaryngoscopy with biopsy of base of tongue  Surg: Joleigh Mineau  Anes: GOT  EBL: <10ccs  Comp: None  Indications: Fullness sensation on right neck persistent with asymmetry at right base of tongue on CT and exam  Findings: Mild asymmetry with fullness at right base of tongue/glossotonsillar fold.  Multiple biopsies of inferior tonsil to base of tongue on right taken.  No mass or lesion elsewhere in larynx, pharynx, hypopharynx.  Description of Procedure: After the patient was identified in hold and the history and physical and consent was reviewed and updated.  The patient was taken to the OR and placed in a supine position. General endotracheal anesthesia was induced in the normal fashion. The patient was next prepped and draped in a sterile normal fashion.  At this time, a mouth guard and shoulder roll was placed, the patient was rotated 90 degrees.  An anterior commisure laryngoscope was placed into the patient's oral cavity.  Evaluation of the oropharynx was made with visual and palpable exam.  Small mildly fibrotic tonsils bilaterally were noticed.  Vague fullness at right base of tongue/glossotonsillar fold was noted on palpable exam.  Examination with a zero degree endoscope was made throughout the hypopharynx and larynx.  No ulcerations or exophytic lesions were identified.  The patient's true vocal folds were normal as well as the patient's piriform sinuses and post-cricoid region were clear.  At this time, multiple biopsies were taken of the patient's base of tongue as well as the inferior tonsil pole as well as the glosso-tonsillar fold on the patient's right side.  Hemostasis was obtained with topical Afrin soaked pledgets.  These samples were taken for fresh examination and lymphoma workup.  At this time, the laryngoscope was  removed from the patient's oral cavity without injury to teeth, lips, or gums.  At this time the patient was extubated and taken to PACU in good condition.  Plan: Follow pathology for workup. Follow up in several weeks.Marland Kitchen  Vylet Maffia  06/11/2015 11:24 AM

## 2015-06-19 LAB — SURGICAL PATHOLOGY

## 2015-07-05 ENCOUNTER — Other Ambulatory Visit: Payer: Self-pay | Admitting: Internal Medicine

## 2015-07-06 NOTE — Telephone Encounter (Signed)
Please advise refill? Duplicate medications.

## 2015-07-06 NOTE — Telephone Encounter (Signed)
I sent in refill for prozac 10mg  #30 with one refill.  She needs a physical exam scheduled after 09/03/15.

## 2015-08-07 ENCOUNTER — Encounter: Payer: Self-pay | Admitting: Internal Medicine

## 2015-08-07 MED ORDER — ALPRAZOLAM 0.25 MG PO TABS: 0.2500 mg | ORAL_TABLET | Freq: Every day | ORAL | Status: DC | PRN

## 2015-08-07 NOTE — Telephone Encounter (Signed)
I do not mind prescribing her some xanax to help her get through this time.  Confirm had not problems taking it previously.  Please also see if she feels she needs to be seen.  We can work her in if needs to be evaluated.

## 2015-08-07 NOTE — Telephone Encounter (Signed)
rx ok'd for xanax #20 with no refills.  Let us know if need anything more or needs to be seen.

## 2015-08-31 ENCOUNTER — Encounter: Payer: Self-pay | Admitting: Internal Medicine

## 2015-08-31 ENCOUNTER — Other Ambulatory Visit: Payer: Self-pay | Admitting: Internal Medicine

## 2015-08-31 DIAGNOSIS — E039 Hypothyroidism, unspecified: Secondary | ICD-10-CM

## 2015-08-31 DIAGNOSIS — R030 Elevated blood-pressure reading, without diagnosis of hypertension: Principal | ICD-10-CM

## 2015-08-31 DIAGNOSIS — K219 Gastro-esophageal reflux disease without esophagitis: Secondary | ICD-10-CM

## 2015-08-31 DIAGNOSIS — E7800 Pure hypercholesterolemia, unspecified: Secondary | ICD-10-CM

## 2015-08-31 DIAGNOSIS — IMO0001 Reserved for inherently not codable concepts without codable children: Secondary | ICD-10-CM

## 2015-08-31 NOTE — Telephone Encounter (Signed)
Please advise refill, last labs were 05/16/14?

## 2015-08-31 NOTE — Telephone Encounter (Signed)
Needs labs scheduled.  I have placed the order for the labs.  I accidentally refilled pravastatin #30 with 2 refills.  Please call pharmacy and just refill x 1.  Will refill more once labs are done.  Thanks

## 2015-08-31 NOTE — Telephone Encounter (Signed)
I can see her tomorrow at 1:15.  Please schedule.

## 2015-08-31 NOTE — Telephone Encounter (Signed)
She has an appointment with you tomorrow, would like to have the labs done then. Called pharmacy to change order.

## 2015-09-01 ENCOUNTER — Encounter: Payer: Self-pay | Admitting: Internal Medicine

## 2015-09-01 ENCOUNTER — Ambulatory Visit (INDEPENDENT_AMBULATORY_CARE_PROVIDER_SITE_OTHER): Payer: Self-pay | Admitting: Internal Medicine

## 2015-09-01 VITALS — BP 118/80 | HR 56 | Temp 98.3°F | Ht 64.0 in | Wt 167.4 lb

## 2015-09-01 DIAGNOSIS — N3000 Acute cystitis without hematuria: Secondary | ICD-10-CM

## 2015-09-01 DIAGNOSIS — N390 Urinary tract infection, site not specified: Secondary | ICD-10-CM | POA: Insufficient documentation

## 2015-09-01 DIAGNOSIS — F419 Anxiety disorder, unspecified: Secondary | ICD-10-CM

## 2015-09-01 MED ORDER — CIPROFLOXACIN HCL 500 MG PO TABS: 500.0000 mg | ORAL_TABLET | Freq: Two times a day (BID) | ORAL | Status: DC

## 2015-09-01 NOTE — Progress Notes (Signed)
Patient ID: Jill Torres, female   DOB: [birth year], 64 y.o.   MRN: [id number]   Subjective:    Patient ID: Jill Torres, female    DOB: [birth year], 64 y.o.   MRN: [id number]  HPI  Patient here as a work in with concerns regarding a possible UTI.  States symptoms have been present of over the last few days.  Describes low pressure and lower back discomfort.  Some discomfort with urination.  No vaginal discharge.  No significant abdominal pain or cramping.  No vomiting.  No fever.  Increased stress and anxiety.  Discussed with her today.  She does not feel needs any further intervention.  Eating and drinking.     Past Medical History  Diagnosis Date  . Unspecified hypothyroidism   . Irritable bowel syndrome   . Diverticulosis of colon (without mention of hemorrhage)   . Arthritis   . Allergy   . GERD (gastroesophageal reflux disease)   . History of chicken pox   . History of shingles may 2013  . Depression    Past Surgical History  Procedure Laterality Date  . Cesarean section      x 2  . Cholecystectomy      x 2  . Dilation and curettage of uterus      x 2  . Partial hysterectomy  1983    cyst on ovary and heavy bleeding   . Bladder diverticulectomy  1990  . Nasal septum surgery  1999  . Appendectomy  1974  . Abdominal hysterectomy  1983    due to heavy bleeding & ovary cyst  . Direct laryngoscopy Right 06/11/2015    Procedure: suspension microdirect laryngoscopy with biopsy of right tongue base;  Surgeon: Carloyn Manner, MD;  Location: [facility] ORS;  Service: ENT;  Laterality: Right;   Family History  Problem Relation Age of Onset  . Arthritis Mother   . Hypertension Mother   . Hyperlipidemia Mother   . Atrial fibrillation Mother   . Heart disease Mother   . Diabetes Mother   . Hyperlipidemia Father   . Heart disease Father     s/p CABG  . Stroke Father   . Rheumatic fever Sister     s/p valve replacement  . Deep vein thrombosis Brother     after  immobilization    Social History   Social History  . Marital Status: Divorced    Spouse Name: N/A  . Number of Children: 2  . Years of Education: N/A   Occupational History  . Sales     [employer]    Social History Main Topics  . Smoking status: Never Smoker   . Smokeless tobacco: Never Used  . Alcohol Use: No  . Drug Use: No  . Sexual Activity: Not Asked   Other Topics Concern  . None   Social History Narrative   No regular exercise   Lives in noisy environment- very stressful    Daily caffeine use     Outpatient Encounter Prescriptions as of 09/01/2015  Medication Sig  . ALPRAZolam (XANAX) 0.25 MG tablet Take 1 tablet (0.25 mg total) by mouth daily as needed for anxiety.  . Azelastine HCl (ASTEPRO NA) Place into the nose.  . bifidobacterium infantis (ALIGN) capsule Take 1 capsule by mouth daily.    . calcium-vitamin D (OSCAL WITH D 500-200) 500-200 MG-UNIT per tablet Take 1 tablet by mouth daily.    . cetirizine (ZYRTEC) 10 MG chewable tablet Chew 10  mg by mouth daily.   . Coenzyme Q10 (COQ10 PO) Take by mouth daily.  Marland Kitchen EPINEPHrine (EPIPEN IJ) Inject as directed.  . fish oil-omega-3 fatty acids 1000 MG capsule Take 2 g by mouth daily.  Marland Kitchen FLUoxetine (PROZAC) 10 MG capsule TAKE 1 CAPSULE BY MOUTH DAILY.  Marland Kitchen FLUoxetine (PROZAC) 10 MG capsule TAKE 1 CAPSULE BY MOUTH DAILY.  Marland Kitchen ibuprofen (ADVIL,MOTRIN) 200 MG tablet Take 200 mg by mouth as directed.    . multivitamin (THERAGRAN) per tablet Take 1 tablet by mouth daily.    . polyethylene glycol (MIRALAX) powder Take 17 g by mouth as needed.   . pravastatin (PRAVACHOL) 10 MG tablet TAKE 1 TABLET BY MOUTH DAILY  . Triamcinolone Acetonide (NASACORT AQ NA) Place into the nose.  . [DISCONTINUED] HYDROcodone-acetaminophen (NORCO) 5-325 MG per tablet Take 1 tablet by mouth every 4 (four) hours as needed for moderate pain.  . ciprofloxacin (CIPRO) 500 MG tablet Take 1 tablet (500 mg total) by mouth 2 (two) times daily.    . [DISCONTINUED] Cyanocobalamin (VITAMIN B-12 IJ) Inject as directed every 30 (thirty) days.  . [DISCONTINUED] promethazine (PHENERGAN) 12.5 MG tablet Take 1 tablet (12.5 mg total) by mouth every 6 (six) hours as needed for nausea or vomiting. (Patient not taking: Reported on 09/01/2015)   No facility-administered encounter medications on file as of 09/01/2015.    Review of Systems  Constitutional: Negative for appetite change and unexpected weight change.  HENT: Negative for congestion and sinus pressure.   Respiratory: Negative for cough, chest tightness and shortness of breath.   Cardiovascular: Negative for chest pain, palpitations and leg swelling.  Gastrointestinal: Negative for nausea, vomiting, abdominal pain (some lower abdominal pressure.  some low back discomfort. ) and diarrhea.  Genitourinary: Positive for dysuria. Negative for difficulty urinating.  Skin: Negative for color change and rash.  Neurological: Negative for dizziness, light-headedness and headaches.  Psychiatric/Behavioral: Negative for dysphoric mood and agitation.       Increased stress as outlined.         Objective:    Physical Exam  Constitutional: She appears well-developed and well-nourished. No distress.  HENT:  Nose: Nose normal.  Mouth/Throat: Oropharynx is clear and moist.  Neck: Neck supple. No thyromegaly present.  Cardiovascular: Normal rate and regular rhythm.   Pulmonary/Chest: Breath sounds normal. No respiratory distress. She has no wheezes.  Abdominal: Soft. Bowel sounds are normal. There is no tenderness.  Musculoskeletal: She exhibits no edema or tenderness.  No CVA tenderness.    Lymphadenopathy:    She has no cervical adenopathy.  Skin: No rash noted. No erythema.  Psychiatric: She has a normal mood and affect. Her behavior is normal.    BP 118/80 mmHg  Pulse 56  Temp(Src) 98.3 F (36.8 C) (Oral)  Ht 5\' 4"  (1.626 m)  Wt 167 lb 6 oz (75.921 kg)  BMI 28.72 kg/m2  SpO2 96%   LMP 12/12/1981 Wt Readings from Last 3 Encounters:  09/01/15 167 lb 6 oz (75.921 kg)  06/11/15 180 lb (81.647 kg)  02/09/15 180 lb 1.9 oz (81.702 kg)     Lab Results  Component Value Date   WBC 9.0 05/14/2014   HGB 13.2 05/14/2014   HCT 40.2 05/14/2014   PLT 315.0 05/14/2014   GLUCOSE 83 09/02/2014   CHOL 203* 09/02/2014   TRIG 145.0 09/02/2014   HDL 55.40 09/02/2014   LDLDIRECT 155.8 06/27/2013   LDLCALC 119* 09/02/2014   ALT 21 09/02/2014   AST 19 09/02/2014  NA 137 09/02/2014   K 4.4 09/02/2014   CL 105 09/02/2014   CREATININE 1.0 09/02/2014   BUN 14 09/02/2014   CO2 26 09/02/2014   TSH 0.85 05/14/2014       Assessment & Plan:   Problem List Items Addressed This Visit    Anxiety    Discussed with her today.  On prozac.  May need to increase to 20mg  q day.  Follow closely.  Desires no further intervention.        UTI (urinary tract infection) - Primary    Urine dip reveals trace leukocytes and trace blood.  Send for culture.  Given symptoms and urine dip findings, will cover with cipro.  Stay hydrated.  Follow.  Notify me if symptoms worsen or do not resolve.        Relevant Orders   POCT Urinalysis Dipstick   CULTURE, URINE COMPREHENSIVE (Completed)     I spent 25 minutes with the patient and more than 50% of the time was spent in consultation regarding the above.     [name], MD

## 2015-09-01 NOTE — Progress Notes (Signed)
Pre-visit discussion using our clinic review tool. No additional management support is needed unless otherwise documented below in the visit note.  

## 2015-09-05 ENCOUNTER — Encounter: Payer: Self-pay | Admitting: Internal Medicine

## 2015-09-05 LAB — CULTURE, URINE COMPREHENSIVE: Colony Count: 25000

## 2015-09-07 ENCOUNTER — Encounter: Payer: Self-pay | Admitting: Internal Medicine

## 2015-09-07 ENCOUNTER — Other Ambulatory Visit: Payer: Self-pay | Admitting: Internal Medicine

## 2015-09-07 NOTE — Assessment & Plan Note (Signed)
Discussed with her today.  On prozac.  May need to increase to 20mg  q day.  Follow closely.  Desires no further intervention.

## 2015-09-07 NOTE — Telephone Encounter (Signed)
ok'd refill for alprazolam #30 with no refills.  rx signed and in your box.

## 2015-09-07 NOTE — Telephone Encounter (Signed)
Okay to refill? Last seen on 9/20 & next appt on:10/27

## 2015-09-07 NOTE — Assessment & Plan Note (Signed)
Urine dip reveals trace leukocytes and trace blood.  Send for culture.  Given symptoms and urine dip findings, will cover with cipro.  Stay hydrated.  Follow.  Notify me if symptoms worsen or do not resolve.

## 2015-09-08 NOTE — Telephone Encounter (Signed)
Rx faxed to [facility] [address]

## 2015-09-28 ENCOUNTER — Other Ambulatory Visit: Payer: Self-pay | Admitting: Internal Medicine

## 2015-10-01 ENCOUNTER — Other Ambulatory Visit: Payer: Self-pay | Admitting: Internal Medicine

## 2015-10-01 ENCOUNTER — Encounter: Payer: Self-pay | Admitting: Internal Medicine

## 2015-10-08 ENCOUNTER — Ambulatory Visit (INDEPENDENT_AMBULATORY_CARE_PROVIDER_SITE_OTHER): Payer: Managed Care, Other (non HMO) | Admitting: Internal Medicine

## 2015-10-08 ENCOUNTER — Encounter: Payer: Self-pay | Admitting: Internal Medicine

## 2015-10-08 VITALS — BP 128/82 | HR 56 | Temp 98.3°F | Resp 18 | Ht 64.0 in | Wt 164.0 lb

## 2015-10-08 DIAGNOSIS — R1032 Left lower quadrant pain: Secondary | ICD-10-CM

## 2015-10-08 DIAGNOSIS — R634 Abnormal weight loss: Secondary | ICD-10-CM | POA: Diagnosis not present

## 2015-10-08 DIAGNOSIS — Z860100 Personal history of colon polyps, unspecified: Secondary | ICD-10-CM

## 2015-10-08 DIAGNOSIS — R29898 Other symptoms and signs involving the musculoskeletal system: Secondary | ICD-10-CM

## 2015-10-08 DIAGNOSIS — Z1239 Encounter for other screening for malignant neoplasm of breast: Secondary | ICD-10-CM

## 2015-10-08 DIAGNOSIS — R1900 Intra-abdominal and pelvic swelling, mass and lump, unspecified site: Secondary | ICD-10-CM

## 2015-10-08 DIAGNOSIS — F419 Anxiety disorder, unspecified: Secondary | ICD-10-CM

## 2015-10-08 DIAGNOSIS — M539 Dorsopathy, unspecified: Secondary | ICD-10-CM

## 2015-10-08 DIAGNOSIS — K219 Gastro-esophageal reflux disease without esophagitis: Secondary | ICD-10-CM | POA: Diagnosis not present

## 2015-10-08 DIAGNOSIS — Z91048 Other nonmedicinal substance allergy status: Secondary | ICD-10-CM

## 2015-10-08 DIAGNOSIS — K589 Irritable bowel syndrome without diarrhea: Secondary | ICD-10-CM

## 2015-10-08 DIAGNOSIS — R1012 Left upper quadrant pain: Secondary | ICD-10-CM

## 2015-10-08 DIAGNOSIS — Z0000 Encounter for general adult medical examination without abnormal findings: Secondary | ICD-10-CM

## 2015-10-08 DIAGNOSIS — Z9109 Other allergy status, other than to drugs and biological substances: Secondary | ICD-10-CM

## 2015-10-08 MED ORDER — NEOMYCIN-POLYMYXIN-HC 3.5-10000-1 OT SUSP: 3.0000 [drp] | Freq: Three times a day (TID) | OTIC | Status: DC | PRN

## 2015-10-08 MED ORDER — TIZANIDINE HCL 4 MG PO TABS: 4.0000 mg | ORAL_TABLET | Freq: Every evening | ORAL | Status: DC | PRN

## 2015-10-08 MED ORDER — FLUOXETINE HCL 20 MG PO TABS: 20.0000 mg | ORAL_TABLET | Freq: Every day | ORAL | Status: DC

## 2015-10-08 NOTE — Progress Notes (Signed)
Patient ID: AVRYL ROEHM, female   DOB: 13-Mar-1951, 64 y.o.   MRN: [id number]   Subjective:    Patient ID: [name], female    DOB: 06/17/1951, 64 y.o.   MRN: [id number]  HPI  Patient with past history of hypothyroidism, IBS, allerties, GERD and depression.  She comes in today to follow up on these issues as well as for a complete physical exam.  I saw her on 09/01/15.  See that note for details.  Treated for uti.  Does feel better.  Still with some noted fullness.  Some nausea.  No vomiting.  Bowels appear to be stable.  She also reports her neck is tight.  Some light headedness which she feels is related to this.  No significant headache.  No chest pain or tightness.  No sob.  Still with increased stress.  Discussed again with her today.  Discussed increasing prozac.     Past Medical History  Diagnosis Date  . Unspecified hypothyroidism   . Irritable bowel syndrome   . Diverticulosis of colon (without mention of hemorrhage)   . Arthritis   . Allergy   . GERD (gastroesophageal reflux disease)   . History of chicken pox   . History of shingles may 2013  . Depression    Past Surgical History  Procedure Laterality Date  . Cesarean section      x 2  . Cholecystectomy      x 2  . Dilation and curettage of uterus      x 2  . Partial hysterectomy  1983    cyst on ovary and heavy bleeding   . Bladder diverticulectomy  1990  . Nasal septum surgery  1999  . Appendectomy  1974  . Abdominal hysterectomy  1983    due to heavy bleeding & ovary cyst  . Direct laryngoscopy Right 06/11/2015    Procedure: suspension microdirect laryngoscopy with biopsy of right tongue base;  Surgeon: Carloyn Manner, MD;  Location: [facility];  Service: ENT;  Laterality: Right;   Family History  Problem Relation Age of Onset  . Arthritis Mother   . Hypertension Mother   . Hyperlipidemia Mother   . Atrial fibrillation Mother   . Heart disease Mother   . Diabetes Mother   . Hyperlipidemia Father     . Heart disease Father     s/p CABG  . Stroke Father   . Rheumatic fever Sister     s/p valve replacement  . Deep vein thrombosis Brother     after immobilization    Social History   Social History  . Marital Status: Single    Spouse Name: N/A  . Number of Children: 2  . Years of Education: N/A   Occupational History  . Sales     [employer]    Social History Main Topics  . Smoking status: Never Smoker   . Smokeless tobacco: Never Used  . Alcohol Use: No  . Drug Use: No  . Sexual Activity: Not Asked   Other Topics Concern  . None   Social History Narrative   No regular exercise   Lives in noisy environment- very stressful    Daily caffeine use     Outpatient Encounter Prescriptions as of 10/08/2015  Medication Sig  . ALPRAZolam (XANAX) 0.25 MG tablet TAKE 1 TABLET BY MOUTH DAILY AS NEEDED FOR ANXIETY  . Azelastine-Fluticasone (DYMISTA) 137-50 MCG/ACT SUSP Place into the nose.  . bifidobacterium infantis (ALIGN) capsule Take  1 capsule by mouth daily.    . calcium-vitamin D (OSCAL WITH D 500-200) 500-200 MG-UNIT per tablet Take 1 tablet by mouth daily.    . cetirizine (ZYRTEC) 10 MG chewable tablet Chew 10 mg by mouth daily.   . Coenzyme Q10 (COQ10 PO) Take by mouth daily.  Marland Kitchen EPINEPHrine (EPIPEN IJ) Inject as directed.  . fish oil-omega-3 fatty acids 1000 MG capsule Take 2 g by mouth daily.  Marland Kitchen ibuprofen (ADVIL,MOTRIN) 200 MG tablet Take 200 mg by mouth as directed.    . multivitamin (THERAGRAN) per tablet Take 1 tablet by mouth daily.    . polyethylene glycol (MIRALAX) powder Take 17 g by mouth as needed.   . pravastatin (PRAVACHOL) 10 MG tablet TAKE 1 TABLET BY MOUTH DAILY**NEEDS APPT**  . Triamcinolone Acetonide (NASACORT AQ NA) Place into the nose.  . [DISCONTINUED] ciprofloxacin (CIPRO) 500 MG tablet Take 1 tablet (500 mg total) by mouth 2 (two) times daily.  . [DISCONTINUED] FLUoxetine (PROZAC) 10 MG capsule TAKE 1 CAPSULE BY MOUTH DAILY.  Marland Kitchen  FLUoxetine (PROZAC) 20 MG tablet Take 1 tablet (20 mg total) by mouth daily.  Marland Kitchen neomycin-polymyxin-hydrocortisone (CORTISPORIN) 3.5-10000-1 otic suspension Place 3 drops into both ears 3 (three) times daily as needed.  Marland Kitchen tiZANidine (ZANAFLEX) 4 MG tablet Take 1 tablet (4 mg total) by mouth at bedtime as needed for muscle spasms.  . [DISCONTINUED] Azelastine HCl (ASTEPRO NA) Place into the nose.  . [DISCONTINUED] FLUoxetine (PROZAC) 10 MG capsule TAKE 1 CAPSULE BY MOUTH DAILY.   No facility-administered encounter medications on file as of 10/08/2015.    Review of Systems  Constitutional: Negative for appetite change and unexpected weight change.  HENT: Negative for congestion and sinus pressure.   Eyes: Negative for pain and visual disturbance.  Respiratory: Negative for cough, chest tightness and shortness of breath.   Cardiovascular: Negative for chest pain, palpitations and leg swelling.  Gastrointestinal: Positive for nausea. Negative for vomiting, abdominal pain and diarrhea.  Genitourinary: Negative for dysuria and difficulty urinating.  Musculoskeletal: Negative for back pain and joint swelling.       Neck tight as outlined.    Skin: Negative for color change and rash.  Neurological: Positive for light-headedness. Negative for dizziness and headaches.  Hematological: Negative for adenopathy. Does not bruise/bleed easily.  Psychiatric/Behavioral: Negative for dysphoric mood.       Increased stress as outlined.  Some increased depression.         Objective:    Physical Exam  Constitutional: She is oriented to person, place, and time. She appears well-developed and well-nourished. No distress.  HENT:  Nose: Nose normal.  Mouth/Throat: Oropharynx is clear and moist.  Eyes: Right eye exhibits no discharge. Left eye exhibits no discharge. No scleral icterus.  Neck: Neck supple. No thyromegaly present.  Cardiovascular: Normal rate and regular rhythm.   Pulmonary/Chest: Breath  sounds normal. No accessory muscle usage. No tachypnea. No respiratory distress. She has no decreased breath sounds. She has no wheezes. She has no rhonchi. Right breast exhibits no inverted nipple, no mass, no nipple discharge and no tenderness (no axillary adenopathy). Left breast exhibits no inverted nipple, no mass, no nipple discharge and no tenderness (no axilarry adenopathy).  Abdominal: Soft. Bowel sounds are normal. There is no tenderness.  Musculoskeletal: She exhibits no edema or tenderness.  Lymphadenopathy:    She has no cervical adenopathy.  Neurological: She is alert and oriented to person, place, and time.  Skin: Skin is warm. No rash  noted. No erythema.  Psychiatric: She has a normal mood and affect. Her behavior is normal.    BP 128/82 mmHg  Pulse 56  Temp(Src) 98.3 F (36.8 C) (Oral)  Resp 18  Ht 5\' 4"  (1.626 m)  Wt 164 lb (74.39 kg)  BMI 28.14 kg/m2  SpO2 95%  LMP 12/12/1981 Wt Readings from Last 3 Encounters:  10/08/15 164 lb (74.39 kg)  09/01/15 167 lb 6 oz (75.921 kg)  06/11/15 180 lb (81.647 kg)     Lab Results  Component Value Date   WBC 5.9 10/13/2015   HGB 13.7 10/13/2015   HCT 41.4 10/13/2015   PLT 324.0 10/13/2015   GLUCOSE 82 10/13/2015   CHOL 184 10/13/2015   TRIG 98.0 10/13/2015   HDL 59.00 10/13/2015   LDLDIRECT 155.8 06/27/2013   LDLCALC 106* 10/13/2015   ALT 13 10/13/2015   AST 15 10/13/2015   NA 141 10/13/2015   K 3.7 10/13/2015   CL 105 10/13/2015   CREATININE 1.00 10/13/2015   BUN 8 10/13/2015   CO2 26 10/13/2015   TSH 0.37 10/13/2015       Assessment & Plan:   Problem List Items Addressed This Visit    Anxiety    Increased stress as outlined.  On prozac.  Will increase to 20mg  q day.  Follow.  Get her back in soon to reassess.        Relevant Medications   FLUoxetine (PROZAC) 20 MG tablet   COLONIC POLYPS, HYPERPLASTIC, HX OF    Colonoscopy 2010.  Due f/u colonoscopy 2020.        Environmental allergies    On  zyttec.  Follow.  Has nasacort.       GERD (gastroesophageal reflux disease)    Some nausea, but no other upper symptoms reported.  Follow.        IRRITABLE BOWEL SYNDROME    Bowels stable.       LLQ pain    Some persistent pain.  Weight loss.  Some nausea.  Will check abdominal ultrasound and pelvic ultrasound.  Further w/up pending.        Neck tightness    Has been an intermittent issue for her.  Has taken flexeril previously.  Will try zanaflex.  Follow.        Routine general medical examination at a health care facility    Physical today 10/08/15.  Schedule mammogram.  Colonoscopy 2010.  Due f/u colonoscopy in 2020.         Other Visit Diagnoses    Pelvic fullness    -  Primary    Relevant Orders    US Pelvis Complete (Completed)    US Transvaginal Non-OB (Completed)    US Abdomen Complete (Completed)    Breast cancer screening        Relevant Orders    MM DIGITAL SCREENING BILATERAL    Loss of weight        Relevant Orders    US Pelvis Complete (Completed)    US Transvaginal Non-OB (Completed)    US Abdomen Complete (Completed)    LUQ pain        Relevant Orders    US Abdomen Complete (Completed)        [name], MD

## 2015-10-08 NOTE — Progress Notes (Signed)
Pre-visit discussion using our clinic review tool. No additional management support is needed unless otherwise documented below in the visit note.  

## 2015-10-09 ENCOUNTER — Encounter: Payer: Self-pay | Admitting: Internal Medicine

## 2015-10-12 ENCOUNTER — Ambulatory Visit
Admission: RE | Admit: 2015-10-12 | Discharge: 2015-10-12 | Disposition: A | Discharge status: Home / Self Care | Payer: Managed Care, Other (non HMO) | Source: Ambulatory Visit | Attending: Internal Medicine | Admitting: Internal Medicine

## 2015-10-12 ENCOUNTER — Ambulatory Visit: Payer: Managed Care, Other (non HMO)

## 2015-10-12 DIAGNOSIS — R1012 Left upper quadrant pain: Secondary | ICD-10-CM | POA: Diagnosis present

## 2015-10-12 DIAGNOSIS — R110 Nausea: Secondary | ICD-10-CM | POA: Insufficient documentation

## 2015-10-12 DIAGNOSIS — Z90710 Acquired absence of both cervix and uterus: Secondary | ICD-10-CM | POA: Insufficient documentation

## 2015-10-12 DIAGNOSIS — R634 Abnormal weight loss: Secondary | ICD-10-CM | POA: Insufficient documentation

## 2015-10-12 DIAGNOSIS — Z9049 Acquired absence of other specified parts of digestive tract: Secondary | ICD-10-CM | POA: Diagnosis not present

## 2015-10-12 DIAGNOSIS — R1900 Intra-abdominal and pelvic swelling, mass and lump, unspecified site: Secondary | ICD-10-CM

## 2015-10-13 ENCOUNTER — Other Ambulatory Visit (INDEPENDENT_AMBULATORY_CARE_PROVIDER_SITE_OTHER): Payer: Managed Care, Other (non HMO)

## 2015-10-13 ENCOUNTER — Encounter: Payer: Self-pay | Admitting: Internal Medicine

## 2015-10-13 DIAGNOSIS — E039 Hypothyroidism, unspecified: Secondary | ICD-10-CM | POA: Diagnosis not present

## 2015-10-13 DIAGNOSIS — E7800 Pure hypercholesterolemia, unspecified: Secondary | ICD-10-CM

## 2015-10-13 LAB — COMPREHENSIVE METABOLIC PANEL
ALK PHOS: 66 U/L (ref 39–117)
ALT: 13 U/L (ref 0–35)
AST: 15 U/L (ref 0–37)
Albumin: 4 g/dL (ref 3.5–5.2)
BILIRUBIN TOTAL: 0.6 mg/dL (ref 0.2–1.2)
BUN: 8 mg/dL (ref 6–23)
CALCIUM: 9.4 mg/dL (ref 8.4–10.5)
CO2: 26 mEq/L (ref 19–32)
Chloride: 105 mEq/L (ref 96–112)
Creatinine, Ser: 1 mg/dL (ref 0.40–1.20)
GFR: 59.47 mL/min — ABNORMAL LOW (ref >60.00)
Glucose, Bld: 82 mg/dL (ref 70–99)
POTASSIUM: 3.7 meq/L (ref 3.5–5.1)
Sodium: 141 mEq/L (ref 135–145)
TOTAL PROTEIN: 7.1 g/dL (ref 6.0–8.3)

## 2015-10-13 LAB — CBC WITH DIFFERENTIAL/PLATELET
BASOS ABS: 0.1 10*3/uL (ref 0.0–0.1)
Basophils Relative: 1.1 % (ref 0.0–3.0)
EOS PCT: 1.4 % (ref 0.0–5.0)
Eosinophils Absolute: 0.1 10*3/uL (ref 0.0–0.7)
HEMATOCRIT: 41.4 % (ref 36.0–46.0)
Hemoglobin: 13.7 g/dL (ref 12.0–15.0)
Lymphocytes Relative: 25.5 % (ref 12.0–46.0)
Lymphs Abs: 1.5 10*3/uL (ref 0.7–4.0)
MCHC: 33.1 g/dL (ref 30.0–36.0)
MCV: 88.8 fl (ref 78.0–100.0)
Monocytes Absolute: 0.7 10*3/uL (ref 0.1–1.0)
Monocytes Relative: 11.6 % (ref 3.0–12.0)
NEUTROS PCT: 60.4 % (ref 43.0–77.0)
Neutro Abs: 3.6 10*3/uL (ref 1.4–7.7)
Platelets: 324 10*3/uL (ref 150.0–400.0)
RBC: 4.67 Mil/uL (ref 3.87–5.11)
RDW: 13.1 % (ref 11.5–15.5)
WBC: 5.9 10*3/uL (ref 4.0–10.5)

## 2015-10-13 LAB — LIPID PANEL
CHOLESTEROL: 184 mg/dL (ref 0–200)
HDL: 59 mg/dL (ref >39.00)
LDL Cholesterol: 106 mg/dL — ABNORMAL HIGH (ref 0–99)
NonHDL: 125.44
TRIGLYCERIDES: 98 mg/dL (ref 0.0–149.0)
Total CHOL/HDL Ratio: 3
VLDL: 19.6 mg/dL (ref 0.0–40.0)

## 2015-10-13 LAB — TSH: TSH: 0.37 u[IU]/mL (ref 0.35–4.50)

## 2015-10-15 ENCOUNTER — Encounter: Payer: Self-pay | Admitting: Gastroenterology

## 2015-10-18 ENCOUNTER — Encounter: Payer: Self-pay | Admitting: Internal Medicine

## 2015-10-18 DIAGNOSIS — R1032 Left lower quadrant pain: Secondary | ICD-10-CM | POA: Insufficient documentation

## 2015-10-18 DIAGNOSIS — R29898 Other symptoms and signs involving the musculoskeletal system: Secondary | ICD-10-CM | POA: Insufficient documentation

## 2015-10-18 NOTE — Assessment & Plan Note (Signed)
Increased stress as outlined.  On prozac.  Will increase to 20mg  q day.  Follow.  Get her back in soon to reassess.

## 2015-10-18 NOTE — Assessment & Plan Note (Signed)
Bowels stable.  

## 2015-10-18 NOTE — Assessment & Plan Note (Signed)
Some persistent pain.  Weight loss.  Some nausea.  Will check abdominal ultrasound and pelvic ultrasound.  Further w/up pending.

## 2015-10-18 NOTE — Assessment & Plan Note (Signed)
On zyttec.  Follow.  Has nasacort.

## 2015-10-18 NOTE — Assessment & Plan Note (Signed)
Has been an intermittent issue for her.  Has taken flexeril previously.  Will try zanaflex.  Follow.

## 2015-10-18 NOTE — Assessment & Plan Note (Signed)
Colonoscopy 2010.  Due f/u colonoscopy 2020.

## 2015-10-18 NOTE — Assessment & Plan Note (Signed)
Physical today 10/08/15.  Schedule mammogram.  Colonoscopy 2010.  Due f/u colonoscopy in 2020.

## 2015-10-18 NOTE — Assessment & Plan Note (Signed)
Some nausea, but no other upper symptoms reported.  Follow.

## 2015-10-19 ENCOUNTER — Encounter: Payer: Self-pay | Admitting: Internal Medicine

## 2015-10-19 ENCOUNTER — Ambulatory Visit
Admission: RE | Admit: 2015-10-19 | Discharge: 2015-10-19 | Disposition: A | Discharge status: Home / Self Care | Payer: Managed Care, Other (non HMO) | Source: Ambulatory Visit | Attending: Internal Medicine | Admitting: Internal Medicine

## 2015-10-19 DIAGNOSIS — Z1231 Encounter for screening mammogram for malignant neoplasm of breast: Secondary | ICD-10-CM | POA: Insufficient documentation

## 2015-10-19 DIAGNOSIS — Z1239 Encounter for other screening for malignant neoplasm of breast: Secondary | ICD-10-CM

## 2015-10-21 ENCOUNTER — Encounter: Payer: Self-pay | Admitting: Internal Medicine

## 2015-10-21 DIAGNOSIS — Z0000 Encounter for general adult medical examination without abnormal findings: Secondary | ICD-10-CM | POA: Insufficient documentation

## 2015-11-02 ENCOUNTER — Other Ambulatory Visit: Payer: Self-pay | Admitting: Internal Medicine

## 2015-11-07 ENCOUNTER — Other Ambulatory Visit: Payer: Self-pay | Admitting: Internal Medicine

## 2015-11-18 ENCOUNTER — Encounter: Payer: Self-pay | Admitting: Internal Medicine

## 2015-11-19 ENCOUNTER — Telehealth: Payer: Self-pay | Admitting: Internal Medicine

## 2015-11-19 NOTE — Telephone Encounter (Signed)
Please advise 

## 2015-11-19 NOTE — Telephone Encounter (Signed)
Placed in red folder  

## 2015-11-19 NOTE — Telephone Encounter (Signed)
Pt has dropped off paper work for Dr. Nicki Reaper to fill out. Pt already spoke to Dr. Nicki Reaper. Paper work is up front.  Thank you, kp

## 2015-11-20 NOTE — Telephone Encounter (Signed)
Will complete 

## 2015-11-21 ENCOUNTER — Other Ambulatory Visit: Payer: Self-pay | Admitting: Internal Medicine

## 2015-12-01 ENCOUNTER — Ambulatory Visit (INDEPENDENT_AMBULATORY_CARE_PROVIDER_SITE_OTHER): Payer: Managed Care, Other (non HMO) | Admitting: Internal Medicine

## 2015-12-01 ENCOUNTER — Encounter: Payer: Self-pay | Admitting: Internal Medicine

## 2015-12-01 VITALS — BP 130/80 | HR 67 | Temp 97.9°F | Resp 18 | Ht 64.0 in | Wt 159.0 lb

## 2015-12-01 DIAGNOSIS — K589 Irritable bowel syndrome without diarrhea: Secondary | ICD-10-CM | POA: Diagnosis not present

## 2015-12-01 DIAGNOSIS — E039 Hypothyroidism, unspecified: Secondary | ICD-10-CM

## 2015-12-01 DIAGNOSIS — K219 Gastro-esophageal reflux disease without esophagitis: Secondary | ICD-10-CM | POA: Diagnosis not present

## 2015-12-01 DIAGNOSIS — E7800 Pure hypercholesterolemia, unspecified: Secondary | ICD-10-CM

## 2015-12-01 DIAGNOSIS — F419 Anxiety disorder, unspecified: Secondary | ICD-10-CM | POA: Diagnosis not present

## 2015-12-01 DIAGNOSIS — Z86010 Personal history of colonic polyps: Secondary | ICD-10-CM

## 2015-12-01 DIAGNOSIS — IMO0001 Reserved for inherently not codable concepts without codable children: Secondary | ICD-10-CM

## 2015-12-01 DIAGNOSIS — R030 Elevated blood-pressure reading, without diagnosis of hypertension: Secondary | ICD-10-CM

## 2015-12-01 MED ORDER — PRAVASTATIN SODIUM 10 MG PO TABS: 10.0000 mg | ORAL_TABLET | Freq: Every day | ORAL | Status: DC

## 2015-12-01 NOTE — Progress Notes (Signed)
Pre-visit discussion using our clinic review tool. No additional management support is needed unless otherwise documented below in the visit note.  

## 2015-12-01 NOTE — Progress Notes (Signed)
Patient ID: Jill Torres, female   DOB: 1951-11-03, 64 y.o.   MRN: [id number]   Subjective:    Patient ID: Jill Torres, female    DOB: Oct 28, 1951, 64 y.o.   MRN: [id number]  HPI  Patient with past history of hypercholesterolemia, GERD, depression and hypothyroidism.  She comes in today to follow up on these issues.  She is on increased prozac.  Overall doing better.  Feels better.  Her mother did recently pass away.  Increased stress with this.  Had to be out of work for a brief period.  May need to be out of work intermittently with her father and her visits.  Form to be completed.  No abdominal pain or cramping.  Eating and drinking.  Bowels stable.  Does not feel she needs any further intervention.    Past Medical History  Diagnosis Date  . Unspecified hypothyroidism   . Irritable bowel syndrome   . Diverticulosis of colon (without mention of hemorrhage)   . Arthritis   . Allergy   . GERD (gastroesophageal reflux disease)   . History of chicken pox   . History of shingles may 2013  . Depression    Past Surgical History  Procedure Laterality Date  . Cesarean section      x 2  . Cholecystectomy      x 2  . Dilation and curettage of uterus      x 2  . Partial hysterectomy  1983    cyst on ovary and heavy bleeding   . Bladder diverticulectomy  1990  . Nasal septum surgery  1999  . Appendectomy  1974  . Abdominal hysterectomy  1983    due to heavy bleeding & ovary cyst  . Direct laryngoscopy Right 06/11/2015    Procedure: suspension microdirect laryngoscopy with biopsy of right tongue base;  Surgeon: Carloyn Manner, MD;  Location: [facility];  Service: ENT;  Laterality: Right;   Family History  Problem Relation Age of Onset  . Arthritis Mother   . Hypertension Mother   . Hyperlipidemia Mother   . Atrial fibrillation Mother   . Heart disease Mother   . Diabetes Mother   . Hyperlipidemia Father   . Heart disease Father     s/p CABG  . Stroke Father   . Rheumatic  fever Sister     s/p valve replacement  . Deep vein thrombosis Brother     after immobilization   . Breast cancer Neg Hx    Social History   Social History  . Marital Status: Single    Spouse Name: N/A  . Number of Children: 2  . Years of Education: N/A   Occupational History  . Sales     [employer]    Social History Main Topics  . Smoking status: Never Smoker   . Smokeless tobacco: Never Used  . Alcohol Use: No  . Drug Use: No  . Sexual Activity: Not Asked   Other Topics Concern  . None   Social History Narrative   No regular exercise   Lives in noisy environment- very stressful    Daily caffeine use     Outpatient Encounter Prescriptions as of 12/01/2015  Medication Sig  . ALPRAZolam (XANAX) 0.25 MG tablet TAKE 1 TABLET BY MOUTH DAILY AS NEEDED FOR ANXIETY  . Azelastine-Fluticasone (DYMISTA) 137-50 MCG/ACT SUSP Place into the nose.  . bifidobacterium infantis (ALIGN) capsule Take 1 capsule by mouth daily.    . calcium-vitamin D (  OSCAL WITH D 500-200) 500-200 MG-UNIT per tablet Take 1 tablet by mouth daily.    . cetirizine (ZYRTEC) 10 MG chewable tablet Chew 10 mg by mouth daily.   . Coenzyme Q10 (COQ10 PO) Take by mouth daily.  Marland Kitchen EPINEPHrine (EPIPEN IJ) Inject as directed.  . fish oil-omega-3 fatty acids 1000 MG capsule Take 2 g by mouth daily.  Marland Kitchen FLUoxetine (PROZAC) 20 MG tablet Take 1 tablet (20 mg total) by mouth daily.  Marland Kitchen ibuprofen (ADVIL,MOTRIN) 200 MG tablet Take 200 mg by mouth as directed.    . multivitamin (THERAGRAN) per tablet Take 1 tablet by mouth daily.    Marland Kitchen neomycin-polymyxin-hydrocortisone (CORTISPORIN) 3.5-10000-1 otic suspension Place 3 drops into both ears 3 (three) times daily as needed.  . polyethylene glycol (MIRALAX) powder Take 17 g by mouth as needed.   . pravastatin (PRAVACHOL) 10 MG tablet Take 1 tablet (10 mg total) by mouth daily.  Marland Kitchen tiZANidine (ZANAFLEX) 4 MG tablet TAKE 1 TABLET (4 MG TOTAL) BY MOUTH AT BEDTIME AS NEEDED  FOR MUSCLE SPASMS.  . Triamcinolone Acetonide (NASACORT AQ NA) Place into the nose.  . [DISCONTINUED] pravastatin (PRAVACHOL) 10 MG tablet TAKE 1 TABLET BY MOUTH DAILY**NEEDS APPT** (Patient taking differently: TAKE 1 TABLET BY MOUTH DAILY)   No facility-administered encounter medications on file as of 12/01/2015.    Review of Systems  Constitutional: Negative for appetite change and unexpected weight change.  HENT: Negative for congestion and sinus pressure.   Respiratory: Negative for cough, chest tightness and shortness of breath.   Cardiovascular: Negative for chest pain, palpitations and leg swelling.  Gastrointestinal: Negative for nausea, vomiting, abdominal pain and diarrhea.  Genitourinary: Negative for dysuria and difficulty urinating.  Musculoskeletal: Negative for back pain and joint swelling.  Skin: Negative for color change and rash.  Neurological: Negative for dizziness, light-headedness and headaches.  Psychiatric/Behavioral: Negative for dysphoric mood and agitation.       Objective:    Physical Exam  Constitutional: She appears well-developed and well-nourished. No distress.  HENT:  Nose: Nose normal.  Mouth/Throat: Oropharynx is clear and moist.  Eyes: Conjunctivae are normal. Right eye exhibits no discharge. Left eye exhibits no discharge.  Neck: Neck supple. No thyromegaly present.  Cardiovascular: Normal rate and regular rhythm.   Pulmonary/Chest: Breath sounds normal. No respiratory distress. She has no wheezes.  Abdominal: Soft. Bowel sounds are normal. There is no tenderness.  Musculoskeletal: She exhibits no edema or tenderness.  Lymphadenopathy:    She has no cervical adenopathy.  Skin: No rash noted. No erythema.  Psychiatric: She has a normal mood and affect. Her behavior is normal.    BP 130/80 mmHg  Pulse 67  Temp(Src) 97.9 F (36.6 C) (Oral)  Resp 18  Ht 5\' 4"  (1.626 m)  Wt 159 lb (72.122 kg)  BMI 27.28 kg/m2  SpO2 98%  LMP  12/12/1981 Wt Readings from Last 3 Encounters:  12/01/15 159 lb (72.122 kg)  10/08/15 164 lb (74.39 kg)  09/01/15 167 lb 6 oz (75.921 kg)     Lab Results  Component Value Date   WBC 5.9 10/13/2015   HGB 13.7 10/13/2015   HCT 41.4 10/13/2015   PLT 324.0 10/13/2015   GLUCOSE 82 10/13/2015   CHOL 184 10/13/2015   TRIG 98.0 10/13/2015   HDL 59.00 10/13/2015   LDLDIRECT 155.8 06/27/2013   LDLCALC 106* 10/13/2015   ALT 13 10/13/2015   AST 15 10/13/2015   NA 141 10/13/2015   K 3.7 10/13/2015  CL 105 10/13/2015   CREATININE 1.00 10/13/2015   BUN 8 10/13/2015   CO2 26 10/13/2015   TSH 0.37 10/13/2015    Mm Digital Screening Bilateral  10/20/2015  CLINICAL DATA:  Screening. EXAM: DIGITAL SCREENING BILATERAL MAMMOGRAM WITH CAD COMPARISON:  Previous exam(s). ACR Breast Density Category b: There are scattered areas of fibroglandular density. FINDINGS: There are no findings suspicious for malignancy. Images were processed with CAD. IMPRESSION: No mammographic evidence of malignancy. A result letter of this screening mammogram will be mailed directly to the patient. RECOMMENDATION: Screening mammogram in one year. (Code:SM-B-01Y) BI-RADS CATEGORY  1: Negative. Electronically Signed   By: Everlean Alstrom M.D.   On: 10/20/2015 07:46       Assessment & Plan:   Problem List Items Addressed This Visit    Anxiety - Primary    Mother recently passed away.  Overall handling things relatively well.  On prozac.  Follow.        COLONIC POLYPS, HYPERPLASTIC, HX OF    Colonoscopy 2010.  Recommended f/u colonoscopy 2020.        Elevated blood pressure    Blood pressure doing well.  Follow pressures.  Follow metabolic panel.        GERD (gastroesophageal reflux disease)    No upper symptoms reported.  Follow.       Hypothyroidism    On no medication now.  Follow tsh.        IRRITABLE BOWEL SYNDROME    Bowels stable.         Other Visit Diagnoses    Hypercholesterolemia         Relevant Medications    pravastatin (PRAVACHOL) 10 MG tablet    Other Relevant Orders    Comprehensive metabolic panel    Lipid panel        [name], MD

## 2015-12-07 ENCOUNTER — Encounter: Payer: Self-pay | Admitting: Internal Medicine

## 2015-12-07 NOTE — Assessment & Plan Note (Signed)
No upper symptoms reported.  Follow.   

## 2015-12-07 NOTE — Assessment & Plan Note (Signed)
On no medication now.  Follow tsh.

## 2015-12-07 NOTE — Assessment & Plan Note (Signed)
Colonoscopy 2010.  Recommended f/u colonoscopy 2020.

## 2015-12-07 NOTE — Assessment & Plan Note (Signed)
Blood pressure doing well.  Follow pressures.  Follow metabolic panel.  

## 2015-12-07 NOTE — Assessment & Plan Note (Signed)
Bowels stable.  

## 2015-12-07 NOTE — Assessment & Plan Note (Signed)
Mother recently passed away.  Overall handling things relatively well.  On prozac.  Follow.

## 2015-12-11 ENCOUNTER — Other Ambulatory Visit: Payer: Self-pay | Admitting: Internal Medicine

## 2015-12-26 ENCOUNTER — Other Ambulatory Visit: Payer: Self-pay | Admitting: Internal Medicine

## 2015-12-28 NOTE — Telephone Encounter (Signed)
Pt last OV 12/01/15 , last filled 10/08/15 #30 tabs with 2 refills.Please advise/tvw

## 2015-12-28 NOTE — Telephone Encounter (Signed)
ok'd refill for prozac #30 with 2 refills.   

## 2015-12-29 IMAGING — US US PELVIS COMPLETE
1 series · 14 of 25 positions shown · non-contrast
Comparison: Abdominal pelvic CT scan November 29, 2010

CLINICAL DATA: Pelvic fullness, weight loss, history of
hysterectomy, duration of symptoms 2 months

EXAM:
TRANSABDOMINAL AND TRANSVAGINAL ULTRASOUND OF PELVIS
TECHNIQUE: Both transabdominal and transvaginal ultrasound examinations of the
pelvis were performed. Transabdominal technique was performed for
global imaging of the pelvis including uterus, ovaries, adnexal
regions, and pelvic cul-de-sac. It was necessary to proceed with
endovaginal exam following the transabdominal exam to visualize the
adnexal regions.

[Series 1: us pelvis complete · 0.24mm/px · 14 of 81 slices shown]
[im 1/81]
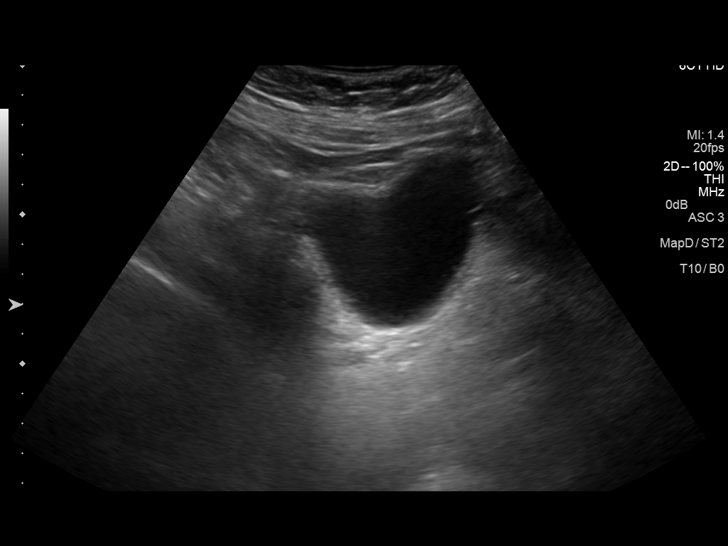
[im 7/81]
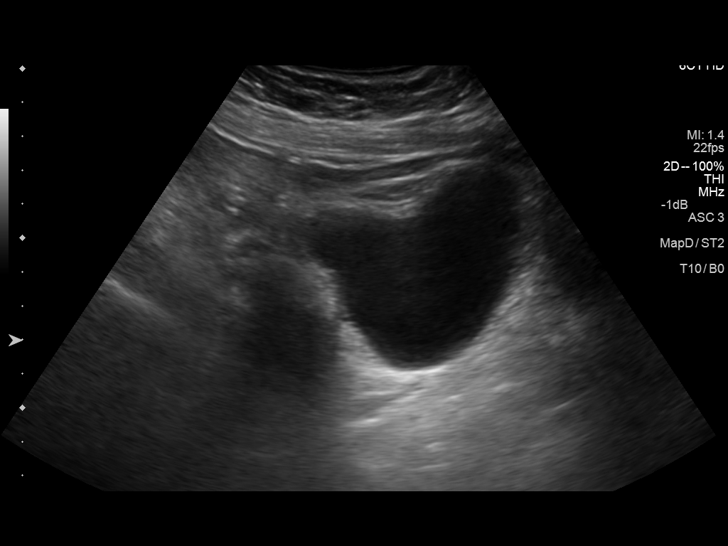
[im 14/81]
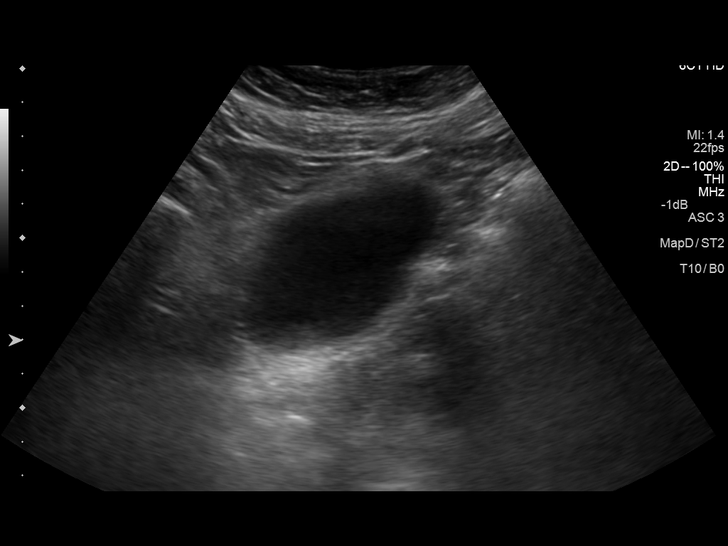
[im 21/81]
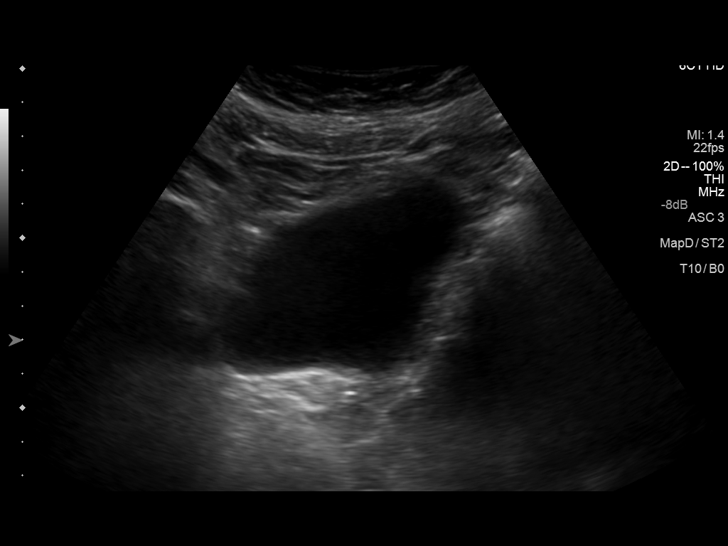
[im 27/81]
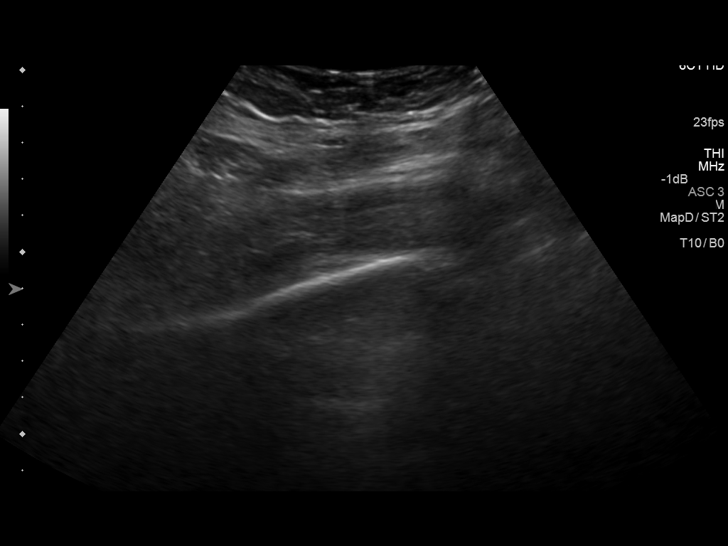
[im 31/81]
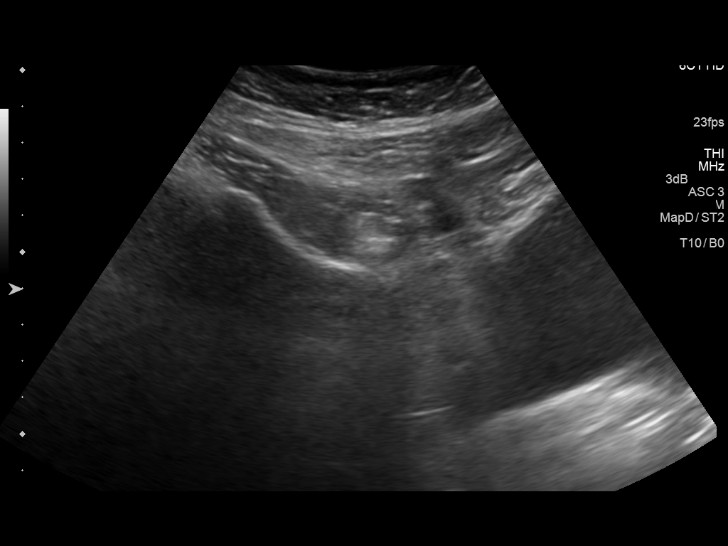
[im 37/81]
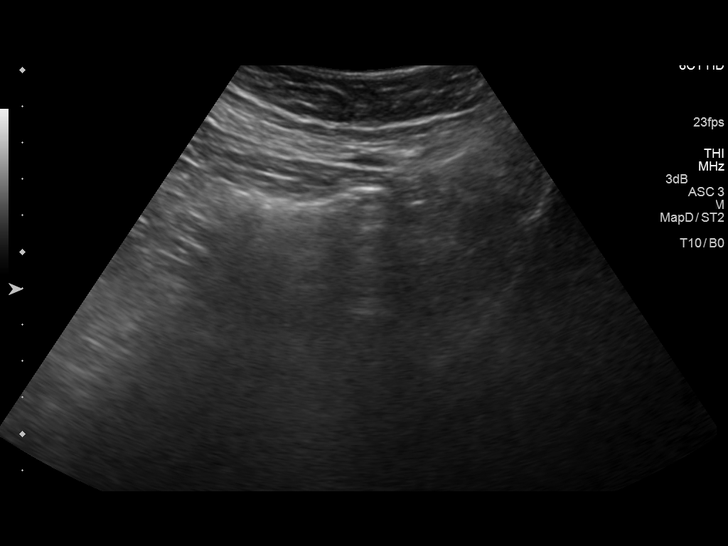
[im 44/81]
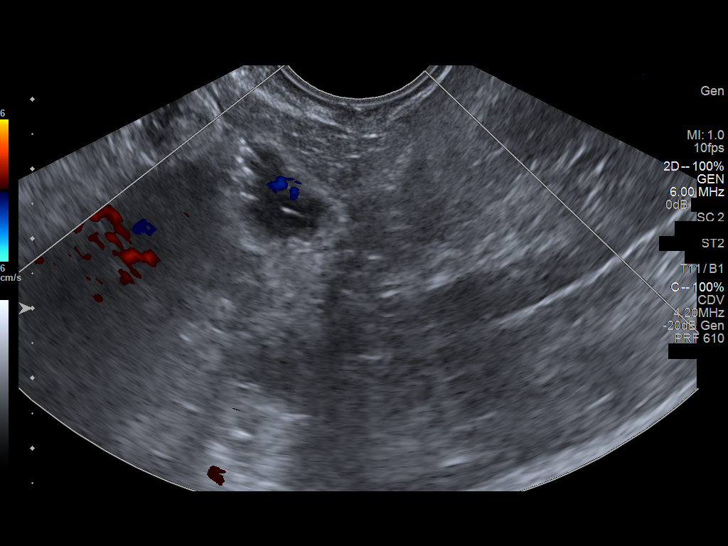
[im 51/81]
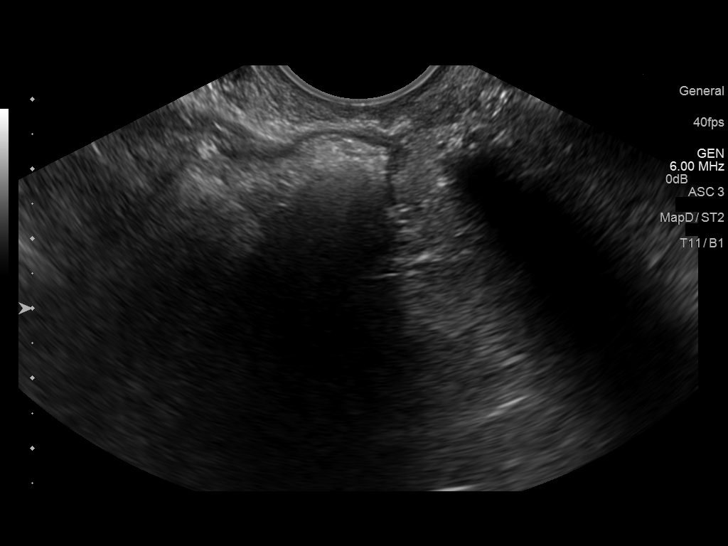
[im 54/81]
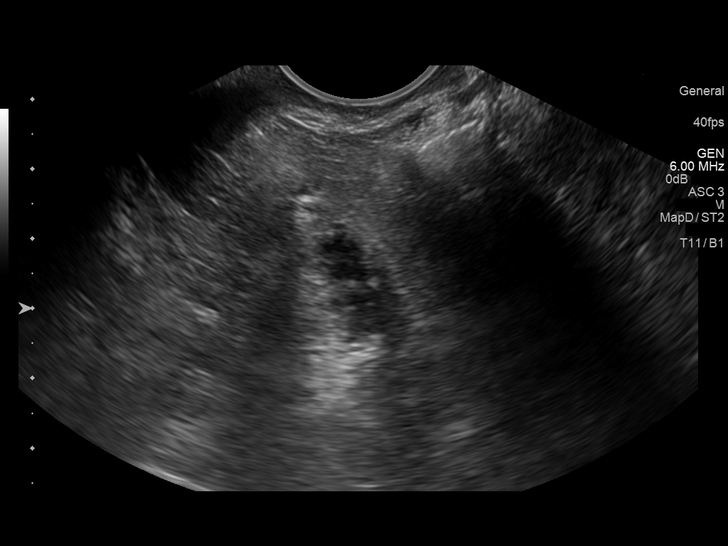
[im 61/81]
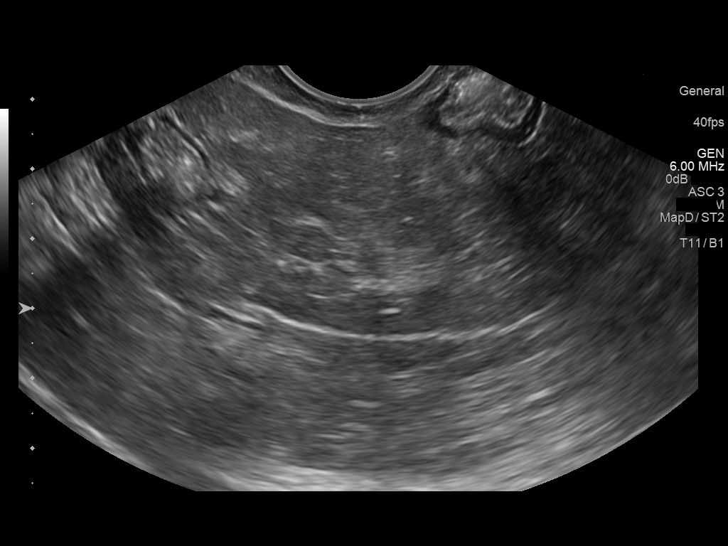
[im 67/81]
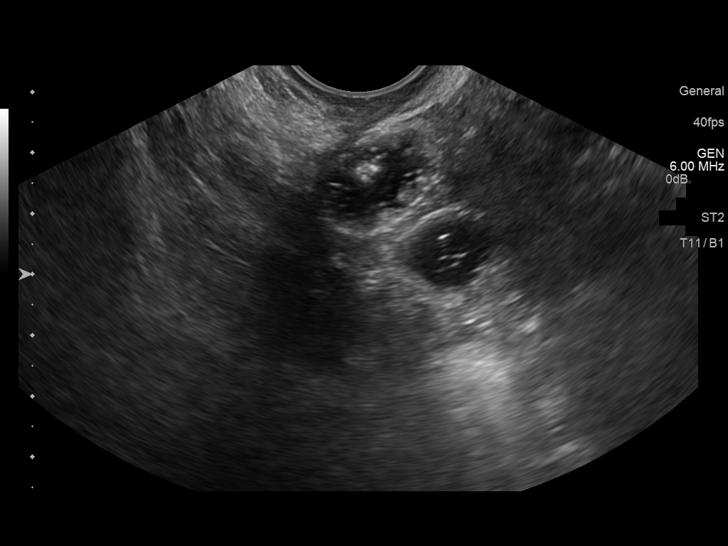
[im 74/81]
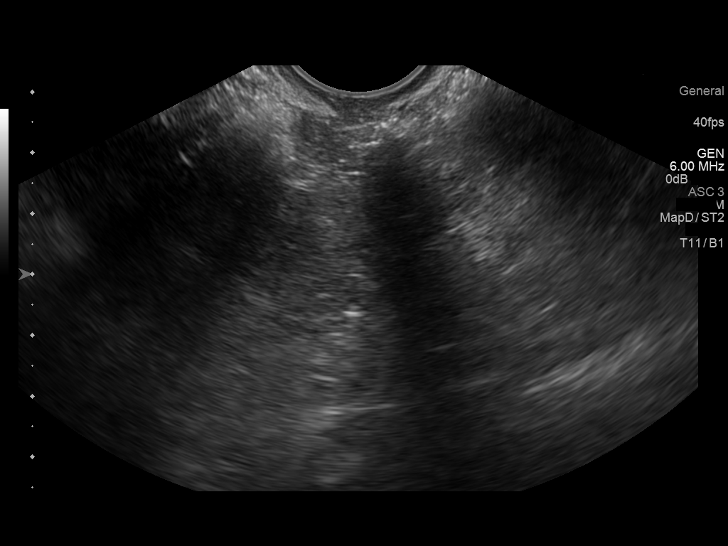
[im 81/81]
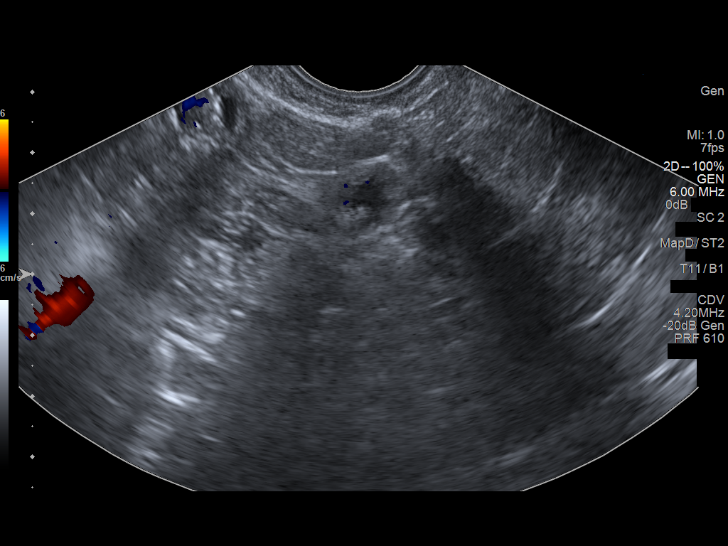

[14 of 25 positions shown; findings below may reference images not displayed]

FINDINGS: Uterus

The uterus is surgically absent

The ovaries could not be demonstrated. No adnexal masses are
observed.

Other findings

No free fluid.
IMPRESSION: The uterus is surgically absent. The ovaries could not be
demonstrated. No adnexal masses or free pelvic fluid are
demonstrated.

## 2015-12-31 ENCOUNTER — Other Ambulatory Visit: Payer: Self-pay | Admitting: Internal Medicine

## 2016-01-11 ENCOUNTER — Other Ambulatory Visit: Payer: Self-pay | Admitting: Internal Medicine

## 2016-01-21 ENCOUNTER — Encounter (HOSPITAL_COMMUNITY): Payer: Managed Care, Other (non HMO)

## 2016-02-15 ENCOUNTER — Other Ambulatory Visit: Payer: Self-pay | Admitting: Internal Medicine

## 2016-02-23 ENCOUNTER — Encounter: Payer: Self-pay | Admitting: Internal Medicine

## 2016-02-24 ENCOUNTER — Encounter: Payer: Self-pay | Admitting: Internal Medicine

## 2016-02-24 NOTE — Telephone Encounter (Signed)
See other my chart message.  Nurse to call to get more information.

## 2016-02-24 NOTE — Telephone Encounter (Signed)
Need more information.  Does she have a history of inner ear?  Does this feel similar?   What symptoms having now and when did this start?  If no history and acute onset and especially if any acute neuro symptoms, needs evaluation today adn then we can f/u next week.

## 2016-02-26 ENCOUNTER — Encounter: Payer: Self-pay | Admitting: Internal Medicine

## 2016-02-29 ENCOUNTER — Encounter: Payer: Self-pay | Admitting: Internal Medicine

## 2016-02-29 ENCOUNTER — Other Ambulatory Visit (INDEPENDENT_AMBULATORY_CARE_PROVIDER_SITE_OTHER): Payer: Managed Care, Other (non HMO)

## 2016-02-29 DIAGNOSIS — E7800 Pure hypercholesterolemia, unspecified: Secondary | ICD-10-CM | POA: Diagnosis not present

## 2016-02-29 LAB — COMPREHENSIVE METABOLIC PANEL
ALT: 14 U/L (ref 0–35)
AST: 16 U/L (ref 0–37)
Albumin: 4.1 g/dL (ref 3.5–5.2)
Alkaline Phosphatase: 59 U/L (ref 39–117)
BUN: 10 mg/dL (ref 6–23)
CALCIUM: 9.7 mg/dL (ref 8.4–10.5)
CHLORIDE: 103 meq/L (ref 96–112)
CO2: 29 meq/L (ref 19–32)
CREATININE: 1.09 mg/dL (ref 0.40–1.20)
GFR: 53.78 mL/min — ABNORMAL LOW (ref >60.00)
Glucose, Bld: 90 mg/dL (ref 70–99)
Potassium: 3.8 mEq/L (ref 3.5–5.1)
SODIUM: 140 meq/L (ref 135–145)
Total Bilirubin: 0.7 mg/dL (ref 0.2–1.2)
Total Protein: 7.1 g/dL (ref 6.0–8.3)

## 2016-02-29 LAB — LIPID PANEL
CHOL/HDL RATIO: 4
Cholesterol: 210 mg/dL — ABNORMAL HIGH (ref 0–200)
HDL: 56.5 mg/dL (ref >39.00)
LDL CALC: 131 mg/dL — AB (ref 0–99)
NonHDL: 153.38
TRIGLYCERIDES: 111 mg/dL (ref 0.0–149.0)
VLDL: 22.2 mg/dL (ref 0.0–40.0)

## 2016-03-03 ENCOUNTER — Ambulatory Visit (INDEPENDENT_AMBULATORY_CARE_PROVIDER_SITE_OTHER): Payer: Managed Care, Other (non HMO) | Admitting: Internal Medicine

## 2016-03-03 ENCOUNTER — Encounter: Payer: Self-pay | Admitting: Internal Medicine

## 2016-03-03 VITALS — BP 138/80 | HR 70 | Temp 98.1°F | Resp 18 | Ht 64.0 in | Wt 165.0 lb

## 2016-03-03 DIAGNOSIS — E7800 Pure hypercholesterolemia, unspecified: Secondary | ICD-10-CM

## 2016-03-03 DIAGNOSIS — Z1239 Encounter for other screening for malignant neoplasm of breast: Secondary | ICD-10-CM | POA: Diagnosis not present

## 2016-03-03 DIAGNOSIS — K219 Gastro-esophageal reflux disease without esophagitis: Secondary | ICD-10-CM | POA: Diagnosis not present

## 2016-03-03 DIAGNOSIS — Z0000 Encounter for general adult medical examination without abnormal findings: Secondary | ICD-10-CM | POA: Diagnosis not present

## 2016-03-03 DIAGNOSIS — R030 Elevated blood-pressure reading, without diagnosis of hypertension: Secondary | ICD-10-CM

## 2016-03-03 DIAGNOSIS — F419 Anxiety disorder, unspecified: Secondary | ICD-10-CM

## 2016-03-03 DIAGNOSIS — IMO0001 Reserved for inherently not codable concepts without codable children: Secondary | ICD-10-CM

## 2016-03-03 NOTE — Progress Notes (Signed)
Pre-visit discussion using our clinic review tool. No additional management support is needed unless otherwise documented below in the visit note.  

## 2016-03-03 NOTE — Progress Notes (Signed)
Patient ID: Jill Torres, female   DOB: 04-14-1951, 65 y.o.   MRN: [id number]   Subjective:    Patient ID: Jill Torres, female    DOB: 07/07/1951, 65 y.o.   MRN: [id number]  HPI  Patient here for a scheduled follow up appt.  States she feels better.  Doing better.  Handling stress better.  Stays active.  No chest pain or tightness.  No sob.  No acid reflux.  No abdominal pain or cramping.  Bowels stable.  Has not been watching her diet as well.  Cholesterol increased slightly.  LDL 131.  Discussed diet and exercise.  Discussed medication.  She declines to start cholesterol medication.     Past Medical History  Diagnosis Date  . Unspecified hypothyroidism   . Irritable bowel syndrome   . Diverticulosis of colon (without mention of hemorrhage)   . Arthritis   . Allergy   . GERD (gastroesophageal reflux disease)   . History of chicken pox   . History of shingles may 2013  . Depression    Past Surgical History  Procedure Laterality Date  . Cesarean section      x 2  . Cholecystectomy      x 2  . Dilation and curettage of uterus      x 2  . Partial hysterectomy  1983    cyst on ovary and heavy bleeding   . Bladder diverticulectomy  1990  . Nasal septum surgery  1999  . Appendectomy  1974  . Abdominal hysterectomy  1983    due to heavy bleeding & ovary cyst  . Direct laryngoscopy Right 06/11/2015    Procedure: suspension microdirect laryngoscopy with biopsy of right tongue base;  Surgeon: Carloyn Manner, MD;  Location: [facility] ORS;  Service: ENT;  Laterality: Right;   Family History  Problem Relation Age of Onset  . Arthritis Mother   . Hypertension Mother   . Hyperlipidemia Mother   . Atrial fibrillation Mother   . Heart disease Mother   . Diabetes Mother   . Hyperlipidemia Father   . Heart disease Father     s/p CABG  . Stroke Father   . Rheumatic fever Sister     s/p valve replacement  . Deep vein thrombosis Brother     after immobilization   . Breast cancer  Neg Hx    Social History   Social History  . Marital Status: Single    Spouse Name: N/A  . Number of Children: 2  . Years of Education: N/A   Occupational History  . Sales     [employer]    Social History Main Topics  . Smoking status: Never Smoker   . Smokeless tobacco: Never Used  . Alcohol Use: No  . Drug Use: No  . Sexual Activity: Not Asked   Other Topics Concern  . None   Social History Narrative   No regular exercise   Lives in noisy environment- very stressful    Daily caffeine use     Outpatient Encounter Prescriptions as of 03/03/2016  Medication Sig  . ALPRAZolam (XANAX) 0.25 MG tablet TAKE 1 TABLET BY MOUTH DAILY AS NEEDED FOR ANXIETY  . amoxicillin-clavulanate (AUGMENTIN) 875-125 MG tablet Take 1 tablet by mouth every 12 (twelve) hours.  . Azelastine-Fluticasone (DYMISTA) 137-50 MCG/ACT SUSP Place into the nose.  . bifidobacterium infantis (ALIGN) capsule Take 1 capsule by mouth daily.    . calcium-vitamin D (OSCAL WITH D 500-200) 500-200 MG-UNIT  per tablet Take 1 tablet by mouth daily.    . cetirizine (ZYRTEC) 10 MG chewable tablet Chew 10 mg by mouth daily.   . Coenzyme Q10 (COQ10 PO) Take by mouth daily.  Marland Kitchen EPINEPHrine (EPIPEN IJ) Inject as directed.  . fish oil-omega-3 fatty acids 1000 MG capsule Take 2 g by mouth daily.  Marland Kitchen FLUoxetine (PROZAC) 10 MG capsule TAKE 1 CAPSULE BY MOUTH DAILY.  Marland Kitchen FLUoxetine (PROZAC) 20 MG tablet TAKE 1 TABLET (20 MG TOTAL) BY MOUTH DAILY.  Marland Kitchen ibuprofen (ADVIL,MOTRIN) 200 MG tablet Take 200 mg by mouth as directed.    . multivitamin (THERAGRAN) per tablet Take 1 tablet by mouth daily.    Marland Kitchen neomycin-polymyxin-hydrocortisone (CORTISPORIN) 3.5-10000-1 otic suspension Place 3 drops into both ears 3 (three) times daily as needed.  . polyethylene glycol (MIRALAX) powder Take 17 g by mouth as needed.   . pravastatin (PRAVACHOL) 10 MG tablet TAKE 1 TABLET BY MOUTH DAILY  . tiZANidine (ZANAFLEX) 4 MG tablet TAKE 1 TABLET (4  MG TOTAL) BY MOUTH AT BEDTIME AS NEEDED FOR MUSCLE SPASMS.  . Triamcinolone Acetonide (NASACORT AQ NA) Place into the nose.  . [DISCONTINUED] pravastatin (PRAVACHOL) 10 MG tablet Take 1 tablet (10 mg total) by mouth daily.  . [DISCONTINUED] pravastatin (PRAVACHOL) 10 MG tablet TAKE 1 TABLET BY MOUTH DAILY**NEEDS APPT**   No facility-administered encounter medications on file as of 03/03/2016.    Review of Systems  Constitutional: Negative for appetite change and unexpected weight change.  HENT: Negative for congestion and sinus pressure.   Respiratory: Negative for cough, chest tightness and shortness of breath.   Cardiovascular: Negative for chest pain, palpitations and leg swelling.  Gastrointestinal: Negative for nausea, vomiting, abdominal pain and diarrhea.  Genitourinary: Negative for dysuria and difficulty urinating.  Musculoskeletal: Negative for back pain and joint swelling.  Skin: Negative for color change and rash.  Neurological: Negative for dizziness, light-headedness and headaches.  Psychiatric/Behavioral: Negative for dysphoric mood and agitation.       Objective:    Physical Exam  Constitutional: She appears well-developed and well-nourished. No distress.  HENT:  Nose: Nose normal.  Mouth/Throat: Oropharynx is clear and moist.  Neck: Neck supple. No thyromegaly present.  Cardiovascular: Normal rate and regular rhythm.   Pulmonary/Chest: Breath sounds normal. No respiratory distress. She has no wheezes.  Abdominal: Soft. Bowel sounds are normal. There is no tenderness.  Musculoskeletal: She exhibits no edema or tenderness.  Lymphadenopathy:    She has no cervical adenopathy.  Skin: No rash noted. No erythema.  Psychiatric: She has a normal mood and affect. Her behavior is normal.    BP 138/80 mmHg  Pulse 70  Temp(Src) 98.1 F (36.7 C) (Oral)  Resp 18  Ht 5\' 4"  (1.626 m)  Wt 165 lb (74.844 kg)  BMI 28.31 kg/m2  SpO2 99%  LMP 12/12/1981 Wt Readings from  Last 3 Encounters:  03/03/16 165 lb (74.844 kg)  12/01/15 159 lb (72.122 kg)  10/08/15 164 lb (74.39 kg)     Lab Results  Component Value Date   WBC 5.9 10/13/2015   HGB 13.7 10/13/2015   HCT 41.4 10/13/2015   PLT 324.0 10/13/2015   GLUCOSE 90 02/29/2016   CHOL 210* 02/29/2016   TRIG 111.0 02/29/2016   HDL 56.50 02/29/2016   LDLDIRECT 155.8 06/27/2013   LDLCALC 131* 02/29/2016   ALT 14 02/29/2016   AST 16 02/29/2016   NA 140 02/29/2016   K 3.8 02/29/2016   CL 103 02/29/2016  CREATININE 1.09 02/29/2016   BUN 10 02/29/2016   CO2 29 02/29/2016   TSH 0.37 10/13/2015    Mm Digital Screening Bilateral  10/20/2015  CLINICAL DATA:  Screening. EXAM: DIGITAL SCREENING BILATERAL MAMMOGRAM WITH CAD COMPARISON:  Previous exam(s). ACR Breast Density Category b: There are scattered areas of fibroglandular density. FINDINGS: There are no findings suspicious for malignancy. Images were processed with CAD. IMPRESSION: No mammographic evidence of malignancy. A result letter of this screening mammogram will be mailed directly to the patient. RECOMMENDATION: Screening mammogram in one year. (Code:SM-B-01Y) BI-RADS CATEGORY  1: Negative. Electronically Signed   By: Everlean Alstrom M.D.   On: 10/20/2015 07:46       Assessment & Plan:   Problem List Items Addressed This Visit    Anxiety - Primary    Coping better.  Handling stress better.  On prozac.  Continue.  Follow.        Breast cancer screening    Mammogram 10/20/15 - Birads I.       Elevated blood pressure    Blood pressure as outlined.  Follow.        GERD (gastroesophageal reflux disease)    No upper symptoms reported.  Follow.        Health care maintenance   Hypercholesterolemia    Discussed diet and exercise.  Follow lipid panel.        Relevant Orders   Lipid panel   Comprehensive metabolic panel       [name], MD

## 2016-03-05 ENCOUNTER — Encounter: Payer: Self-pay | Admitting: Internal Medicine

## 2016-03-05 DIAGNOSIS — E7800 Pure hypercholesterolemia, unspecified: Secondary | ICD-10-CM | POA: Insufficient documentation

## 2016-03-05 NOTE — Assessment & Plan Note (Signed)
Coping better.  Handling stress better.  On prozac.  Continue.  Follow.

## 2016-03-05 NOTE — Assessment & Plan Note (Signed)
Blood pressure as outlined.  Follow.   

## 2016-03-05 NOTE — Assessment & Plan Note (Signed)
No upper symptoms reported.  Follow.   

## 2016-03-05 NOTE — Assessment & Plan Note (Signed)
Discussed diet and exercise.  Follow lipid panel.  

## 2016-03-05 NOTE — Assessment & Plan Note (Signed)
Mammogram 10/20/15 - Birads I.

## 2016-03-22 ENCOUNTER — Other Ambulatory Visit: Payer: Self-pay | Admitting: Internal Medicine

## 2016-03-22 NOTE — Telephone Encounter (Signed)
Rx refill sent to pharmacy. 

## 2016-05-06 ENCOUNTER — Other Ambulatory Visit: Payer: Self-pay | Admitting: Internal Medicine

## 2016-06-02 ENCOUNTER — Encounter: Payer: Self-pay | Admitting: Internal Medicine

## 2016-06-02 NOTE — Telephone Encounter (Signed)
Please call pt and see how she is doing.  See her my chart message.  If she is now having headache and worsening symptoms, she needs to be reevaluated.  I also sent her a my chart message.

## 2016-06-03 ENCOUNTER — Ambulatory Visit (INDEPENDENT_AMBULATORY_CARE_PROVIDER_SITE_OTHER): Payer: Managed Care, Other (non HMO) | Admitting: Family Medicine

## 2016-06-03 ENCOUNTER — Encounter: Payer: Self-pay | Admitting: Family Medicine

## 2016-06-03 VITALS — BP 172/96 | HR 81 | Temp 98.4°F | Ht 64.0 in | Wt 174.5 lb

## 2016-06-03 DIAGNOSIS — R21 Rash and other nonspecific skin eruption: Secondary | ICD-10-CM | POA: Insufficient documentation

## 2016-06-03 MED ORDER — DOXYCYCLINE HYCLATE 100 MG PO TABS: 100.0000 mg | ORAL_TABLET | Freq: Two times a day (BID) | ORAL | Status: DC

## 2016-06-03 NOTE — Assessment & Plan Note (Signed)
New problem. Patient with papular rash and associated systemic symptoms. Other than the rash, exam is unremarkable. Patient concerned about "tick fever". Etiology unclear at this time. Empirically treating with doxycycline. Patient to follow-up closely with PCP.

## 2016-06-03 NOTE — Patient Instructions (Signed)
Take the doxycycline as prescribed.  Stop the Augmentin.  Follow up closely with Dr. Nicki Reaper  Dr. Lacinda Axon

## 2016-06-03 NOTE — Telephone Encounter (Signed)
Please call pt and let her know that with headache and rash, needs to be seen.  Thanks

## 2016-06-03 NOTE — Progress Notes (Signed)
Pre visit review using our clinic review tool, if applicable. No additional management support is needed unless otherwise documented below in the visit note. 

## 2016-06-03 NOTE — Telephone Encounter (Signed)
Patient has appointment today @1615  with Dr. Lacinda Axon for Jill Torres.

## 2016-06-03 NOTE — Progress Notes (Signed)
Subjective:  Patient ID: Jill Torres, female    DOB: 18-Jun-1951  Age: 65 y.o. MRN: [id number]  CC: Not feeling well, ST, Headache, chills, sweating, rash  HPI:  65 year old female presents with the above complaints.  Patient states that she's not been feeling well since Monday. She reports that she has been experiencing headache, chills, sore throat, sweating, and rash. The rash is located on her lower legs. She was seen at [facility] on Monday. She was diagnosed with pharyngitis and was treated empirically with Augmentin. Patient states that since her visit she's felt no improvement. She continues to have the above symptoms and feels poorly. She does not recall recent tick bite although she thinks that she may have had one recently. No known exacerbating factors. No other complaints at this time.   Social Hx   Social History   Social History  . Marital Status: Single    Spouse Name: N/A  . Number of Children: 2  . Years of Education: N/A   Occupational History  . Sales     [employer]    Social History Main Topics  . Smoking status: Never Smoker   . Smokeless tobacco: Never Used  . Alcohol Use: No  . Drug Use: No  . Sexual Activity: Not Asked   Other Topics Concern  . None   Social History Narrative   No regular exercise   Lives in noisy environment- very stressful    Daily caffeine use    Review of Systems  Constitutional: Positive for chills and fatigue. Negative for fever.  HENT: Positive for sore throat.   Musculoskeletal: Positive for neck pain.  Skin: Positive for rash.  Neurological: Positive for headaches.   Objective:  BP 172/96 mmHg  Pulse 81  Temp(Src) 98.4 F (36.9 C) (Oral)  Ht 5\' 4"  (1.626 m)  Wt 174 lb 8 oz (79.153 kg)  BMI 29.94 kg/m2  SpO2 98%  LMP 12/12/1981  BP/Weight 06/03/2016 03/03/2016 [id number]  Systolic BP [id number]  Diastolic BP 96 80 80  Wt. (Lbs) 174.5 165 159  BMI 29.94 28.31 27.28   Physical  Exam  Constitutional: She is oriented to person, place, and time. She appears well-developed. No distress.  HENT:  Head: Normocephalic and atraumatic.  Oropharynx with erythema. No exudate. Normal TMs bilaterally.   Pulmonary/Chest: Effort normal. She has no wheezes. She has no rales.  Neurological: She is alert and oriented to person, place, and time.  Skin:  Bilateral lower extremities - diffuse palpable, red papules noted. Some areas appear like petechiae and others not.  Psychiatric: She has a normal mood and affect.  Vitals reviewed.  Lab Results  Component Value Date   WBC 5.9 10/13/2015   HGB 13.7 10/13/2015   HCT 41.4 10/13/2015   PLT 324.0 10/13/2015   GLUCOSE 90 02/29/2016   CHOL 210* 02/29/2016   TRIG 111.0 02/29/2016   HDL 56.50 02/29/2016   LDLDIRECT 155.8 06/27/2013   LDLCALC 131* 02/29/2016   ALT 14 02/29/2016   AST 16 02/29/2016   NA 140 02/29/2016   K 3.8 02/29/2016   CL 103 02/29/2016   CREATININE 1.09 02/29/2016   BUN 10 02/29/2016   CO2 29 02/29/2016   TSH 0.37 10/13/2015   Assessment & Plan:   Problem List Items Addressed This Visit    Rash - Primary    New problem. Patient with papular rash and associated systemic symptoms. Other than the rash, exam is unremarkable. Patient  concerned about "tick fever". Etiology unclear at this time. Empirically treating with doxycycline. Patient to follow-up closely with PCP.         Meds ordered this encounter  Medications  . doxycycline (VIBRA-TABS) 100 MG tablet    Sig: Take 1 tablet (100 mg total) by mouth 2 (two) times daily.    Dispense:  20 tablet    Refill:  0    Follow-up: PRN  [place]

## 2016-06-06 ENCOUNTER — Encounter: Payer: Self-pay | Admitting: Internal Medicine

## 2016-06-14 ENCOUNTER — Other Ambulatory Visit: Payer: Self-pay | Admitting: Internal Medicine

## 2016-06-15 NOTE — Telephone Encounter (Signed)
Please confirm with pt she is taking both daily.  If so, ok to refill x 3

## 2016-06-30 ENCOUNTER — Other Ambulatory Visit: Payer: Managed Care, Other (non HMO)

## 2016-07-04 ENCOUNTER — Ambulatory Visit: Payer: Managed Care, Other (non HMO) | Admitting: Internal Medicine

## 2016-07-15 ENCOUNTER — Other Ambulatory Visit (INDEPENDENT_AMBULATORY_CARE_PROVIDER_SITE_OTHER): Payer: Managed Care, Other (non HMO)

## 2016-07-15 ENCOUNTER — Encounter: Payer: Self-pay | Admitting: Internal Medicine

## 2016-07-15 DIAGNOSIS — E7800 Pure hypercholesterolemia, unspecified: Secondary | ICD-10-CM

## 2016-07-15 LAB — COMPREHENSIVE METABOLIC PANEL
ALK PHOS: 62 U/L (ref 39–117)
ALT: 19 U/L (ref 0–35)
AST: 19 U/L (ref 0–37)
Albumin: 4 g/dL (ref 3.5–5.2)
BUN: 19 mg/dL (ref 6–23)
CO2: 28 mEq/L (ref 19–32)
Calcium: 9.9 mg/dL (ref 8.4–10.5)
Chloride: 104 mEq/L (ref 96–112)
Creatinine, Ser: 1.07 mg/dL (ref 0.40–1.20)
GFR: 54.87 mL/min — AB (ref >60.00)
GLUCOSE: 92 mg/dL (ref 70–99)
POTASSIUM: 3.9 meq/L (ref 3.5–5.1)
Sodium: 139 mEq/L (ref 135–145)
TOTAL PROTEIN: 7 g/dL (ref 6.0–8.3)
Total Bilirubin: 0.6 mg/dL (ref 0.2–1.2)

## 2016-07-15 LAB — LIPID PANEL
Cholesterol: 197 mg/dL (ref 0–200)
HDL: 64.7 mg/dL
LDL Cholesterol: 112 mg/dL — ABNORMAL HIGH (ref 0–99)
NonHDL: 132.44
Total CHOL/HDL Ratio: 3
Triglycerides: 104 mg/dL (ref 0.0–149.0)
VLDL: 20.8 mg/dL (ref 0.0–40.0)

## 2016-07-18 ENCOUNTER — Ambulatory Visit (INDEPENDENT_AMBULATORY_CARE_PROVIDER_SITE_OTHER): Payer: Managed Care, Other (non HMO) | Admitting: Internal Medicine

## 2016-07-18 ENCOUNTER — Encounter: Payer: Self-pay | Admitting: Internal Medicine

## 2016-07-18 DIAGNOSIS — E7800 Pure hypercholesterolemia, unspecified: Secondary | ICD-10-CM

## 2016-07-18 DIAGNOSIS — F419 Anxiety disorder, unspecified: Secondary | ICD-10-CM

## 2016-07-18 NOTE — Progress Notes (Signed)
Patient ID: NICKCOLE BLASBERG, female   DOB: [birth year], 65 y.o.   MRN: [id number]   Subjective:    Patient ID: [name], female    DOB: 08-03-1951, 65 y.o.   MRN: [id number]  HPI  Patient here for a scheduled follow up.  States she is doing well.  Feels better.  Stays active.  No chest pain.  No sob.  No acid reflux.  No abdominal pain or cramping.  Bowels stable.  Handling stress.  Off prozac and doing well.     Past Medical History:  Diagnosis Date  . Allergy   . Arthritis   . Depression   . Diverticulosis of colon (without mention of hemorrhage)   . GERD (gastroesophageal reflux disease)   . History of chicken pox   . History of shingles may 2013  . Irritable bowel syndrome   . Unspecified hypothyroidism    Past Surgical History:  Procedure Laterality Date  . ABDOMINAL HYSTERECTOMY  1983   due to heavy bleeding & ovary cyst  . APPENDECTOMY  1974  . BLADDER DIVERTICULECTOMY  1990  . CESAREAN SECTION     x 2  . CHOLECYSTECTOMY     x 2  . DILATION AND CURETTAGE OF UTERUS     x 2  . DIRECT LARYNGOSCOPY Right 06/11/2015   Procedure: suspension microdirect laryngoscopy with biopsy of right tongue base;  Surgeon: Carloyn Manner, MD;  Location: [facility] ORS;  Service: ENT;  Laterality: Right;  . NASAL SEPTUM SURGERY  1999  . PARTIAL HYSTERECTOMY  1983   cyst on ovary and heavy bleeding    Family History  Problem Relation Age of Onset  . Arthritis Mother   . Hypertension Mother   . Hyperlipidemia Mother   . Atrial fibrillation Mother   . Heart disease Mother   . Diabetes Mother   . Hyperlipidemia Father   . Heart disease Father     s/p CABG  . Stroke Father   . Rheumatic fever Sister     s/p valve replacement  . Deep vein thrombosis Brother     after immobilization   . Breast cancer Neg Hx    Social History   Social History  . Marital status: Single    Spouse name: N/A  . Number of children: 2  . Years of education: N/A   Occupational History  . Sales       [employer]    Social History Main Topics  . Smoking status: Never Smoker  . Smokeless tobacco: Never Used  . Alcohol use No  . Drug use: No  . Sexual activity: Not Asked   Other Topics Concern  . None   Social History Narrative   No regular exercise   Lives in noisy environment- very stressful    Daily caffeine use     Outpatient Encounter Prescriptions as of 07/18/2016  Medication Sig  . ALPRAZolam (XANAX) 0.25 MG tablet TAKE 1 TABLET BY MOUTH DAILY AS NEEDED FOR ANXIETY  . bifidobacterium infantis (ALIGN) capsule Take 1 capsule by mouth daily.    . calcium-vitamin D (OSCAL WITH D 500-200) 500-200 MG-UNIT per tablet Take 1 tablet by mouth daily.    . cetirizine (ZYRTEC) 10 MG chewable tablet Chew 10 mg by mouth daily.   . Coenzyme Q10 (COQ10 PO) Take by mouth daily.  . fish oil-omega-3 fatty acids 1000 MG capsule Take 2 g by mouth daily.  Marland Kitchen FLUoxetine (PROZAC) 10 MG capsule TAKE 1 CAPSULE BY  MOUTH DAILY.  Marland Kitchen ibuprofen (ADVIL,MOTRIN) 200 MG tablet Take 200 mg by mouth as directed.    . multivitamin (THERAGRAN) per tablet Take 1 tablet by mouth daily.    . polyethylene glycol (MIRALAX) powder Take 17 g by mouth as needed.   . pravastatin (PRAVACHOL) 10 MG tablet TAKE 1 TABLET BY MOUTH DAILY  . tiZANidine (ZANAFLEX) 4 MG tablet TAKE 1 TABLET (4 MG TOTAL) BY MOUTH AT BEDTIME AS NEEDED FOR MUSCLE SPASMS.  Marland Kitchen doxycycline (VIBRA-TABS) 100 MG tablet Take 1 tablet (100 mg total) by mouth 2 (two) times daily.  Marland Kitchen EPINEPHrine (EPIPEN IJ) Inject as directed.  . neomycin-polymyxin-hydrocortisone (CORTISPORIN) 3.5-10000-1 otic suspension Place 3 drops into both ears 3 (three) times daily as needed. (Patient not taking: Reported on 07/18/2016)  . Triamcinolone Acetonide (NASACORT AQ NA) Place into the nose.  . [DISCONTINUED] amoxicillin-clavulanate (AUGMENTIN) 875-125 MG tablet Take 1 tablet by mouth every 12 (twelve) hours. Reported on 06/03/2016  . [DISCONTINUED]  Azelastine-Fluticasone (DYMISTA) 137-50 MCG/ACT SUSP Place into the nose.  . [DISCONTINUED] FLUoxetine (PROZAC) 20 MG tablet TAKE 1 TABLET (20 MG TOTAL) BY MOUTH DAILY.   No facility-administered encounter medications on file as of 07/18/2016.     Review of Systems  Constitutional: Negative for appetite change and unexpected weight change.  HENT: Negative for congestion and sinus pressure.   Respiratory: Negative for cough, chest tightness and shortness of breath.   Cardiovascular: Negative for chest pain, palpitations and leg swelling.  Gastrointestinal: Negative for abdominal pain, diarrhea, nausea and vomiting.  Musculoskeletal: Negative for back pain and joint swelling.  Skin: Negative for color change and rash.  Neurological: Negative for dizziness, light-headedness and headaches.  Psychiatric/Behavioral: Negative for agitation and dysphoric mood.       Objective:    Physical Exam  Constitutional: She appears well-developed and well-nourished. She appears distressed.  HENT:  Nose: Nose normal.  Mouth/Throat: Oropharynx is clear and moist.  Neck: Neck supple. No thyromegaly present.  Cardiovascular: Normal rate and regular rhythm.   Pulmonary/Chest: Breath sounds normal. No respiratory distress. She has no wheezes.  Abdominal: Soft. Bowel sounds are normal. There is no tenderness.  Musculoskeletal: She exhibits no edema or tenderness.  Lymphadenopathy:    She has no cervical adenopathy.  Skin: No rash noted. No erythema.  Psychiatric: She has a normal mood and affect. Her behavior is normal.    BP 138/82   Pulse 72   Temp 98.3 F (36.8 C)   Resp 14   Wt 177 lb (80.3 kg)   LMP 12/12/1981   BMI 30.38 kg/m  Wt Readings from Last 3 Encounters:  07/18/16 177 lb (80.3 kg)  06/03/16 174 lb 8 oz (79.2 kg)  03/03/16 165 lb (74.8 kg)     Lab Results  Component Value Date   WBC 5.9 10/13/2015   HGB 13.7 10/13/2015   HCT 41.4 10/13/2015   PLT 324.0 10/13/2015    GLUCOSE 92 07/15/2016   CHOL 197 07/15/2016   TRIG 104.0 07/15/2016   HDL 64.70 07/15/2016   LDLDIRECT 155.8 06/27/2013   LDLCALC 112 (H) 07/15/2016   ALT 19 07/15/2016   AST 19 07/15/2016   NA 139 07/15/2016   K 3.9 07/15/2016   CL 104 07/15/2016   CREATININE 1.07 07/15/2016   BUN 19 07/15/2016   CO2 28 07/15/2016   TSH 0.37 10/13/2015    Mm Digital Screening Bilateral  Result Date: 10/20/2015 CLINICAL DATA:  Screening. EXAM: DIGITAL SCREENING BILATERAL MAMMOGRAM WITH CAD COMPARISON:  Previous exam(s). ACR Breast Density Category b: There are scattered areas of fibroglandular density. FINDINGS: There are no findings suspicious for malignancy. Images were processed with CAD. IMPRESSION: No mammographic evidence of malignancy. A result letter of this screening mammogram will be mailed directly to the patient. RECOMMENDATION: Screening mammogram in one year. (Code:SM-B-01Y) BI-RADS CATEGORY  1: Negative. Electronically Signed   By: Everlean Alstrom M.D.   On: 10/20/2015 07:46       Assessment & Plan:   Problem List Items Addressed This Visit    Anxiety    Off prozac.  Doing well.  Follow.       Hypercholesterolemia    Cholesterol improved.  On pravastatin.  Low cholesterol diet and exercise.  Follow lipid panel and liver function tests.        Relevant Orders   CBC with Differential/Platelet   TSH   Lipid panel   Hepatic function panel   Basic metabolic panel    Other Visit Diagnoses   None.      [name], MD

## 2016-07-19 ENCOUNTER — Encounter: Payer: Self-pay | Admitting: Internal Medicine

## 2016-07-19 NOTE — Assessment & Plan Note (Signed)
Off prozac.  Doing well.  Follow.   

## 2016-07-19 NOTE — Assessment & Plan Note (Signed)
Cholesterol improved.  On pravastatin.  Low cholesterol diet and exercise.  Follow lipid panel and liver function tests.

## 2016-07-29 ENCOUNTER — Other Ambulatory Visit: Payer: Self-pay | Admitting: Internal Medicine

## 2016-08-29 ENCOUNTER — Other Ambulatory Visit: Payer: Self-pay

## 2016-08-29 MED ORDER — PRAVASTATIN SODIUM 10 MG PO TABS: 10.0000 mg | ORAL_TABLET | Freq: Every day | ORAL | 2 refills | Status: DC

## 2016-09-20 ENCOUNTER — Other Ambulatory Visit: Payer: Self-pay | Admitting: Internal Medicine

## 2016-09-20 DIAGNOSIS — Z1231 Encounter for screening mammogram for malignant neoplasm of breast: Secondary | ICD-10-CM

## 2016-10-21 ENCOUNTER — Ambulatory Visit
Admission: RE | Admit: 2016-10-21 | Discharge: 2016-10-21 | Disposition: A | Discharge status: Home / Self Care | Payer: Managed Care, Other (non HMO) | Source: Ambulatory Visit | Attending: Internal Medicine | Admitting: Internal Medicine

## 2016-10-21 DIAGNOSIS — Z1231 Encounter for screening mammogram for malignant neoplasm of breast: Secondary | ICD-10-CM

## 2016-11-21 ENCOUNTER — Other Ambulatory Visit (INDEPENDENT_AMBULATORY_CARE_PROVIDER_SITE_OTHER): Payer: Managed Care, Other (non HMO)

## 2016-11-21 DIAGNOSIS — E7800 Pure hypercholesterolemia, unspecified: Secondary | ICD-10-CM

## 2016-11-21 LAB — CBC WITH DIFFERENTIAL/PLATELET
BASOS PCT: 0.8 % (ref 0.0–3.0)
Basophils Absolute: 0.1 10*3/uL (ref 0.0–0.1)
Eosinophils Absolute: 0.1 10*3/uL (ref 0.0–0.7)
Eosinophils Relative: 1.5 % (ref 0.0–5.0)
HCT: 40.9 % (ref 36.0–46.0)
HEMOGLOBIN: 13.8 g/dL (ref 12.0–15.0)
LYMPHS ABS: 2.4 10*3/uL (ref 0.7–4.0)
Lymphocytes Relative: 31.7 % (ref 12.0–46.0)
MCHC: 33.8 g/dL (ref 30.0–36.0)
MCV: 88.2 fl (ref 78.0–100.0)
MONO ABS: 0.6 10*3/uL (ref 0.1–1.0)
Monocytes Relative: 7.5 % (ref 3.0–12.0)
NEUTROS ABS: 4.5 10*3/uL (ref 1.4–7.7)
NEUTROS PCT: 58.5 % (ref 43.0–77.0)
PLATELETS: 349 10*3/uL (ref 150.0–400.0)
RBC: 4.64 Mil/uL (ref 3.87–5.11)
RDW: 13.2 % (ref 11.5–15.5)
WBC: 7.7 10*3/uL (ref 4.0–10.5)

## 2016-11-21 LAB — HEPATIC FUNCTION PANEL
ALT: 14 U/L (ref 0–35)
AST: 13 U/L (ref 0–37)
Albumin: 4.2 g/dL (ref 3.5–5.2)
Alkaline Phosphatase: 62 U/L (ref 39–117)
BILIRUBIN TOTAL: 0.7 mg/dL (ref 0.2–1.2)
Bilirubin, Direct: 0.1 mg/dL (ref 0.0–0.3)
Total Protein: 6.9 g/dL (ref 6.0–8.3)

## 2016-11-21 LAB — LIPID PANEL
Cholesterol: 215 mg/dL — ABNORMAL HIGH (ref 0–200)
HDL: 58.9 mg/dL (ref >39.00)
LDL Cholesterol: 128 mg/dL — ABNORMAL HIGH (ref 0–99)
NONHDL: 156.57
Total CHOL/HDL Ratio: 4
Triglycerides: 145 mg/dL (ref 0.0–149.0)
VLDL: 29 mg/dL (ref 0.0–40.0)

## 2016-11-21 LAB — BASIC METABOLIC PANEL
BUN: 11 mg/dL (ref 6–23)
CALCIUM: 9.2 mg/dL (ref 8.4–10.5)
CO2: 27 meq/L (ref 19–32)
Chloride: 106 mEq/L (ref 96–112)
Creatinine, Ser: 1.01 mg/dL (ref 0.40–1.20)
GFR: 58.59 mL/min — ABNORMAL LOW (ref >60.00)
GLUCOSE: 85 mg/dL (ref 70–99)
Potassium: 4.2 mEq/L (ref 3.5–5.1)
Sodium: 141 mEq/L (ref 135–145)

## 2016-11-21 LAB — TSH: TSH: 0.74 u[IU]/mL (ref 0.35–4.50)

## 2016-11-22 ENCOUNTER — Encounter: Payer: Self-pay | Admitting: Internal Medicine

## 2016-11-24 ENCOUNTER — Ambulatory Visit (INDEPENDENT_AMBULATORY_CARE_PROVIDER_SITE_OTHER): Payer: Managed Care, Other (non HMO) | Admitting: Internal Medicine

## 2016-11-24 ENCOUNTER — Encounter: Payer: Self-pay | Admitting: Internal Medicine

## 2016-11-24 VITALS — BP 144/82 | HR 88 | Ht 64.0 in | Wt 185.0 lb

## 2016-11-24 DIAGNOSIS — E7800 Pure hypercholesterolemia, unspecified: Secondary | ICD-10-CM

## 2016-11-24 DIAGNOSIS — K589 Irritable bowel syndrome without diarrhea: Secondary | ICD-10-CM | POA: Diagnosis not present

## 2016-11-24 DIAGNOSIS — Z0000 Encounter for general adult medical examination without abnormal findings: Secondary | ICD-10-CM | POA: Diagnosis not present

## 2016-11-24 DIAGNOSIS — F419 Anxiety disorder, unspecified: Secondary | ICD-10-CM

## 2016-11-24 DIAGNOSIS — R3915 Urgency of urination: Secondary | ICD-10-CM | POA: Diagnosis not present

## 2016-11-24 DIAGNOSIS — K219 Gastro-esophageal reflux disease without esophagitis: Secondary | ICD-10-CM

## 2016-11-24 DIAGNOSIS — R030 Elevated blood-pressure reading, without diagnosis of hypertension: Secondary | ICD-10-CM

## 2016-11-24 LAB — URINALYSIS, ROUTINE W REFLEX MICROSCOPIC
BILIRUBIN URINE: NEGATIVE
Ketones, ur: NEGATIVE
LEUKOCYTES UA: NEGATIVE
NITRITE: NEGATIVE
Specific Gravity, Urine: <=1.005 — AB (ref 1.000–1.030)
Total Protein, Urine: NEGATIVE
URINE GLUCOSE: NEGATIVE
UROBILINOGEN UA: 0.2 (ref 0.0–1.0)
pH: 5.5 (ref 5.0–8.0)

## 2016-11-24 NOTE — Progress Notes (Signed)
Patient ID: Jill Torres, female   DOB: February 25, 1951, 65 y.o.   MRN: [id number]   Subjective:    Patient ID: Jill Torres, female    DOB: Dec 03, 1951, 65 y.o.   MRN: [id number]  HPI  Patient here for a physical exam.  States she is doing relatively well.  Trying to stay active.  Discussed diet and exercise.  No chest pain.  No sob.  No acid reflux.  No abdominal pain or cramping.  Bowels stable.  Had last colonoscopy 2010.  States she was told f/u in 10 years.     Past Medical History:  Diagnosis Date  . Allergy   . Arthritis   . Depression   . Diverticulosis of colon (without mention of hemorrhage)   . GERD (gastroesophageal reflux disease)   . History of chicken pox   . History of shingles may 2013  . Irritable bowel syndrome   . Unspecified hypothyroidism    Past Surgical History:  Procedure Laterality Date  . ABDOMINAL HYSTERECTOMY  1983   due to heavy bleeding & ovary cyst  . APPENDECTOMY  1974  . BLADDER DIVERTICULECTOMY  1990  . CESAREAN SECTION     x 2  . CHOLECYSTECTOMY     x 2  . DILATION AND CURETTAGE OF UTERUS     x 2  . DIRECT LARYNGOSCOPY Right 06/11/2015   Procedure: suspension microdirect laryngoscopy with biopsy of right tongue base;  Surgeon: Carloyn Manner, MD;  Location: [facility] ORS;  Service: ENT;  Laterality: Right;  . NASAL SEPTUM SURGERY  1999  . PARTIAL HYSTERECTOMY  1983   cyst on ovary and heavy bleeding    Family History  Problem Relation Age of Onset  . Arthritis Mother   . Hypertension Mother   . Hyperlipidemia Mother   . Atrial fibrillation Mother   . Heart disease Mother   . Diabetes Mother   . Hyperlipidemia Father   . Heart disease Father     s/p CABG  . Stroke Father   . Rheumatic fever Sister     s/p valve replacement  . Deep vein thrombosis Brother     after immobilization   . Breast cancer Neg Hx    Social History   Social History  . Marital status: Single    Spouse name: N/A  . Number of children: 2  . Years of  education: N/A   Occupational History  . Sales     [employer]    Social History Main Topics  . Smoking status: Never Smoker  . Smokeless tobacco: Never Used  . Alcohol use No  . Drug use: No  . Sexual activity: Not Asked   Other Topics Concern  . None   Social History Narrative   No regular exercise   Lives in noisy environment- very stressful    Daily caffeine use     Outpatient Encounter Prescriptions as of 11/24/2016  Medication Sig  . bifidobacterium infantis (ALIGN) capsule Take 1 capsule by mouth daily.    . calcium-vitamin D (OSCAL WITH D 500-200) 500-200 MG-UNIT per tablet Take 1 tablet by mouth daily.    . cetirizine (ZYRTEC) 10 MG chewable tablet Chew 10 mg by mouth daily.   . Coenzyme Q10 (COQ10 PO) Take by mouth daily.  . fish oil-omega-3 fatty acids 1000 MG capsule Take 2 g by mouth daily.  Marland Kitchen ibuprofen (ADVIL,MOTRIN) 200 MG tablet Take 200 mg by mouth as directed.    . multivitamin (THERAGRAN)  per tablet Take 1 tablet by mouth daily.    Marland Kitchen neomycin-polymyxin-hydrocortisone (CORTISPORIN) 3.5-10000-1 otic suspension Place 3 drops into both ears 3 (three) times daily as needed.  . polyethylene glycol (MIRALAX) powder Take 17 g by mouth as needed.   . pravastatin (PRAVACHOL) 10 MG tablet Take 1 tablet (10 mg total) by mouth daily.  . Triamcinolone Acetonide (NASACORT AQ NA) Place into the nose.  . [DISCONTINUED] ALPRAZolam (XANAX) 0.25 MG tablet TAKE 1 TABLET BY MOUTH DAILY AS NEEDED FOR ANXIETY  . [DISCONTINUED] doxycycline (VIBRA-TABS) 100 MG tablet Take 1 tablet (100 mg total) by mouth 2 (two) times daily.  . [DISCONTINUED] EPINEPHrine (EPIPEN IJ) Inject as directed.  . [DISCONTINUED] FLUoxetine (PROZAC) 10 MG capsule TAKE 1 CAPSULE BY MOUTH DAILY.  . [DISCONTINUED] tiZANidine (ZANAFLEX) 4 MG tablet TAKE 1 TABLET (4 MG TOTAL) BY MOUTH AT BEDTIME AS NEEDED FOR MUSCLE SPASMS.   No facility-administered encounter medications on file as of 11/24/2016.      Review of Systems  Constitutional: Negative for appetite change and unexpected weight change.  HENT: Negative for congestion and sinus pressure.   Eyes: Negative for pain and visual disturbance.  Respiratory: Negative for cough, chest tightness and shortness of breath.   Cardiovascular: Negative for chest pain, palpitations and leg swelling.  Gastrointestinal: Negative for abdominal pain, diarrhea, nausea and vomiting.  Genitourinary: Negative for difficulty urinating and dysuria.  Musculoskeletal: Negative for back pain and joint swelling.  Skin: Negative for color change and rash.  Neurological: Negative for dizziness, light-headedness and headaches.  Hematological: Negative for adenopathy. Does not bruise/bleed easily.  Psychiatric/Behavioral: Negative for agitation and dysphoric mood.       Objective:    Physical Exam  Constitutional: She is oriented to person, place, and time. She appears well-developed and well-nourished. No distress.  HENT:  Nose: Nose normal.  Mouth/Throat: Oropharynx is clear and moist.  Eyes: Right eye exhibits no discharge. Left eye exhibits no discharge. No scleral icterus.  Neck: Neck supple. No thyromegaly present.  Cardiovascular: Normal rate and regular rhythm.   Pulmonary/Chest: Breath sounds normal. No accessory muscle usage. No tachypnea. No respiratory distress. She has no decreased breath sounds. She has no wheezes. She has no rhonchi. Right breast exhibits no inverted nipple, no mass, no nipple discharge and no tenderness (no axillary adenopathy). Left breast exhibits no inverted nipple, no mass, no nipple discharge and no tenderness (no axilarry adenopathy).  Abdominal: Soft. Bowel sounds are normal. There is no tenderness.  Musculoskeletal: She exhibits no edema or tenderness.  Lymphadenopathy:    She has no cervical adenopathy.  Neurological: She is alert and oriented to person, place, and time.  Skin: Skin is warm. No rash noted. No  erythema.  Psychiatric: She has a normal mood and affect. Her behavior is normal.    BP (!) 144/82   Pulse 88   Ht 5\' 4"  (1.626 m)   Wt 185 lb (83.9 kg)   LMP 12/12/1981   SpO2 99%   BMI 31.76 kg/m  Wt Readings from Last 3 Encounters:  11/24/16 185 lb (83.9 kg)  07/18/16 177 lb (80.3 kg)  06/03/16 174 lb 8 oz (79.2 kg)     Lab Results  Component Value Date   WBC 7.7 11/21/2016   HGB 13.8 11/21/2016   HCT 40.9 11/21/2016   PLT 349.0 11/21/2016   GLUCOSE 85 11/21/2016   CHOL 215 (H) 11/21/2016   TRIG 145.0 11/21/2016   HDL 58.90 11/21/2016   LDLDIRECT 155.8  06/27/2013   LDLCALC 128 (H) 11/21/2016   ALT 14 11/21/2016   AST 13 11/21/2016   NA 141 11/21/2016   K 4.2 11/21/2016   CL 106 11/21/2016   CREATININE 1.01 11/21/2016   BUN 11 11/21/2016   CO2 27 11/21/2016   TSH 0.74 11/21/2016    Mm Screening Breast Tomo Bilateral  Result Date: 10/21/2016 CLINICAL DATA:  Screening. EXAM: 2D DIGITAL SCREENING BILATERAL MAMMOGRAM WITH CAD AND ADJUNCT TOMO COMPARISON:  Previous exam(s). ACR Breast Density Category c: The breast tissue is heterogeneously dense, which may obscure small masses. FINDINGS: There are no findings suspicious for malignancy. Images were processed with CAD. IMPRESSION: No mammographic evidence of malignancy. A result letter of this screening mammogram will be mailed directly to the patient. RECOMMENDATION: Screening mammogram in one year. (Code:SM-B-01Y) BI-RADS CATEGORY  1: Negative. Electronically Signed   By: Everlean Alstrom M.D.   On: 10/21/2016 13:34       Assessment & Plan:   Problem List Items Addressed This Visit    Anxiety    Off prozac.  Doing well.  Follow.        Elevated blood pressure reading    Blood pressure on recheck improved.  Have her spot check her pressure.        GERD (gastroesophageal reflux disease)    Not a significant issue for her.  Will notify me if problems.        Health care maintenance    Physical today  11/24/16.  Mammogram 10/21/16 - Birads I.  Colonoscopy 02/27/09 - hyperplastic polyp.  She reports was told f/u 10 years.       Hypercholesterolemia    Cholesterol slightly increased from the previous check.  Low cholesterol diet and exercise.  Follow lipid panel.    Lab Results  Component Value Date   CHOL 215 (H) 11/21/2016   HDL 58.90 11/21/2016   LDLCALC 128 (H) 11/21/2016   LDLDIRECT 155.8 06/27/2013   TRIG 145.0 11/21/2016   CHOLHDL 4 11/21/2016        Relevant Orders   Hepatic function panel   Lipid panel   Basic metabolic panel   IRRITABLE BOWEL SYNDROME    Bowels stable.         Other Visit Diagnoses    Routine general medical examination at a health care facility    -  Primary   Urinary urgency       Relevant Orders   Urinalysis, Routine w reflex microscopic (Completed)   CULTURE, URINE COMPREHENSIVE (Completed)       [name], MD

## 2016-11-24 NOTE — Assessment & Plan Note (Addendum)
Physical today 11/24/16.  Mammogram 10/21/16 - Birads I.  Colonoscopy 02/27/09 - hyperplastic polyp.  She reports was told f/u 10 years.

## 2016-11-24 NOTE — Assessment & Plan Note (Addendum)
Cholesterol slightly increased from the previous check.  Low cholesterol diet and exercise.  Follow lipid panel.    Lab Results  Component Value Date   CHOL 215 (H) 11/21/2016   HDL 58.90 11/21/2016   LDLCALC 128 (H) 11/21/2016   LDLDIRECT 155.8 06/27/2013   TRIG 145.0 11/21/2016   CHOLHDL 4 11/21/2016

## 2016-11-24 NOTE — Assessment & Plan Note (Signed)
Bowels stable.  

## 2016-11-24 NOTE — Assessment & Plan Note (Signed)
Not a significant issue for her.  Will notify me if problems.

## 2016-11-25 ENCOUNTER — Encounter: Payer: Self-pay | Admitting: Internal Medicine

## 2016-11-26 LAB — CULTURE, URINE COMPREHENSIVE: Organism ID, Bacteria: NO GROWTH

## 2016-11-27 ENCOUNTER — Encounter: Payer: Self-pay | Admitting: Internal Medicine

## 2016-11-27 NOTE — Assessment & Plan Note (Signed)
Blood pressure on recheck improved.  Have her spot check her pressure.

## 2016-11-27 NOTE — Assessment & Plan Note (Signed)
Off prozac.  Doing well.  Follow.   

## 2016-11-28 ENCOUNTER — Encounter: Payer: Self-pay | Admitting: Internal Medicine

## 2016-12-17 ENCOUNTER — Other Ambulatory Visit: Payer: Self-pay | Admitting: Internal Medicine

## 2017-02-13 ENCOUNTER — Ambulatory Visit (INDEPENDENT_AMBULATORY_CARE_PROVIDER_SITE_OTHER): Payer: Managed Care, Other (non HMO) | Admitting: Family

## 2017-02-13 ENCOUNTER — Encounter: Payer: Self-pay | Admitting: Family

## 2017-02-13 ENCOUNTER — Encounter: Payer: Self-pay | Admitting: Internal Medicine

## 2017-02-13 VITALS — BP 160/80 | HR 59 | Temp 98.1°F | Wt 188.6 lb

## 2017-02-13 DIAGNOSIS — R030 Elevated blood-pressure reading, without diagnosis of hypertension: Secondary | ICD-10-CM | POA: Diagnosis not present

## 2017-02-13 DIAGNOSIS — F419 Anxiety disorder, unspecified: Secondary | ICD-10-CM

## 2017-02-13 MED ORDER — AMLODIPINE BESYLATE 2.5 MG PO TABS: 2.5000 mg | ORAL_TABLET | Freq: Every day | ORAL | 0 refills | Status: DC

## 2017-02-13 MED ORDER — ALPRAZOLAM 0.25 MG PO TABS: 0.2500 mg | ORAL_TABLET | Freq: Every day | ORAL | 0 refills | Status: DC | PRN

## 2017-02-13 MED ORDER — FLUOXETINE HCL 10 MG PO CAPS: 10.0000 mg | ORAL_CAPSULE | Freq: Every day | ORAL | 1 refills | Status: DC

## 2017-02-13 NOTE — Patient Instructions (Addendum)
Bring BP cuff next week,  Goal < 140/90.  Keep BP Log daily for one week.   Start low dose amlodipine-- and monitor BP to ensure not too low.  Limit salt  If BP < 120/80; HOLD amlodipine.   Follow up one week   Managing Your Hypertension Hypertension is commonly called high blood pressure. This is when the force of your blood pressing against the walls of your arteries is too strong. Arteries are blood vessels that carry blood from your heart throughout your body. Hypertension forces the heart to work harder to pump blood, and may cause the arteries to become narrow or stiff. Having untreated or uncontrolled hypertension can cause heart attack, stroke, kidney disease, and other problems. What are blood pressure readings? A blood pressure reading consists of a higher number over a lower number. Ideally, your blood pressure should be below 120/80. The first ("top") number is called the systolic pressure. It is a measure of the pressure in your arteries as your heart beats. The second ("bottom") number is called the diastolic pressure. It is a measure of the pressure in your arteries as the heart relaxes. What does my blood pressure reading mean? Blood pressure is classified into four stages. Based on your blood pressure reading, your health care provider may use the following stages to determine what type of treatment you need, if any. Systolic pressure and diastolic pressure are measured in a unit called mm Hg. Normal   Systolic pressure: below 123456.  Diastolic pressure: below 80. Elevated   Systolic pressure: [id number].  Diastolic pressure: below 80. Hypertension stage 1     Diastolic pressure: [id number]. Hypertension stage 2   Systolic pressure: [id number] or above.  Diastolic pressure: 90 or above. What health risks are associated with hypertension? Managing your hypertension is an important responsibility. Uncontrolled hypertension can lead to:  A heart attack.  A stroke.  A  weakened blood vessel (aneurysm).  Heart failure.  Kidney damage.  Eye damage.  Metabolic syndrome.  Memory and concentration problems. What changes can I make to manage my hypertension? Eating and drinking   Eat a diet that is high in fiber and potassium, and low in salt (sodium), added sugar, and fat. An example eating plan is called the DASH (Dietary Approaches to Stop Hypertension) diet. To eat this way:  Eat plenty of fresh fruits and vegetables. Try to fill half of your plate at each meal with fruits and vegetables.  Eat whole grains, such as whole wheat pasta, brown rice, or whole grain bread. Fill about one quarter of your plate with whole grains.  Eat low-fat diary products.  Avoid fatty cuts of meat, processed or cured meats, and poultry with skin. Fill about one quarter of your plate with lean proteins such as fish, chicken without skin, beans, eggs, and tofu.  Avoid premade and processed foods. These tend to be higher in sodium, added sugar, and fat.     Lifestyle   Work with your health care provider to maintain a healthy body weight, or to lose weight. Ask what an ideal weight is for you.  Get at least 30 minutes of exercise that causes your heart to beat faster (aerobic exercise) most days of the week. Activities may include walking, swimming, or biking.       Monitoring   Monitor your blood pressure at home as told by your health care provider. Your personal target blood pressure may vary depending on your medical conditions, your age, and other  factors.  Have your blood pressure checked regularly, as often as told by your health care provider. Working with your health care provider   Review all the medicines you take with your health care provider because there may be side effects or interactions.  Talk with your health care provider about your diet, exercise habits, and other lifestyle factors that may be contributing to hypertension.  Visit your  health care provider regularly. Your health care provider can help you create and adjust your plan for managing hypertension. Will I need medicine to control my blood pressure? Your health care provider may prescribe medicine if lifestyle changes are not enough to get your blood pressure under control, and if:  Your systolic blood pressure is 130 or higher.  Your diastolic blood pressure is 80 or higher. Take medicines only as told by your health care provider. Follow the directions carefully. Blood pressure medicines must be taken as prescribed. The medicine does not work as well when you skip doses. Skipping doses also puts you at risk for problems. Contact a health care provider if:  You think you are having a reaction to medicines you have taken.  You have repeated (recurrent) headaches.  You feel dizzy.  You have swelling in your ankles.  You have trouble with your vision. Get help right away if:  You develop a severe headache or confusion.  You have unusual weakness or numbness, or you feel faint.  You have severe pain in your chest or abdomen.  You vomit repeatedly.  You have trouble breathing. Summary  Hypertension is when the force of blood pumping through your arteries is too strong. If this condition is not controlled, it may put you at risk for serious complications.  Your personal target blood pressure may vary depending on your medical conditions, your age, and other factors. For most people, a normal blood pressure is less than 120/80.  Hypertension is managed by lifestyle changes, medicines, or both. Lifestyle changes include weight loss, eating a healthy, low-sodium diet, exercising more, and limiting alcohol. This information is not intended to replace advice given to you by your health care provider. Make sure you discuss any questions you have with your health care provider. Document Released: 08/22/2012 Document Revised: 10/26/2016 Document Reviewed:  10/26/2016 Elsevier Interactive Patient Education  2017 [employer].

## 2017-02-13 NOTE — Progress Notes (Signed)
Subjective:    Patient ID: Jill Torres, female    DOB: [birth year], 66 y.o.   MRN: [id number]  CC: Jill Torres is a 66 y.o. female who presents today for an acute visit.    HPI: CC: anxiety worsening this past week. Working in [employer] and feels lots of pressure. Feeling 'shakey inside'. No CP, SOB.  No depression, thoughts of hurting herself or anyone else. Not sleeping well.  Stopped prozac.  BP at home has been running 140/70.  Recent weight gain. Limits salt. Denies exertional chest pain or pressure, numbness or tingling radiating to left arm or jaw, palpitations, dizziness, frequent headaches, changes in vision, or shortness of breath.          HISTORY:  Past Medical History:  Diagnosis Date  . Allergy   . Arthritis   . Depression   . Diverticulosis of colon (without mention of hemorrhage)   . GERD (gastroesophageal reflux disease)   . History of chicken pox   . History of shingles may 2013  . Irritable bowel syndrome   . Unspecified hypothyroidism    Past Surgical History:  Procedure Laterality Date  . ABDOMINAL HYSTERECTOMY  1983   due to heavy bleeding & ovary cyst  . APPENDECTOMY  1974  . BLADDER DIVERTICULECTOMY  1990  . CESAREAN SECTION     x 2  . CHOLECYSTECTOMY     x 2  . DILATION AND CURETTAGE OF UTERUS     x 2  . DIRECT LARYNGOSCOPY Right 06/11/2015   Procedure: suspension microdirect laryngoscopy with biopsy of right tongue base;  Surgeon: Carloyn Manner, MD;  Location: [facility] ORS;  Service: ENT;  Laterality: Right;  . NASAL SEPTUM SURGERY  1999  . PARTIAL HYSTERECTOMY  1983   cyst on ovary and heavy bleeding    Family History  Problem Relation Age of Onset  . Arthritis Mother   . Hypertension Mother   . Hyperlipidemia Mother   . Atrial fibrillation Mother   . Heart disease Mother   . Diabetes Mother   . Hyperlipidemia Father   . Heart disease Father     s/p CABG  . Stroke Father   . Rheumatic fever Sister     s/p valve  replacement  . Deep vein thrombosis Brother     after immobilization   . Breast cancer Neg Hx     Allergies: Patient has no known allergies. Current Outpatient Prescriptions on File Prior to Visit  Medication Sig Dispense Refill  . bifidobacterium infantis (ALIGN) capsule Take 1 capsule by mouth daily.      . calcium-vitamin D (OSCAL WITH D 500-200) 500-200 MG-UNIT per tablet Take 1 tablet by mouth daily.      . cetirizine (ZYRTEC) 10 MG chewable tablet Chew 10 mg by mouth daily.     . Coenzyme Q10 (COQ10 PO) Take by mouth daily.    . fish oil-omega-3 fatty acids 1000 MG capsule Take 2 g by mouth daily.    Jill Torres ibuprofen (ADVIL,MOTRIN) 200 MG tablet Take 200 mg by mouth as directed.      . multivitamin (THERAGRAN) per tablet Take 1 tablet by mouth daily.      Jill Torres neomycin-polymyxin-hydrocortisone (CORTISPORIN) 3.5-10000-1 otic suspension Place 3 drops into both ears 3 (three) times daily as needed. 10 mL 0  . polyethylene glycol (MIRALAX) powder Take 17 g by mouth as needed.     . pravastatin (PRAVACHOL) 10 MG tablet TAKE 1 TABLET (10 MG TOTAL) BY  MOUTH DAILY. 30 tablet 2  . Triamcinolone Acetonide (NASACORT AQ NA) Place into the nose.     No current facility-administered medications on file prior to visit.     Social History  Substance Use Topics  . Smoking status: Never Smoker  . Smokeless tobacco: Never Used  . Alcohol use No    Review of Systems  Constitutional: Negative for chills and fever.  Respiratory: Negative for cough, shortness of breath and wheezing.   Cardiovascular: Negative for chest pain and palpitations.  Gastrointestinal: Negative for nausea and vomiting.  Neurological: Negative for headaches.  Psychiatric/Behavioral: The patient is nervous/anxious.       Objective:    BP (!) 160/80   Pulse (!) 59   Temp 98.1 F (36.7 C) (Oral)   Wt 188 lb 9.6 oz (85.5 kg)   LMP 12/12/1981   SpO2 93%   BMI 32.37 kg/m  BP Readings from Last 3 Encounters:  02/13/17 (!)  160/80  11/24/16 (!) 144/82  07/18/16 138/82     Physical Exam  Constitutional: She appears well-developed and well-nourished.  Eyes: Conjunctivae are normal.  Cardiovascular: Normal rate, regular rhythm, normal heart sounds and normal pulses.   Pulmonary/Chest: Effort normal and breath sounds normal. She has no wheezes. She has no rhonchi. She has no rales.  Neurological: She is alert.  Skin: Skin is warm and dry.  Psychiatric: She has a normal mood and affect. Her speech is normal and behavior is normal. Thought content normal.  Vitals reviewed.      Assessment & Plan:   Problem List Items Addressed This Visit      Other   Anxiety - Primary    Worsening. We'll restart Prozac. Gave patient small prescription for Xanax to use intermittently to help with sleep or anxiety attacks. Close follow up.       Relevant Medications   ALPRAZolam (XANAX) 0.25 MG tablet   FLUoxetine (PROZAC) 10 MG capsule   Elevated blood pressure reading    Elevated today. At home, SBP also elevated.No CP, SOB. suspect recent weight gain is contributing. Very likely that anxiety is also as well. However as discussed with patient, I was concerned with her numbers today and would like patient to start 2.5 amlodipine. Patient understands to monitor blood pressure first and if higher than goal 140/ 90, patient will take medication. Patient will follow-up in one week and patient will bring BP cuff.      Relevant Medications   amLODipine (NORVASC) 2.5 MG tablet         I have changed Jill Torres's ALPRAZolam and FLUoxetine. I am also having her start on amLODipine. Additionally, I am having her maintain her ibuprofen, bifidobacterium infantis, calcium-vitamin D, polyethylene glycol powder, multivitamin, cetirizine, fish oil-omega-3 fatty acids, Coenzyme Q10 (COQ10 PO), Triamcinolone Acetonide (NASACORT AQ NA), neomycin-polymyxin-hydrocortisone, and pravastatin.   Meds ordered this encounter  Medications   . ALPRAZolam (XANAX) 0.25 MG tablet    Sig: Take 1 tablet (0.25 mg total) by mouth daily as needed. for anxiety    Dispense:  10 tablet    Refill:  0    Not to exceed 5 additional fills before 02/03/2016    Order Specific Question:   Supervising Provider    Answer:   Deborra Medina L [[id number]]  . FLUoxetine (PROZAC) 10 MG capsule    Sig: Take 1 capsule (10 mg total) by mouth daily.    Dispense:  90 capsule    Refill:  1  Order Specific Question:   Supervising Provider    Answer:   Crecencio Mc [[id number]]  . amLODipine (NORVASC) 2.5 MG tablet    Sig: Take 1 tablet (2.5 mg total) by mouth daily.    Dispense:  30 tablet    Refill:  0    Order Specific Question:   Supervising Provider    Answer:   Crecencio Mc [[id number]]    Return precautions given.   Risks, benefits, and alternatives of the medications and treatment plan prescribed today were discussed, and patient expressed understanding.   Education regarding symptom management and diagnosis given to patient on AVS.  Continue to follow with [name], MD for routine health maintenance.   [facility] and I agreed with plan.   Mable Paris, FNP

## 2017-02-13 NOTE — Progress Notes (Signed)
Pre visit review using our clinic review tool, if applicable. No additional management support is needed unless otherwise documented below in the visit note. 

## 2017-02-14 ENCOUNTER — Encounter: Payer: Self-pay | Admitting: Family

## 2017-02-14 NOTE — Assessment & Plan Note (Addendum)
Elevated today. At home, SBP also elevated.No CP, SOB. suspect recent weight gain is contributing. Very likely that anxiety is also as well. However as discussed with patient, I was concerned with her numbers today and would like patient to start 2.5 amlodipine. Patient understands to monitor blood pressure first and if higher than goal 140/ 90, patient will take medication. Patient will follow-up in one week and patient will bring BP cuff.

## 2017-02-14 NOTE — Assessment & Plan Note (Addendum)
Worsening. We'll restart Prozac. Gave patient small prescription for Xanax to use intermittently to help with sleep or anxiety attacks. Close follow up.

## 2017-02-20 ENCOUNTER — Encounter: Payer: Self-pay | Admitting: Family

## 2017-02-20 ENCOUNTER — Ambulatory Visit (INDEPENDENT_AMBULATORY_CARE_PROVIDER_SITE_OTHER): Payer: Managed Care, Other (non HMO) | Admitting: Family

## 2017-02-20 VITALS — BP 135/80 | HR 80 | Temp 98.5°F | Ht 64.0 in | Wt 186.2 lb

## 2017-02-20 DIAGNOSIS — I10 Essential (primary) hypertension: Secondary | ICD-10-CM

## 2017-02-20 NOTE — Patient Instructions (Addendum)
Stay on amlodipine for now.   Please message me with values of BP so we can decide if we move to 5mg  daily of amlodipine.   Pleasure seeing you.

## 2017-02-20 NOTE — Progress Notes (Signed)
Pre visit review using our clinic review tool, if applicable. No additional management support is needed unless otherwise documented below in the visit note. 
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
## 2017-02-21 ENCOUNTER — Encounter: Payer: Self-pay | Admitting: Family

## 2017-02-21 NOTE — Assessment & Plan Note (Addendum)
Overall controlled. Doing well on amlodipine 2.5. Blood pressure slightly above goal today however came down as she rested in the room. No CP, SOB.  Calibrated BP cuff in office and seemed quite accurate. Patient and I jointly agreed to stay at current amlodipine dose. She will continue to monitor and call or message me readings over the next week. Will follow up with PCP.

## 2017-02-24 ENCOUNTER — Encounter: Payer: Self-pay | Admitting: Family

## 2017-03-13 ENCOUNTER — Other Ambulatory Visit: Payer: Self-pay | Admitting: Family

## 2017-03-13 DIAGNOSIS — R030 Elevated blood-pressure reading, without diagnosis of hypertension: Secondary | ICD-10-CM

## 2017-03-17 ENCOUNTER — Other Ambulatory Visit: Payer: Self-pay | Admitting: Internal Medicine

## 2017-03-21 ENCOUNTER — Encounter: Payer: Self-pay | Admitting: Family

## 2017-03-21 DIAGNOSIS — R030 Elevated blood-pressure reading, without diagnosis of hypertension: Secondary | ICD-10-CM

## 2017-03-22 MED ORDER — AMLODIPINE BESYLATE 2.5 MG PO TABS: 2.5000 mg | ORAL_TABLET | Freq: Every day | ORAL | 2 refills | Status: DC

## 2017-03-22 NOTE — Telephone Encounter (Signed)
Medication has been refilled.

## 2017-03-23 ENCOUNTER — Other Ambulatory Visit: Payer: Managed Care, Other (non HMO)

## 2017-03-27 ENCOUNTER — Ambulatory Visit: Payer: Managed Care, Other (non HMO) | Admitting: Internal Medicine

## 2017-04-08 ENCOUNTER — Other Ambulatory Visit: Payer: Self-pay | Admitting: Family

## 2017-04-08 DIAGNOSIS — R030 Elevated blood-pressure reading, without diagnosis of hypertension: Secondary | ICD-10-CM

## 2017-04-17 ENCOUNTER — Other Ambulatory Visit (INDEPENDENT_AMBULATORY_CARE_PROVIDER_SITE_OTHER): Payer: Managed Care, Other (non HMO)

## 2017-04-17 DIAGNOSIS — E7800 Pure hypercholesterolemia, unspecified: Secondary | ICD-10-CM

## 2017-04-17 LAB — LIPID PANEL
CHOLESTEROL: 212 mg/dL — AB (ref 0–200)
HDL: 60.5 mg/dL (ref >39.00)
LDL Cholesterol: 126 mg/dL — ABNORMAL HIGH (ref 0–99)
NONHDL: 151.77
Total CHOL/HDL Ratio: 4
Triglycerides: 131 mg/dL (ref 0.0–149.0)
VLDL: 26.2 mg/dL (ref 0.0–40.0)

## 2017-04-17 LAB — HEPATIC FUNCTION PANEL
ALBUMIN: 4.2 g/dL (ref 3.5–5.2)
ALT: 11 U/L (ref 0–35)
AST: 15 U/L (ref 0–37)
Alkaline Phosphatase: 53 U/L (ref 39–117)
Bilirubin, Direct: 0.1 mg/dL (ref 0.0–0.3)
TOTAL PROTEIN: 7.2 g/dL (ref 6.0–8.3)
Total Bilirubin: 0.6 mg/dL (ref 0.2–1.2)

## 2017-04-17 LAB — BASIC METABOLIC PANEL
BUN: 12 mg/dL (ref 6–23)
CALCIUM: 9.4 mg/dL (ref 8.4–10.5)
CO2: 24 mEq/L (ref 19–32)
Chloride: 105 mEq/L (ref 96–112)
Creatinine, Ser: 1.01 mg/dL (ref 0.40–1.20)
GFR: 58.51 mL/min — AB (ref >60.00)
GLUCOSE: 93 mg/dL (ref 70–99)
Potassium: 3.8 mEq/L (ref 3.5–5.1)
SODIUM: 137 meq/L (ref 135–145)

## 2017-04-18 ENCOUNTER — Encounter: Payer: Self-pay | Admitting: Internal Medicine

## 2017-04-19 ENCOUNTER — Encounter: Payer: Self-pay | Admitting: Internal Medicine

## 2017-04-19 ENCOUNTER — Ambulatory Visit (INDEPENDENT_AMBULATORY_CARE_PROVIDER_SITE_OTHER): Payer: Managed Care, Other (non HMO) | Admitting: Internal Medicine

## 2017-04-19 DIAGNOSIS — F419 Anxiety disorder, unspecified: Secondary | ICD-10-CM | POA: Diagnosis not present

## 2017-04-19 DIAGNOSIS — E7800 Pure hypercholesterolemia, unspecified: Secondary | ICD-10-CM

## 2017-04-19 DIAGNOSIS — I10 Essential (primary) hypertension: Secondary | ICD-10-CM | POA: Diagnosis not present

## 2017-04-19 DIAGNOSIS — Z9109 Other allergy status, other than to drugs and biological substances: Secondary | ICD-10-CM

## 2017-04-19 MED ORDER — PRAVASTATIN SODIUM 20 MG PO TABS: 20.0000 mg | ORAL_TABLET | Freq: Every day | ORAL | 3 refills | Status: DC

## 2017-04-19 MED ORDER — AZELASTINE HCL 0.1 % NA SOLN: 1.0000 | Freq: Two times a day (BID) | NASAL | 1 refills | Status: DC

## 2017-04-19 MED ORDER — NEOMYCIN-POLYMYXIN-HC 3.5-10000-1 OT SUSP: 3.0000 [drp] | Freq: Three times a day (TID) | OTIC | 0 refills | Status: DC | PRN

## 2017-04-19 NOTE — Progress Notes (Signed)
Patient ID: Jill Torres, female   DOB: [birth year], 66 y.o.   MRN: [id number]   Subjective:    Patient ID: Jill Torres, female    DOB: 02/06/1951, 66 y.o.   MRN: [id number]  HPI  Patient here for a scheduled follow up.  She reports she is doing relatively well.  Tries to stay active.  No chest pain. No sob.  Some increased and persistent drainage.  Using flonase and zyrtec.  Feels needs something more. Discussed her lab results.  Discussed cholesterol.  On low dose pravastatin.  Discussed increasing the dose.  No acid reflux.  No abdominal pain.  Bowels moving.     Past Medical History:  Diagnosis Date  . Allergy   . Arthritis   . Depression   . Diverticulosis of colon (without mention of hemorrhage)   . GERD (gastroesophageal reflux disease)   . History of chicken pox   . History of shingles may 2013  . HTN (hypertension)   . Irritable bowel syndrome   . Unspecified hypothyroidism    Past Surgical History:  Procedure Laterality Date  . ABDOMINAL HYSTERECTOMY  1983   due to heavy bleeding & ovary cyst  . APPENDECTOMY  1974  . BLADDER DIVERTICULECTOMY  1990  . CESAREAN SECTION     x 2  . CHOLECYSTECTOMY     x 2  . DILATION AND CURETTAGE OF UTERUS     x 2  . DIRECT LARYNGOSCOPY Right 06/11/2015   Procedure: suspension microdirect laryngoscopy with biopsy of right tongue base;  Surgeon: Carloyn Manner, MD;  Location: [facility];  Service: ENT;  Laterality: Right;  . NASAL SEPTUM SURGERY  1999  . PARTIAL HYSTERECTOMY  1983   cyst on ovary and heavy bleeding    Family History  Problem Relation Age of Onset  . Arthritis Mother   . Hypertension Mother   . Hyperlipidemia Mother   . Atrial fibrillation Mother   . Heart disease Mother   . Diabetes Mother   . Hyperlipidemia Father   . Heart disease Father        s/p CABG  . Stroke Father   . Rheumatic fever Sister        s/p valve replacement  . Deep vein thrombosis Brother        after immobilization   . Breast  cancer Neg Hx    Social History   Social History  . Marital status: Single    Spouse name: N/A  . Number of children: 2  . Years of education: N/A   Occupational History  . Sales     [employer]    Social History Main Topics  . Smoking status: Never Smoker  . Smokeless tobacco: Never Used  . Alcohol use No  . Drug use: No  . Sexual activity: Not Asked   Other Topics Concern  . None   Social History Narrative   No regular exercise   Lives in noisy environment- very stressful    Daily caffeine use     Outpatient Encounter Prescriptions as of 04/19/2017  Medication Sig  . amLODipine (NORVASC) 2.5 MG tablet Take 1 tablet (2.5 mg total) by mouth daily.  . bifidobacterium infantis (ALIGN) capsule Take 1 capsule by mouth daily.    . calcium-vitamin D (OSCAL WITH D 500-200) 500-200 MG-UNIT per tablet Take 1 tablet by mouth daily.    . cetirizine (ZYRTEC) 10 MG chewable tablet Chew 10 mg by mouth daily.   Marland Kitchen  Coenzyme Q10 (COQ10 PO) Take by mouth daily.  . fish oil-omega-3 fatty acids 1000 MG capsule Take 2 g by mouth daily.  Marland Kitchen FLUoxetine (PROZAC) 10 MG capsule Take 1 capsule (10 mg total) by mouth daily.  . Fluticasone Furoate (FLONASE SENSIMIST NA) Place into the nose.  . ibuprofen (ADVIL,MOTRIN) 200 MG tablet Take 200 mg by mouth as directed.    . multivitamin (THERAGRAN) per tablet Take 1 tablet by mouth daily.    Marland Kitchen neomycin-polymyxin-hydrocortisone (CORTISPORIN) 3.5-10000-1 otic suspension Place 3 drops into both ears 3 (three) times daily as needed.  . polyethylene glycol (MIRALAX) powder Take 17 g by mouth as needed.   . [DISCONTINUED] neomycin-polymyxin-hydrocortisone (CORTISPORIN) 3.5-10000-1 otic suspension Place 3 drops into both ears 3 (three) times daily as needed.  . [DISCONTINUED] pravastatin (PRAVACHOL) 10 MG tablet TAKE 1 TABLET (10 MG TOTAL) BY MOUTH DAILY.  . [DISCONTINUED] Triamcinolone Acetonide (NASACORT AQ NA) Place into the nose.  Marland Kitchen azelastine  (ASTELIN) 0.1 % nasal spray Place 1 spray into both nostrils 2 (two) times daily. Use in each nostril as directed  . pravastatin (PRAVACHOL) 20 MG tablet Take 1 tablet (20 mg total) by mouth daily.  . [DISCONTINUED] ALPRAZolam (XANAX) 0.25 MG tablet Take 1 tablet (0.25 mg total) by mouth daily as needed. for anxiety   No facility-administered encounter medications on file as of 04/19/2017.     Review of Systems  Constitutional: Negative for appetite change and unexpected weight change.  HENT: Positive for congestion and postnasal drip. Negative for sinus pressure.   Respiratory: Negative for cough, chest tightness and shortness of breath.   Cardiovascular: Negative for chest pain, palpitations and leg swelling.  Gastrointestinal: Negative for abdominal pain, diarrhea, nausea and vomiting.  Genitourinary: Negative for difficulty urinating and dysuria.  Musculoskeletal: Negative for back pain and joint swelling.  Skin: Negative for color change and rash.  Neurological: Negative for dizziness, light-headedness and headaches.  Psychiatric/Behavioral: Negative for agitation and dysphoric mood.       Objective:     Blood pressure rechecked 122/78  Physical Exam  Constitutional: She appears well-developed and well-nourished. No distress.  HENT:  Nose: Nose normal.  Mouth/Throat: Oropharynx is clear and moist.  Neck: Neck supple. No thyromegaly present.  Cardiovascular: Normal rate and regular rhythm.   Pulmonary/Chest: Breath sounds normal. No respiratory distress. She has no wheezes.  Abdominal: Soft. Bowel sounds are normal. There is no tenderness.  Musculoskeletal: She exhibits no edema or tenderness.  Lymphadenopathy:    She has no cervical adenopathy.  Skin: No rash noted. No erythema.  Psychiatric: She has a normal mood and affect. Her behavior is normal.    BP 122/78   Pulse 65   Temp 98.3 F (36.8 C) (Oral)   Wt 183 lb (83 kg)   LMP 12/12/1981   SpO2 99%   BMI 31.41  kg/m  Wt Readings from Last 3 Encounters:  04/19/17 183 lb (83 kg)  02/20/17 186 lb 3.2 oz (84.5 kg)  02/13/17 188 lb 9.6 oz (85.5 kg)     Lab Results  Component Value Date   WBC 7.7 11/21/2016   HGB 13.8 11/21/2016   HCT 40.9 11/21/2016   PLT 349.0 11/21/2016   GLUCOSE 93 04/17/2017   CHOL 212 (H) 04/17/2017   TRIG 131.0 04/17/2017   HDL 60.50 04/17/2017   LDLDIRECT 155.8 06/27/2013   LDLCALC 126 (H) 04/17/2017   ALT 11 04/17/2017   AST 15 04/17/2017   NA 137 04/17/2017  K 3.8 04/17/2017   CL 105 04/17/2017   CREATININE 1.01 04/17/2017   BUN 12 04/17/2017   CO2 24 04/17/2017   TSH 0.74 11/21/2016    Mm Screening Breast Tomo Bilateral  Result Date: 10/21/2016 CLINICAL DATA:  Screening. EXAM: 2D DIGITAL SCREENING BILATERAL MAMMOGRAM WITH CAD AND ADJUNCT TOMO COMPARISON:  Previous exam(s). ACR Breast Density Category c: The breast tissue is heterogeneously dense, which may obscure small masses. FINDINGS: There are no findings suspicious for malignancy. Images were processed with CAD. IMPRESSION: No mammographic evidence of malignancy. A result letter of this screening mammogram will be mailed directly to the patient. RECOMMENDATION: Screening mammogram in one year. (Code:SM-B-01Y) BI-RADS CATEGORY  1: Negative. Electronically Signed   By: Everlean Alstrom M.D.   On: 10/21/2016 13:34       Assessment & Plan:   Problem List Items Addressed This Visit    Anxiety    On prozac.  Overall appears to be doing well.  Follow.        Environmental allergies    On zyrtec.  Using flonase.  Add astelin.  Let me know if persistent problems.  May need ENT referral.        HTN (hypertension)    Blood pressure on recheck improved.  Same medication regimen.  Follow pressures.  Follow metabolic panel.        Relevant Medications   pravastatin (PRAVACHOL) 20 MG tablet   Other Relevant Orders   Basic metabolic panel   Hypercholesterolemia    LDL 126.  With calculated risk, should  be on higher dose statin.  Increased to pravastatin 20mg  q day.  Follow.   Lab Results  Component Value Date   CHOL 212 (H) 04/17/2017   HDL 60.50 04/17/2017   LDLCALC 126 (H) 04/17/2017   LDLDIRECT 155.8 06/27/2013   TRIG 131.0 04/17/2017   CHOLHDL 4 04/17/2017        Relevant Medications   pravastatin (PRAVACHOL) 20 MG tablet   Other Relevant Orders   Hepatic function panel   Lipid panel       [name], MD

## 2017-05-01 ENCOUNTER — Encounter: Payer: Self-pay | Admitting: Internal Medicine

## 2017-05-01 NOTE — Assessment & Plan Note (Signed)
On prozac.  Overall appears to be doing well.  Follow.   

## 2017-05-01 NOTE — Assessment & Plan Note (Signed)
Blood pressure on recheck improved.  Same medication regimen.  Follow pressures.  Follow metabolic panel.   

## 2017-05-01 NOTE — Assessment & Plan Note (Signed)
LDL 126.  With calculated risk, should be on higher dose statin.  Increased to pravastatin 20mg  q day.  Follow.   Lab Results  Component Value Date   CHOL 212 (H) 04/17/2017   HDL 60.50 04/17/2017   LDLCALC 126 (H) 04/17/2017   LDLDIRECT 155.8 06/27/2013   TRIG 131.0 04/17/2017   CHOLHDL 4 04/17/2017

## 2017-05-01 NOTE — Assessment & Plan Note (Signed)
On zyrtec.  Using flonase.  Add astelin.  Let me know if persistent problems.  May need ENT referral.

## 2017-07-20 ENCOUNTER — Other Ambulatory Visit (INDEPENDENT_AMBULATORY_CARE_PROVIDER_SITE_OTHER): Payer: Managed Care, Other (non HMO)

## 2017-07-20 ENCOUNTER — Encounter: Payer: Self-pay | Admitting: Internal Medicine

## 2017-07-20 DIAGNOSIS — E7800 Pure hypercholesterolemia, unspecified: Secondary | ICD-10-CM

## 2017-07-20 DIAGNOSIS — I10 Essential (primary) hypertension: Secondary | ICD-10-CM | POA: Diagnosis not present

## 2017-07-20 LAB — HEPATIC FUNCTION PANEL
ALK PHOS: 68 U/L (ref 39–117)
ALT: 14 U/L (ref 0–35)
AST: 14 U/L (ref 0–37)
Albumin: 4.3 g/dL (ref 3.5–5.2)
BILIRUBIN DIRECT: 0.1 mg/dL (ref 0.0–0.3)
Total Bilirubin: 0.6 mg/dL (ref 0.2–1.2)
Total Protein: 7.4 g/dL (ref 6.0–8.3)

## 2017-07-20 LAB — BASIC METABOLIC PANEL
BUN: 12 mg/dL (ref 6–23)
CO2: 28 mEq/L (ref 19–32)
Calcium: 9.4 mg/dL (ref 8.4–10.5)
Chloride: 104 mEq/L (ref 96–112)
Creatinine, Ser: 1.12 mg/dL (ref 0.40–1.20)
GFR: 51.89 mL/min — AB (ref >60.00)
GLUCOSE: 102 mg/dL — AB (ref 70–99)
Potassium: 3.8 mEq/L (ref 3.5–5.1)
SODIUM: 139 meq/L (ref 135–145)

## 2017-07-20 LAB — LIPID PANEL
CHOL/HDL RATIO: 4
Cholesterol: 201 mg/dL — ABNORMAL HIGH (ref 0–200)
HDL: 55.8 mg/dL (ref >39.00)
LDL Cholesterol: 121 mg/dL — ABNORMAL HIGH (ref 0–99)
NONHDL: 145.38
Triglycerides: 122 mg/dL (ref 0.0–149.0)
VLDL: 24.4 mg/dL (ref 0.0–40.0)

## 2017-07-24 ENCOUNTER — Telehealth: Payer: Self-pay | Admitting: *Deleted

## 2017-07-24 ENCOUNTER — Encounter: Payer: Self-pay | Admitting: Internal Medicine

## 2017-07-24 ENCOUNTER — Ambulatory Visit (INDEPENDENT_AMBULATORY_CARE_PROVIDER_SITE_OTHER): Payer: Managed Care, Other (non HMO) | Admitting: Internal Medicine

## 2017-07-24 VITALS — BP 134/82 | HR 74 | Temp 98.3°F | Resp 12 | Wt 185.2 lb

## 2017-07-24 DIAGNOSIS — I10 Essential (primary) hypertension: Secondary | ICD-10-CM | POA: Diagnosis not present

## 2017-07-24 DIAGNOSIS — J329 Chronic sinusitis, unspecified: Secondary | ICD-10-CM | POA: Diagnosis not present

## 2017-07-24 DIAGNOSIS — Z9109 Other allergy status, other than to drugs and biological substances: Secondary | ICD-10-CM | POA: Diagnosis not present

## 2017-07-24 DIAGNOSIS — E7800 Pure hypercholesterolemia, unspecified: Secondary | ICD-10-CM | POA: Diagnosis not present

## 2017-07-24 DIAGNOSIS — K589 Irritable bowel syndrome without diarrhea: Secondary | ICD-10-CM

## 2017-07-24 DIAGNOSIS — F419 Anxiety disorder, unspecified: Secondary | ICD-10-CM

## 2017-07-24 MED ORDER — AMOXICILLIN 875 MG PO TABS: 875.0000 mg | ORAL_TABLET | Freq: Two times a day (BID) | ORAL | 0 refills | Status: DC

## 2017-07-24 MED ORDER — PRAVASTATIN SODIUM 40 MG PO TABS: 40.0000 mg | ORAL_TABLET | Freq: Every day | ORAL | 3 refills | Status: DC

## 2017-07-24 NOTE — Assessment & Plan Note (Signed)
Blood pressure on recheck improved.  Continue same medication regimen.  Follow pressures.  Follow metabolic panel.   

## 2017-07-24 NOTE — Assessment & Plan Note (Signed)
On pravastatin.  LDL still in the 120s.  Will increase pravastatin to 40mg  q day.  Follow lipid panel and liver function tests.

## 2017-07-24 NOTE — Progress Notes (Signed)
Patient ID: Jill Torres, female   DOB: January 22, 1951, 66 y.o.   MRN: [id number]   Subjective:    Patient ID: Jill Torres, female    DOB: 1951/10/30, 66 y.o.   MRN: [id number]  HPI  Patient here for a scheduled follow up.  She states she is doing relatively well.  Still dealing with increased stress at work. Feels she is handling things relatively well.  Has good support.  Does not feel needs anything more at this time.  She  Is having problems with increase sinus congestion and pressure.  Has allergies.  astelin was working.  Recently started noticing increased congestion and over the last several days this has progressed.  Increased sinus pressure.  Increased drainage.  Cough productive of green mucus.  No chest congestion.  No sob.  No acid reflux.  Still with intermittent flares with her IBS.  Some discomfort.  Request f/u with Dr Maurene Capes to f/u regarding her IBS.     Past Medical History:  Diagnosis Date  . Allergy   . Arthritis   . Depression   . Diverticulosis of colon (without mention of hemorrhage)   . GERD (gastroesophageal reflux disease)   . History of chicken pox   . History of shingles may 2013  . HTN (hypertension)   . Irritable bowel syndrome   . Unspecified hypothyroidism    Past Surgical History:  Procedure Laterality Date  . ABDOMINAL HYSTERECTOMY  1983   due to heavy bleeding & ovary cyst  . APPENDECTOMY  1974  . BLADDER DIVERTICULECTOMY  1990  . CESAREAN SECTION     x 2  . CHOLECYSTECTOMY     x 2  . DILATION AND CURETTAGE OF UTERUS     x 2  . DIRECT LARYNGOSCOPY Right 06/11/2015   Procedure: suspension microdirect laryngoscopy with biopsy of right tongue base;  Surgeon: Carloyn Manner, MD;  Location: [facility] ORS;  Service: ENT;  Laterality: Right;  . NASAL SEPTUM SURGERY  1999  . PARTIAL HYSTERECTOMY  1983   cyst on ovary and heavy bleeding    Family History  Problem Relation Age of Onset  . Arthritis Mother   . Hypertension Mother   . Hyperlipidemia  Mother   . Atrial fibrillation Mother   . Heart disease Mother   . Diabetes Mother   . Hyperlipidemia Father   . Heart disease Father        s/p CABG  . Stroke Father   . Rheumatic fever Sister        s/p valve replacement  . Deep vein thrombosis Brother        after immobilization   . Breast cancer Neg Hx    Social History   Social History  . Marital status: Single    Spouse name: N/A  . Number of children: 2  . Years of education: N/A   Occupational History  . Sales     [employer]    Social History Main Topics  . Smoking status: Never Smoker  . Smokeless tobacco: Never Used  . Alcohol use No  . Drug use: No  . Sexual activity: Not Asked   Other Topics Concern  . None   Social History Narrative   No regular exercise   Lives in noisy environment- very stressful    Daily caffeine use     Outpatient Encounter Prescriptions as of 07/24/2017  Medication Sig  . amLODipine (NORVASC) 2.5 MG tablet Take 1 tablet (2.5 mg total)  by mouth daily.  Marland Kitchen azelastine (ASTELIN) 0.1 % nasal spray Place 1 spray into both nostrils 2 (two) times daily. Use in each nostril as directed  . bifidobacterium infantis (ALIGN) capsule Take 1 capsule by mouth daily.    . calcium-vitamin D (OSCAL WITH D 500-200) 500-200 MG-UNIT per tablet Take 1 tablet by mouth daily.    . cetirizine (ZYRTEC) 10 MG chewable tablet Chew 10 mg by mouth daily.   . Coenzyme Q10 (COQ10 PO) Take by mouth daily.  . fish oil-omega-3 fatty acids 1000 MG capsule Take 2 g by mouth daily.  Marland Kitchen FLUoxetine (PROZAC) 10 MG capsule Take 1 capsule (10 mg total) by mouth daily.  . Fluticasone Furoate (FLONASE SENSIMIST NA) Place into the nose.  . ibuprofen (ADVIL,MOTRIN) 200 MG tablet Take 200 mg by mouth as directed.    . multivitamin (THERAGRAN) per tablet Take 1 tablet by mouth daily.    Marland Kitchen neomycin-polymyxin-hydrocortisone (CORTISPORIN) 3.5-10000-1 otic suspension Place 3 drops into both ears 3 (three) times daily as  needed.  . polyethylene glycol (MIRALAX) powder Take 17 g by mouth as needed.   . [DISCONTINUED] pravastatin (PRAVACHOL) 20 MG tablet Take 1 tablet (20 mg total) by mouth daily.  Marland Kitchen amoxicillin (AMOXIL) 875 MG tablet Take 1 tablet (875 mg total) by mouth 2 (two) times daily.  . pravastatin (PRAVACHOL) 40 MG tablet Take 1 tablet (40 mg total) by mouth daily.   No facility-administered encounter medications on file as of 07/24/2017.     Review of Systems  Constitutional: Negative for appetite change, fever and unexpected weight change.  HENT: Positive for congestion, postnasal drip and sinus pressure.   Respiratory: Positive for cough. Negative for chest tightness and shortness of breath.   Cardiovascular: Negative for chest pain, palpitations and leg swelling.  Gastrointestinal: Negative for nausea and vomiting.       Intermittent IBS flares.    Genitourinary: Negative for difficulty urinating and dysuria.  Musculoskeletal: Negative for back pain and joint swelling.  Skin: Negative for color change and rash.  Neurological: Negative for dizziness, light-headedness and headaches.  Psychiatric/Behavioral: Negative for agitation and dysphoric mood.       Increased stress as outlined.         Objective:    Physical Exam  Constitutional: She appears well-developed and well-nourished. No distress.  HENT:  Mouth/Throat: Oropharynx is clear and moist.  Nares:  Slightly erythematous turbinates.  Tenderness to palpation over her maxillary sinus and frontal sinus.   Neck: Neck supple. No thyromegaly present.  Cardiovascular: Normal rate and regular rhythm.   Pulmonary/Chest: Breath sounds normal. No respiratory distress. She has no wheezes.  Abdominal: Soft. Bowel sounds are normal. There is no tenderness.  Musculoskeletal: She exhibits no edema or tenderness.  Lymphadenopathy:    She has no cervical adenopathy.  Skin: No rash noted. No erythema.  Psychiatric: She has a normal mood and  affect. Her behavior is normal.    BP 134/82   Pulse 74   Temp 98.3 F (36.8 C) (Oral)   Resp 12   Wt 185 lb 3.2 oz (84 kg)   LMP 12/12/1981   SpO2 98%   BMI 31.79 kg/m  Wt Readings from Last 3 Encounters:  07/24/17 185 lb 3.2 oz (84 kg)  04/19/17 183 lb (83 kg)  02/20/17 186 lb 3.2 oz (84.5 kg)     Lab Results  Component Value Date   WBC 7.7 11/21/2016   HGB 13.8 11/21/2016   HCT 40.9  11/21/2016   PLT 349.0 11/21/2016   GLUCOSE 102 (H) 07/20/2017   CHOL 201 (H) 07/20/2017   TRIG 122.0 07/20/2017   HDL 55.80 07/20/2017   LDLDIRECT 155.8 06/27/2013   LDLCALC 121 (H) 07/20/2017   ALT 14 07/20/2017   AST 14 07/20/2017   NA 139 07/20/2017   K 3.8 07/20/2017   CL 104 07/20/2017   CREATININE 1.12 07/20/2017   BUN 12 07/20/2017   CO2 28 07/20/2017   TSH 0.74 11/21/2016    Mm Screening Breast Tomo Bilateral  Result Date: 10/21/2016 CLINICAL DATA:  Screening. EXAM: 2D DIGITAL SCREENING BILATERAL MAMMOGRAM WITH CAD AND ADJUNCT TOMO COMPARISON:  Previous exam(s). ACR Breast Density Category c: The breast tissue is heterogeneously dense, which may obscure small masses. FINDINGS: There are no findings suspicious for malignancy. Images were processed with CAD. IMPRESSION: No mammographic evidence of malignancy. A result letter of this screening mammogram will be mailed directly to the patient. RECOMMENDATION: Screening mammogram in one year. (Code:SM-B-01Y) BI-RADS CATEGORY  1: Negative. Electronically Signed   By: Everlean Alstrom M.D.   On: 10/21/2016 13:34       Assessment & Plan:   Problem List Items Addressed This Visit    Anxiety    On prozac.  Overall stable.  Follow.        Environmental allergies    astelin was helping.  Now with sinus congestion.  Treat for sinus infection.        HTN (hypertension)    Blood pressure on recheck improved.  Continue same medication regimen.  Follow pressures.  Follow metabolic panel.        Relevant Medications    pravastatin (PRAVACHOL) 40 MG tablet   Other Relevant Orders   CBC with Differential/Platelet   TSH   Basic metabolic panel   Hypercholesterolemia    On pravastatin.  LDL still in the 120s.  Will increase pravastatin to 40mg  q day.  Follow lipid panel and liver function tests.        Relevant Medications   pravastatin (PRAVACHOL) 40 MG tablet   Other Relevant Orders   Hepatic function panel   Lipid panel   IRRITABLE BOWEL SYNDROME    With intermittent flares.  Previously saw Dr Maurene Capes.  She request f/u appt with Dr Maurene Capes.         Other Visit Diagnoses    Sinusitis, unspecified chronicity, unspecified location    -  Primary   Treat with saline nasal spray, flonase and astelin as directed.  mucinex and robitussin DM as directed.  amoxicillin if symptoms worsen.     Relevant Medications   amoxicillin (AMOXIL) 875 MG tablet       [name], MD

## 2017-07-24 NOTE — Progress Notes (Signed)
Pre-visit discussion using our clinic review tool. No additional management support is needed unless otherwise documented below in the visit note.  

## 2017-07-24 NOTE — Assessment & Plan Note (Signed)
astelin was helping.  Now with sinus congestion.  Treat for sinus infection.

## 2017-07-24 NOTE — Assessment & Plan Note (Signed)
With intermittent flares.  Previously saw Dr Maurene Capes.  She request f/u appt with Dr Maurene Capes.

## 2017-07-24 NOTE — Telephone Encounter (Signed)
If you will put orders in I will call patient.

## 2017-07-24 NOTE — Patient Instructions (Signed)
Saline nasal spray - continue to flush nose as you are doing.  Continue astelin  Can add flonase nasal spray - 2 sprays each nostril one time per day.  Do this in the evening.    Stop tylenol sinus.  Start mucinex in the am and can use robitussin DM in the evening.

## 2017-07-24 NOTE — Assessment & Plan Note (Signed)
On prozac.  Overall stable.  Follow.

## 2017-07-24 NOTE — Telephone Encounter (Signed)
Patient has requested lab orders placed prior to her physical in December

## 2017-07-25 NOTE — Telephone Encounter (Signed)
Orders placed for labs.  It looks like she already has lab appt.

## 2017-07-26 ENCOUNTER — Other Ambulatory Visit: Payer: Self-pay | Admitting: Internal Medicine

## 2017-08-07 ENCOUNTER — Other Ambulatory Visit: Payer: Self-pay | Admitting: Family

## 2017-08-07 DIAGNOSIS — F419 Anxiety disorder, unspecified: Secondary | ICD-10-CM

## 2017-09-12 ENCOUNTER — Telehealth: Payer: Self-pay | Admitting: *Deleted

## 2017-09-12 NOTE — Telephone Encounter (Signed)
Patient is having left hand pain. She also has a upset stomach with congestion. Pt has requested to see Dr. Nicki Reaper . Please give a time and date  Pt contact [phone]

## 2017-09-12 NOTE — Telephone Encounter (Signed)
Called patient states that hand swelling started on Sunday no pian or injury. App has been made with provider in our office Thursday. If gets worse or new symptoms will go to walk in or ED

## 2017-09-14 ENCOUNTER — Ambulatory Visit (INDEPENDENT_AMBULATORY_CARE_PROVIDER_SITE_OTHER): Payer: Managed Care, Other (non HMO) | Admitting: Family Medicine

## 2017-09-14 ENCOUNTER — Encounter: Payer: Self-pay | Admitting: Family Medicine

## 2017-09-14 VITALS — BP 152/94 | HR 73 | Temp 98.0°F | Wt 187.4 lb

## 2017-09-14 DIAGNOSIS — M2550 Pain in unspecified joint: Secondary | ICD-10-CM | POA: Diagnosis not present

## 2017-09-14 DIAGNOSIS — J019 Acute sinusitis, unspecified: Secondary | ICD-10-CM

## 2017-09-14 DIAGNOSIS — S80861D Insect bite (nonvenomous), right lower leg, subsequent encounter: Secondary | ICD-10-CM

## 2017-09-14 DIAGNOSIS — R5383 Other fatigue: Secondary | ICD-10-CM | POA: Diagnosis not present

## 2017-09-14 DIAGNOSIS — W57XXXD Bitten or stung by nonvenomous insect and other nonvenomous arthropods, subsequent encounter: Secondary | ICD-10-CM | POA: Diagnosis not present

## 2017-09-14 LAB — COMPREHENSIVE METABOLIC PANEL
ALBUMIN: 4.3 g/dL (ref 3.5–5.2)
ALK PHOS: 68 U/L (ref 39–117)
ALT: 14 U/L (ref 0–35)
AST: 14 U/L (ref 0–37)
BILIRUBIN TOTAL: 0.8 mg/dL (ref 0.2–1.2)
BUN: 12 mg/dL (ref 6–23)
CO2: 28 mEq/L (ref 19–32)
Calcium: 10 mg/dL (ref 8.4–10.5)
Chloride: 103 mEq/L (ref 96–112)
Creatinine, Ser: 1.03 mg/dL (ref 0.40–1.20)
GFR: 57.13 mL/min — ABNORMAL LOW (ref >60.00)
GLUCOSE: 85 mg/dL (ref 70–99)
POTASSIUM: 4 meq/L (ref 3.5–5.1)
SODIUM: 140 meq/L (ref 135–145)
TOTAL PROTEIN: 7.7 g/dL (ref 6.0–8.3)

## 2017-09-14 LAB — CBC
HEMATOCRIT: 41.3 % (ref 36.0–46.0)
Hemoglobin: 13.9 g/dL (ref 12.0–15.0)
MCHC: 33.7 g/dL (ref 30.0–36.0)
MCV: 90.3 fl (ref 78.0–100.0)
PLATELETS: 388 10*3/uL (ref 150.0–400.0)
RBC: 4.57 Mil/uL (ref 3.87–5.11)
RDW: 13 % (ref 11.5–15.5)
WBC: 7.6 10*3/uL (ref 4.0–10.5)

## 2017-09-14 LAB — SEDIMENTATION RATE: SED RATE: 10 mm/h (ref 0–30)

## 2017-09-14 LAB — TSH: TSH: 1.17 u[IU]/mL (ref 0.35–4.50)

## 2017-09-14 MED ORDER — AMOXICILLIN-POT CLAVULANATE 875-125 MG PO TABS: 1.0000 | ORAL_TABLET | Freq: Two times a day (BID) | ORAL | 0 refills | Status: DC

## 2017-09-14 NOTE — Assessment & Plan Note (Signed)
Suspect sinusitis is leading to her congestion. We will trial Augmentin to provide better coverage. If not improving with this she'll let us know.

## 2017-09-14 NOTE — Assessment & Plan Note (Signed)
Patient with recent arthralgias. Suspect related to osteoarthritis given nodule in left thumb interphalangeal joint. Doubt inflammatory process though we will check a sedimentation rate. She is worried that it could be related to Lyme disease given she had a tick bite last year. This would seem unlikely given our location though she has had some tiredness and these joint aches and thus we will check Lyme titers. We'll also check a TSH and a CBC and a CMP. She can continue ibuprofen for this. She should try to minimize this we'll monitor her renal function today.

## 2017-09-14 NOTE — Progress Notes (Signed)
Tommi Rumps, MD Phone: [phone]  Jill Torres is a 66 y.o. female who presents today for same-day visit.  Patient notes for the last week or so she's had some joint aches. Notes her left thumb interphalangeal joint was swollen and uncomfortable and her left middle finger also became a little swollen and uncomfortable. Notes the pain has gone away though there is still a nodule in the left thumb and slight swelling to the middle finger. Notes it has improved some. Notes slight discomfort at bilateral elbows medially. No swelling in her arms. She notes chronic neck pain that might be a little worse than usual. She did carry a case of water and thinks that may have exacerbated things. No numbness or weakness. No radiation. She is concerned it may be related to tick bite she had over a year ago. She's concerned about Lyme disease particularly given that she has been tired over the last year. She notes no chest pain or shortness of breath.  Patient was previously treated for sinusitis with amoxicillin. She notes this did help while she was on it though pretty quickly after coming off of this she had recurrence of her left maxillary sinus pressure. She's been taking Mucinex and Robitussin. Also Zyrtec. Notes quite a bit of maxillary sinus congestion on the left. Postnasal drip and blowing mucus out of her nose.  PMH: nonsmoker.   ROS see history of present illness  Objective  Physical Exam Vitals:   09/14/17 1116 09/14/17 1152  BP: (!) 160/90 (!) 152/94  Pulse: 73   Temp: 98 F (36.7 C)   SpO2: 99%     BP Readings from Last 3 Encounters:  09/14/17 (!) 152/94  07/24/17 134/82  05/01/17 122/78   Wt Readings from Last 3 Encounters:  09/14/17 187 lb 6.4 oz (85 kg)  07/24/17 185 lb 3.2 oz (84 kg)  04/19/17 183 lb (83 kg)    Physical Exam  Constitutional: No distress.  HENT:  Head: Normocephalic and atraumatic.  Mouth/Throat: Oropharynx is clear and moist. No oropharyngeal  exudate.  Eyes: Pupils are equal, round, and reactive to light.  Cardiovascular: Normal rate, regular rhythm and normal heart sounds.   Pulmonary/Chest: Effort normal and breath sounds normal.  Musculoskeletal:  Left thumb interphalangeal joint with nodule along the radial aspect, no tenderness, no swelling of the thumb, left middle finger with slight swelling over the middle phalanx, no tenderness, no warmth or erythema, bilateral elbows medially slightly tender, no swelling, no palpable underlying defects, no swelling elsewhere in her arms, 2+ radial pulses, hands are warm and well-perfused, 5/5 strength bilateral biceps, triceps, grip, quads, hamstrings, and plantar flexion, sensation to light touch intact in bilateral upper and lower extremities, no midline spine tenderness, no midline spine step-off, there is muscular neck tenderness laterally in the trapezius muscles  Neurological: She is alert. Gait normal.  Skin: Skin is warm and dry. She is not diaphoretic.     Assessment/Plan: Please see individual problem list.  Arthralgia Patient with recent arthralgias. Suspect related to osteoarthritis given nodule in left thumb interphalangeal joint. Doubt inflammatory process though we will check a sedimentation rate. She is worried that it could be related to Lyme disease given she had a tick bite last year. This would seem unlikely given our location though she has had some tiredness and these joint aches and thus we will check Lyme titers. We'll also check a TSH and a CBC and a CMP. She can continue ibuprofen for this. She  should try to minimize this we'll monitor her renal function today.  Sinusitis Suspect sinusitis is leading to her congestion. We will trial Augmentin to provide better coverage. If not improving with this she'll let us know.   Orders Placed This Encounter  Procedures  . B. burgdorfi antibodies  . TSH  . CBC  . Sedimentation rate  . Comp Met (CMET)    Meds ordered  this encounter  Medications  . amoxicillin-clavulanate (AUGMENTIN) 875-125 MG tablet    Sig: Take 1 tablet by mouth 2 (two) times daily.    Dispense:  14 tablet    Refill:  0    Tommi Rumps, MD [place]

## 2017-09-14 NOTE — Patient Instructions (Addendum)
Nice to meet you. I suspect your discomfort is related to arthritis. You can continue ibuprofen 600 mg every 8 hours as needed for this. If you require this on a daily basis we will need to recheck kidney function. We will treat you for a sinus infection with Augmentin. We'll check some lab work to evaluate your tiredness. If you have any new or worsening symptoms please be evaluated.

## 2017-09-15 ENCOUNTER — Other Ambulatory Visit: Payer: Self-pay | Admitting: Internal Medicine

## 2017-09-15 DIAGNOSIS — Z1231 Encounter for screening mammogram for malignant neoplasm of breast: Secondary | ICD-10-CM

## 2017-09-15 LAB — B. BURGDORFI ANTIBODIES

## 2017-10-23 ENCOUNTER — Ambulatory Visit
Admission: RE | Admit: 2017-10-23 | Discharge: 2017-10-23 | Disposition: A | Discharge status: Home / Self Care | Payer: Managed Care, Other (non HMO) | Source: Ambulatory Visit | Attending: Internal Medicine | Admitting: Internal Medicine

## 2017-10-23 DIAGNOSIS — Z1231 Encounter for screening mammogram for malignant neoplasm of breast: Secondary | ICD-10-CM

## 2017-11-07 ENCOUNTER — Other Ambulatory Visit: Payer: Self-pay | Admitting: Internal Medicine

## 2017-11-08 ENCOUNTER — Encounter: Payer: Self-pay | Admitting: Internal Medicine

## 2017-11-08 NOTE — Telephone Encounter (Signed)
If having brown urine and back and abdominal pain, she needs to be evaluated.  With the two scheduled appts she has tomorrow, would have to see where can be worked in.  She may need to go to acute care.

## 2017-11-28 ENCOUNTER — Other Ambulatory Visit (INDEPENDENT_AMBULATORY_CARE_PROVIDER_SITE_OTHER): Payer: Managed Care, Other (non HMO)

## 2017-11-28 ENCOUNTER — Encounter: Payer: Self-pay | Admitting: Internal Medicine

## 2017-11-28 DIAGNOSIS — I10 Essential (primary) hypertension: Secondary | ICD-10-CM

## 2017-11-28 DIAGNOSIS — E7800 Pure hypercholesterolemia, unspecified: Secondary | ICD-10-CM | POA: Diagnosis not present

## 2017-11-28 LAB — BASIC METABOLIC PANEL
BUN: 11 mg/dL (ref 6–23)
CHLORIDE: 104 meq/L (ref 96–112)
CO2: 28 meq/L (ref 19–32)
CREATININE: 1 mg/dL (ref 0.40–1.20)
Calcium: 9.4 mg/dL (ref 8.4–10.5)
GFR: 59.08 mL/min — ABNORMAL LOW (ref >60.00)
GLUCOSE: 89 mg/dL (ref 70–99)
POTASSIUM: 4.3 meq/L (ref 3.5–5.1)
Sodium: 140 mEq/L (ref 135–145)

## 2017-11-28 LAB — CBC WITH DIFFERENTIAL/PLATELET
Basophils Absolute: 0.1 10*3/uL (ref 0.0–0.1)
Basophils Relative: 1.3 % (ref 0.0–3.0)
EOS PCT: 1.3 % (ref 0.0–5.0)
Eosinophils Absolute: 0.1 10*3/uL (ref 0.0–0.7)
HEMATOCRIT: 41.4 % (ref 36.0–46.0)
HEMOGLOBIN: 13.7 g/dL (ref 12.0–15.0)
LYMPHS ABS: 2.3 10*3/uL (ref 0.7–4.0)
LYMPHS PCT: 28.9 % (ref 12.0–46.0)
MCHC: 33.1 g/dL (ref 30.0–36.0)
MCV: 91.1 fl (ref 78.0–100.0)
MONOS PCT: 6.9 % (ref 3.0–12.0)
Monocytes Absolute: 0.5 10*3/uL (ref 0.1–1.0)
Neutro Abs: 4.8 10*3/uL (ref 1.4–7.7)
Neutrophils Relative %: 61.6 % (ref 43.0–77.0)
Platelets: 370 10*3/uL (ref 150.0–400.0)
RBC: 4.54 Mil/uL (ref 3.87–5.11)
RDW: 12.9 % (ref 11.5–15.5)
WBC: 7.8 10*3/uL (ref 4.0–10.5)

## 2017-11-28 LAB — HEPATIC FUNCTION PANEL
ALBUMIN: 4.1 g/dL (ref 3.5–5.2)
ALK PHOS: 62 U/L (ref 39–117)
ALT: 13 U/L (ref 0–35)
AST: 14 U/L (ref 0–37)
Bilirubin, Direct: 0.1 mg/dL (ref 0.0–0.3)
Total Bilirubin: 0.8 mg/dL (ref 0.2–1.2)
Total Protein: 7.3 g/dL (ref 6.0–8.3)

## 2017-11-28 LAB — LIPID PANEL
CHOL/HDL RATIO: 3
Cholesterol: 153 mg/dL (ref 0–200)
HDL: 56 mg/dL (ref >39.00)
LDL CALC: 67 mg/dL (ref 0–99)
NONHDL: 97.01
Triglycerides: 152 mg/dL — ABNORMAL HIGH (ref 0.0–149.0)
VLDL: 30.4 mg/dL (ref 0.0–40.0)

## 2017-11-28 LAB — TSH: TSH: 1.05 u[IU]/mL (ref 0.35–4.50)

## 2017-11-30 ENCOUNTER — Encounter: Payer: Self-pay | Admitting: Internal Medicine

## 2017-11-30 ENCOUNTER — Ambulatory Visit (INDEPENDENT_AMBULATORY_CARE_PROVIDER_SITE_OTHER): Payer: Managed Care, Other (non HMO) | Admitting: Internal Medicine

## 2017-11-30 VITALS — BP 134/84 | HR 75 | Temp 98.3°F | Ht 63.5 in | Wt 187.0 lb

## 2017-11-30 DIAGNOSIS — I10 Essential (primary) hypertension: Secondary | ICD-10-CM

## 2017-11-30 DIAGNOSIS — F419 Anxiety disorder, unspecified: Secondary | ICD-10-CM | POA: Diagnosis not present

## 2017-11-30 DIAGNOSIS — Z0000 Encounter for general adult medical examination without abnormal findings: Secondary | ICD-10-CM | POA: Diagnosis not present

## 2017-11-30 DIAGNOSIS — J019 Acute sinusitis, unspecified: Secondary | ICD-10-CM | POA: Diagnosis not present

## 2017-11-30 DIAGNOSIS — E7800 Pure hypercholesterolemia, unspecified: Secondary | ICD-10-CM

## 2017-11-30 MED ORDER — AMOXICILLIN-POT CLAVULANATE 875-125 MG PO TABS: 1.0000 | ORAL_TABLET | Freq: Two times a day (BID) | ORAL | 0 refills | Status: DC

## 2017-11-30 NOTE — Progress Notes (Signed)
Patient ID: BEVAN VU, female   DOB: February 05, 1951, 66 y.o.   MRN: [id number]   Subjective:    Patient ID: [name], female    DOB: 06/03/1951, 66 y.o.   MRN: [id number]  HPI  Patient here for her physical exam.  Was recently evaluated for joint pain.  See Dr Ellen Henri note.  Reports pain in her thumb is better.  Still some discomfort.  No increased erythema or warmth.  Was recently evaluated by Dr Matilde Sprang.  Was given Tobradex for some eye drainage.  Also reports increased sinus pressure and congestion.  Symptoms started over a week ago.  Increased sinus pressure and bloody mucus. Increased post nasal drainage.  Some cough.  Previous sore throat.  No chest congestion.  Some cough.  Using flonase, astelin and mucinex.  No improvement.  Discussed if continuing recurrent infection, will need reevaluation by ENT.     Past Medical History:  Diagnosis Date  . Allergy   . Arthritis   . Depression   . Diverticulosis of colon (without mention of hemorrhage)   . GERD (gastroesophageal reflux disease)   . History of chicken pox   . History of shingles may 2013  . HTN (hypertension)   . Irritable bowel syndrome   . Unspecified hypothyroidism    Past Surgical History:  Procedure Laterality Date  . ABDOMINAL HYSTERECTOMY  1983   due to heavy bleeding & ovary cyst  . APPENDECTOMY  1974  . BLADDER DIVERTICULECTOMY  1990  . CESAREAN SECTION     x 2  . CHOLECYSTECTOMY     x 2  . DILATION AND CURETTAGE OF UTERUS     x 2  . DIRECT LARYNGOSCOPY Right 06/11/2015   Procedure: suspension microdirect laryngoscopy with biopsy of right tongue base;  Surgeon: Carloyn Manner, MD;  Location: [facility] ORS;  Service: ENT;  Laterality: Right;  . NASAL SEPTUM SURGERY  1999  . PARTIAL HYSTERECTOMY  1983   cyst on ovary and heavy bleeding    Family History  Problem Relation Age of Onset  . Arthritis Mother   . Hypertension Mother   . Hyperlipidemia Mother   . Atrial fibrillation Mother   . Heart  disease Mother   . Diabetes Mother   . Hyperlipidemia Father   . Heart disease Father        s/p CABG  . Stroke Father   . Rheumatic fever Sister        s/p valve replacement  . Deep vein thrombosis Brother        after immobilization   . Breast cancer Neg Hx    Social History   Socioeconomic History  . Marital status: Single    Spouse name: None  . Number of children: 2  . Years of education: None  . Highest education level: None  Social Needs  . Financial resource strain: None  . Food insecurity - worry: None  . Food insecurity - inability: None  . Transportation needs - medical: None  . Transportation needs - non-medical: None  Occupational History  . Occupation: Sales    Comment: [employer] products   Tobacco Use  . Smoking status: Never Smoker  . Smokeless tobacco: Never Used  Substance and Sexual Activity  . Alcohol use: No    Alcohol/week: 0.0 oz  . Drug use: No  . Sexual activity: None  Other Topics Concern  . None  Social History Narrative   No regular exercise   Lives in noisy  environment- very stressful    Daily caffeine use     Outpatient Encounter Medications as of 11/30/2017  Medication Sig  . amLODipine (NORVASC) 2.5 MG tablet Take 1 tablet (2.5 mg total) by mouth daily.  Marland Kitchen azelastine (ASTELIN) 0.1 % nasal spray Place 1 spray into both nostrils 2 (two) times daily. Use in each nostril as directed  . bifidobacterium infantis (ALIGN) capsule Take 1 capsule by mouth daily.    . calcium-vitamin D (OSCAL WITH D 500-200) 500-200 MG-UNIT per tablet Take 1 tablet by mouth daily.    . cetirizine (ZYRTEC) 10 MG chewable tablet Chew 10 mg by mouth daily.   . Coenzyme Q10 (COQ10 PO) Take by mouth daily.  . fish oil-omega-3 fatty acids 1000 MG capsule Take 2 g by mouth daily.  Marland Kitchen FLUoxetine (PROZAC) 10 MG capsule TAKE 1 CAPSULE (10 MG TOTAL) BY MOUTH DAILY.  Marland Kitchen Fluticasone Furoate (FLONASE SENSIMIST NA) Place into the nose.  . ibuprofen (ADVIL,MOTRIN) 200  MG tablet Take 200 mg by mouth as directed.    . multivitamin (THERAGRAN) per tablet Take 1 tablet by mouth daily.    . polyethylene glycol (MIRALAX) powder Take 17 g by mouth as needed.   . pravastatin (PRAVACHOL) 40 MG tablet TAKE 1 TABLET(40 MG) BY MOUTH DAILY  . amoxicillin-clavulanate (AUGMENTIN) 875-125 MG tablet Take 1 tablet by mouth 2 (two) times daily.  . [DISCONTINUED] amoxicillin-clavulanate (AUGMENTIN) 875-125 MG tablet Take 1 tablet by mouth 2 (two) times daily.   No facility-administered encounter medications on file as of 11/30/2017.     Review of Systems  Constitutional: Negative for appetite change and unexpected weight change.  HENT: Positive for congestion, postnasal drip and sinus pressure. Negative for sore throat.        Previous sore throat.  Resolved.    Eyes: Negative for pain and visual disturbance.  Respiratory: Positive for cough. Negative for chest tightness and shortness of breath.   Cardiovascular: Negative for chest pain, palpitations and leg swelling.  Gastrointestinal: Negative for abdominal pain, diarrhea, nausea and vomiting.  Genitourinary: Negative for difficulty urinating and dysuria.  Musculoskeletal: Negative for back pain and joint swelling.       Thumb pain as outlined.    Skin: Negative for color change and rash.  Neurological: Negative for dizziness, light-headedness and headaches.  Hematological: Negative for adenopathy. Does not bruise/bleed easily.  Psychiatric/Behavioral: Negative for agitation and dysphoric mood.       Objective:    Physical Exam  Constitutional: She is oriented to person, place, and time. She appears well-developed and well-nourished. No distress.  HENT:  Mouth/Throat: Oropharynx is clear and moist.  Increased sinus tenderness to palpation.  Nares:  Slightly erythematous turbinates.  TMs without erythema.    Eyes: Right eye exhibits no discharge. Left eye exhibits no discharge. No scleral icterus.  Neck: Neck  supple. No thyromegaly present.  Cardiovascular: Normal rate and regular rhythm.  Pulmonary/Chest: Breath sounds normal. No accessory muscle usage. No tachypnea. No respiratory distress. She has no decreased breath sounds. She has no wheezes. She has no rhonchi. Right breast exhibits no inverted nipple, no mass, no nipple discharge and no tenderness (no axillary adenopathy). Left breast exhibits no inverted nipple, no mass, no nipple discharge and no tenderness (no axilarry adenopathy).  Abdominal: Soft. Bowel sounds are normal. There is no tenderness.  Musculoskeletal: She exhibits no edema or tenderness.  Lymphadenopathy:    She has no cervical adenopathy.  Neurological: She is alert and oriented to  person, place, and time.  Skin: Skin is warm. No rash noted. No erythema.  Psychiatric: She has a normal mood and affect. Her behavior is normal.    BP 134/84 (BP Location: Right Arm, Patient Position: Sitting, Cuff Size: Large)   Pulse 75   Temp 98.3 F (36.8 C) (Oral)   Ht 5' 3.5" (1.613 m)   Wt 187 lb (84.8 kg)   LMP 12/12/1981   SpO2 97%   BMI 32.61 kg/m  Wt Readings from Last 3 Encounters:  11/30/17 187 lb (84.8 kg)  09/14/17 187 lb 6.4 oz (85 kg)  07/24/17 185 lb 3.2 oz (84 kg)     Lab Results  Component Value Date   WBC 7.8 11/28/2017   HGB 13.7 11/28/2017   HCT 41.4 11/28/2017   PLT 370.0 11/28/2017   GLUCOSE 89 11/28/2017   CHOL 153 11/28/2017   TRIG 152.0 (H) 11/28/2017   HDL 56.00 11/28/2017   LDLDIRECT 155.8 06/27/2013   LDLCALC 67 11/28/2017   ALT 13 11/28/2017   AST 14 11/28/2017   NA 140 11/28/2017   K 4.3 11/28/2017   CL 104 11/28/2017   CREATININE 1.00 11/28/2017   BUN 11 11/28/2017   CO2 28 11/28/2017   TSH 1.05 11/28/2017    Mm Screening Breast Tomo Bilateral  Result Date: 10/23/2017 CLINICAL DATA:  Screening. EXAM: 2D DIGITAL SCREENING BILATERAL MAMMOGRAM WITH CAD AND ADJUNCT TOMO COMPARISON:  Previous exam(s). ACR Breast Density Category b:  There are scattered areas of fibroglandular density. FINDINGS: There are no findings suspicious for malignancy. Images were processed with CAD. IMPRESSION: No mammographic evidence of malignancy. A result letter of this screening mammogram will be mailed directly to the patient. RECOMMENDATION: Screening mammogram in one year. (Code:SM-B-01Y) BI-RADS CATEGORY  1: Negative. Electronically Signed   By: Franki Cabot M.D.   On: 10/23/2017 16:52       Assessment & Plan:   Problem List Items Addressed This Visit    Anxiety    On prozac.  Stable.        Health care maintenance    Physical today 11/30/17.  Mammogram 10/23/17 - birads I.  Colonoscopy 02/27/09 - hyperplastic polyp.  Recommended f/u in 10 years.        HTN (hypertension)    Blood pressure under good control.  Continue same medication regimen.  Follow pressures.  Follow metabolic panel.        Relevant Orders   Basic metabolic panel   Hypercholesterolemia    On pravastatin.  Low cholesterol diet and exercise.  Follow lipid panel and liver function tests.  LDL 61.        Relevant Orders   Hepatic function panel   Lipid panel   Sinusitis    Symptoms c/w sinusitis.  Continue mucinex, astelin, flonase.  Treat with augmentin as directed.   Take probiotic while on abx and for two weeks after completing abx.  If persistent recurrent infections, will need ENT evaluation.        Relevant Medications   amoxicillin-clavulanate (AUGMENTIN) 875-125 MG tablet    Other Visit Diagnoses    Routine general medical examination at a health care facility    -  Primary       [name], MD

## 2017-11-30 NOTE — Assessment & Plan Note (Signed)
Physical today 11/30/17.  Mammogram 10/23/17 - birads I.  Colonoscopy 02/27/09 - hyperplastic polyp.  Recommended f/u in 10 years.

## 2017-12-03 ENCOUNTER — Other Ambulatory Visit: Payer: Self-pay | Admitting: Family

## 2017-12-03 ENCOUNTER — Encounter: Payer: Self-pay | Admitting: Internal Medicine

## 2017-12-03 DIAGNOSIS — R030 Elevated blood-pressure reading, without diagnosis of hypertension: Secondary | ICD-10-CM

## 2017-12-03 NOTE — Assessment & Plan Note (Addendum)
On prozac.  Stable.   

## 2017-12-03 NOTE — Assessment & Plan Note (Signed)
Symptoms c/w sinusitis.  Continue mucinex, astelin, flonase.  Treat with augmentin as directed.   Take probiotic while on abx and for two weeks after completing abx.  If persistent recurrent infections, will need ENT evaluation.

## 2017-12-03 NOTE — Assessment & Plan Note (Signed)
On pravastatin.  Low cholesterol diet and exercise.  Follow lipid panel and liver function tests.  LDL 61.

## 2017-12-03 NOTE — Assessment & Plan Note (Signed)
Blood pressure under good control.  Continue same medication regimen.  Follow pressures.  Follow metabolic panel.   

## 2017-12-28 ENCOUNTER — Encounter: Payer: Self-pay | Admitting: Internal Medicine

## 2017-12-29 MED ORDER — PRAVASTATIN SODIUM 40 MG PO TABS: ORAL_TABLET | ORAL | 0 refills | Status: DC

## 2017-12-29 NOTE — Telephone Encounter (Signed)
Medication has been refilled. Please advise.

## 2017-12-31 MED ORDER — PRAVASTATIN SODIUM 40 MG PO TABS: ORAL_TABLET | ORAL | 3 refills | Status: DC

## 2017-12-31 NOTE — Telephone Encounter (Signed)
rx sent in for pravastatin.  (initial rx from [facility] sent in for 30 days with no refills).  I sent in rx for pravastatin #30 with 3 refills.  See other my chart - for response to medicare wellness visit.

## 2018-01-25 ENCOUNTER — Encounter: Payer: Self-pay | Admitting: Internal Medicine

## 2018-01-31 ENCOUNTER — Encounter: Payer: Self-pay | Admitting: Family Medicine

## 2018-01-31 ENCOUNTER — Ambulatory Visit (INDEPENDENT_AMBULATORY_CARE_PROVIDER_SITE_OTHER): Payer: Medicare HMO | Admitting: Family Medicine

## 2018-01-31 ENCOUNTER — Other Ambulatory Visit: Payer: Self-pay

## 2018-01-31 VITALS — BP 122/60 | HR 109 | Temp 99.5°F | Wt 186.6 lb

## 2018-01-31 DIAGNOSIS — R05 Cough: Secondary | ICD-10-CM

## 2018-01-31 DIAGNOSIS — B349 Viral infection, unspecified: Secondary | ICD-10-CM

## 2018-01-31 DIAGNOSIS — J029 Acute pharyngitis, unspecified: Secondary | ICD-10-CM

## 2018-01-31 DIAGNOSIS — R059 Cough, unspecified: Secondary | ICD-10-CM

## 2018-01-31 LAB — POCT RAPID STREP A (OFFICE): RAPID STREP A SCREEN: NEGATIVE

## 2018-01-31 LAB — POCT INFLUENZA A/B
INFLUENZA A, POC: NEGATIVE
INFLUENZA B, POC: NEGATIVE

## 2018-01-31 MED ORDER — OSELTAMIVIR PHOSPHATE 75 MG PO CAPS: 75.0000 mg | ORAL_CAPSULE | Freq: Two times a day (BID) | ORAL | 0 refills | Status: DC

## 2018-01-31 NOTE — Patient Instructions (Signed)
Nice to see you. Please proceed with treatment for the flu. If you develop worsening symptoms, cough productive of blood, or any additional symptoms please be evaluated.

## 2018-01-31 NOTE — Progress Notes (Signed)
  Tommi Rumps, MD Phone: [phone]  Jill Torres is a 67 y.o. female who presents today for same-day visit.  Patient notes 4 days of symptoms.  She has had cough, nasal congestion, sneezing, sore throat, and chills.  Notes her ears feel like they are draining.  She has had no fevers.  Clear nasal discharge.  She has been using Flonase and Zyrtec as well as Tylenol.  Positive flu contacts in the house.  She has been staying well-hydrated.  Social History   Tobacco Use  Smoking Status Never Smoker  Smokeless Tobacco Never Used     ROS see history of present illness  Objective  Physical Exam Vitals:   01/31/18 1025  BP: 122/60  Pulse: (!) 109  Temp: 99.5 F (37.5 C)  SpO2: 97%    BP Readings from Last 3 Encounters:  01/31/18 122/60  11/30/17 134/84  09/14/17 (!) 152/94   Wt Readings from Last 3 Encounters:  01/31/18 186 lb 9.6 oz (84.6 kg)  11/30/17 187 lb (84.8 kg)  09/14/17 187 lb 6.4 oz (85 kg)    Physical Exam  Constitutional: No distress.  HENT:  Head: Normocephalic and atraumatic.  Mouth/Throat: No oropharyngeal exudate.  Mild posterior oropharyngeal erythema  Eyes: Conjunctivae are normal. Pupils are equal, round, and reactive to light.  Neck: Neck supple.  Cardiovascular: Regular rhythm and normal heart sounds. Tachycardia present.  Pulmonary/Chest: Effort normal and breath sounds normal.  Musculoskeletal: She exhibits no edema.  Lymphadenopathy:    She has no cervical adenopathy.  Neurological: She is alert. Gait normal.  Skin: Skin is warm and dry. She is not diaphoretic.     Assessment/Plan: Please see individual problem list.  Viral illness Patient with likely viral illness that could be the flu given her positive flu contacts.  Rapid flu test was negative today though given her prior exposure and age we discussed proceeding with Tamiflu for treatment.  I did discuss small risk of psychosis with Tamiflu.  Discussed hydration.  She  voiced understanding.  She is given return precautions.   Orders Placed This Encounter  Procedures  . POCT Influenza A/B  . POCT rapid strep A    Meds ordered this encounter  Medications  . oseltamivir (TAMIFLU) 75 MG capsule    Sig: Take 1 capsule (75 mg total) by mouth 2 (two) times daily.    Dispense:  10 capsule    Refill:  0     Tommi Rumps, MD [place]

## 2018-01-31 NOTE — Assessment & Plan Note (Addendum)
Patient with likely viral illness that could be the flu given her positive flu contacts.  Rapid flu test was negative today though given her prior exposure and age we discussed proceeding with Tamiflu for treatment.  I did discuss small risk of psychosis with Tamiflu.  Discussed hydration.  She voiced understanding.  She is given return precautions.

## 2018-02-12 ENCOUNTER — Other Ambulatory Visit: Payer: Self-pay | Admitting: Internal Medicine

## 2018-02-12 DIAGNOSIS — R69 Illness, unspecified: Secondary | ICD-10-CM | POA: Diagnosis not present

## 2018-02-12 DIAGNOSIS — F419 Anxiety disorder, unspecified: Secondary | ICD-10-CM

## 2018-04-09 ENCOUNTER — Other Ambulatory Visit (INDEPENDENT_AMBULATORY_CARE_PROVIDER_SITE_OTHER): Payer: Medicare HMO

## 2018-04-09 ENCOUNTER — Encounter: Payer: Self-pay | Admitting: Internal Medicine

## 2018-04-09 DIAGNOSIS — E7800 Pure hypercholesterolemia, unspecified: Secondary | ICD-10-CM | POA: Diagnosis not present

## 2018-04-09 DIAGNOSIS — I10 Essential (primary) hypertension: Secondary | ICD-10-CM | POA: Diagnosis not present

## 2018-04-09 LAB — BASIC METABOLIC PANEL
BUN: 11 mg/dL (ref 6–23)
CHLORIDE: 105 meq/L (ref 96–112)
CO2: 27 mEq/L (ref 19–32)
Calcium: 9.4 mg/dL (ref 8.4–10.5)
Creatinine, Ser: 1.11 mg/dL (ref 0.40–1.20)
GFR: 52.31 mL/min — AB (ref >60.00)
Glucose, Bld: 92 mg/dL (ref 70–99)
POTASSIUM: 3.8 meq/L (ref 3.5–5.1)
SODIUM: 140 meq/L (ref 135–145)

## 2018-04-09 LAB — LIPID PANEL
CHOLESTEROL: 174 mg/dL (ref 0–200)
HDL: 57.2 mg/dL (ref >39.00)
LDL CALC: 87 mg/dL (ref 0–99)
NonHDL: 116.77
Total CHOL/HDL Ratio: 3
Triglycerides: 149 mg/dL (ref 0.0–149.0)
VLDL: 29.8 mg/dL (ref 0.0–40.0)

## 2018-04-09 LAB — HEPATIC FUNCTION PANEL
ALT: 13 U/L (ref 0–35)
AST: 13 U/L (ref 0–37)
Albumin: 4 g/dL (ref 3.5–5.2)
Alkaline Phosphatase: 65 U/L (ref 39–117)
BILIRUBIN TOTAL: 0.7 mg/dL (ref 0.2–1.2)
Bilirubin, Direct: 0.1 mg/dL (ref 0.0–0.3)
Total Protein: 7.1 g/dL (ref 6.0–8.3)

## 2018-04-11 ENCOUNTER — Ambulatory Visit (INDEPENDENT_AMBULATORY_CARE_PROVIDER_SITE_OTHER): Payer: Medicare HMO | Admitting: Internal Medicine

## 2018-04-11 VITALS — BP 140/88 | HR 76 | Temp 98.4°F | Resp 16 | Wt 178.6 lb

## 2018-04-11 DIAGNOSIS — I10 Essential (primary) hypertension: Secondary | ICD-10-CM | POA: Diagnosis not present

## 2018-04-11 DIAGNOSIS — R69 Illness, unspecified: Secondary | ICD-10-CM | POA: Diagnosis not present

## 2018-04-11 DIAGNOSIS — F419 Anxiety disorder, unspecified: Secondary | ICD-10-CM | POA: Diagnosis not present

## 2018-04-11 DIAGNOSIS — Z23 Encounter for immunization: Secondary | ICD-10-CM | POA: Diagnosis not present

## 2018-04-11 DIAGNOSIS — Z9109 Other allergy status, other than to drugs and biological substances: Secondary | ICD-10-CM | POA: Diagnosis not present

## 2018-04-11 DIAGNOSIS — E7800 Pure hypercholesterolemia, unspecified: Secondary | ICD-10-CM

## 2018-04-11 MED ORDER — NEOMYCIN-POLYMYXIN-HC 3.5-10000-1 OT SOLN: 3.0000 [drp] | Freq: Three times a day (TID) | OTIC | 0 refills | Status: DC | PRN

## 2018-04-11 MED ORDER — AZELASTINE HCL 0.1 % NA SOLN: 1.0000 | Freq: Two times a day (BID) | NASAL | 1 refills | Status: DC

## 2018-04-11 NOTE — Progress Notes (Signed)
Patient ID: Jill Torres, female   DOB: 02-01-1951, 67 y.o.   MRN: [id number]   Subjective:    Patient ID: Jill Torres, female    DOB: 02/26/1951, 67 y.o.   MRN: [id number]  HPI  Patient here for a scheduled follow up.  She reports she is doing relatively well.  Retired.  Decreased stress.  Trying to stay active.  No chest pain.  No sob.  No acid reflux.  No abdominal pain.  Bowels moving.  Blood pressures on outside checks averaging 120s/70s.     Past Medical History:  Diagnosis Date  . Allergy   . Arthritis   . Depression   . Diverticulosis of colon (without mention of hemorrhage)   . GERD (gastroesophageal reflux disease)   . History of chicken pox   . History of shingles may 2013  . HTN (hypertension)   . Irritable bowel syndrome   . Unspecified hypothyroidism    Past Surgical History:  Procedure Laterality Date  . ABDOMINAL HYSTERECTOMY  1983   due to heavy bleeding & ovary cyst  . APPENDECTOMY  1974  . BLADDER DIVERTICULECTOMY  1990  . CESAREAN SECTION     x 2  . CHOLECYSTECTOMY     x 2  . DILATION AND CURETTAGE OF UTERUS     x 2  . DIRECT LARYNGOSCOPY Right 06/11/2015   Procedure: suspension microdirect laryngoscopy with biopsy of right tongue base;  Surgeon: Carloyn Manner, MD;  Location: [facility] ORS;  Service: ENT;  Laterality: Right;  . NASAL SEPTUM SURGERY  1999  . PARTIAL HYSTERECTOMY  1983   cyst on ovary and heavy bleeding    Family History  Problem Relation Age of Onset  . Arthritis Mother   . Hypertension Mother   . Hyperlipidemia Mother   . Atrial fibrillation Mother   . Heart disease Mother   . Diabetes Mother   . Hyperlipidemia Father   . Heart disease Father        s/p CABG  . Stroke Father   . Rheumatic fever Sister        s/p valve replacement  . Deep vein thrombosis Brother        after immobilization   . Breast cancer Neg Hx    Social History   Socioeconomic History  . Marital status: Single    Spouse name: Not on file  .  Number of children: 2  . Years of education: Not on file  . Highest education level: Not on file  Occupational History  . Occupation: Press photographer    Comment: Chief of Staff   Social Needs  . Financial resource strain: Not on file  . Food insecurity:    Worry: Not on file    Inability: Not on file  . Transportation needs:    Medical: Not on file    Non-medical: Not on file  Tobacco Use  . Smoking status: Never Smoker  . Smokeless tobacco: Never Used  Substance and Sexual Activity  . Alcohol use: No    Alcohol/week: 0.0 oz  . Drug use: No  . Sexual activity: Not on file  Lifestyle  . Physical activity:    Days per week: Not on file    Minutes per session: Not on file  . Stress: Not on file  Relationships  . Social connections:    Talks on phone: Not on file    Gets together: Not on file    Attends religious service: Not on file  Active member of club or organization: Not on file    Attends meetings of clubs or organizations: Not on file    Relationship status: Not on file  Other Topics Concern  . Not on file  Social History Narrative   No regular exercise   Lives in noisy environment- very stressful    Daily caffeine use     Outpatient Encounter Medications as of 04/11/2018  Medication Sig  . amLODipine (NORVASC) 2.5 MG tablet TAKE 1 TABLET(2.5 MG) BY MOUTH DAILY  . azelastine (ASTELIN) 0.1 % nasal spray Place 1 spray into both nostrils 2 (two) times daily. Use in each nostril as directed  . bifidobacterium infantis (ALIGN) capsule Take 1 capsule by mouth daily.    . calcium-vitamin D (OSCAL WITH D 500-200) 500-200 MG-UNIT per tablet Take 1 tablet by mouth daily.    . cetirizine (ZYRTEC) 10 MG chewable tablet Chew 10 mg by mouth daily.   . Coenzyme Q10 (COQ10 PO) Take by mouth daily.  . fish oil-omega-3 fatty acids 1000 MG capsule Take 2 g by mouth daily.  Marland Kitchen FLUoxetine (PROZAC) 10 MG capsule TAKE 1 CAPSULE BY MOUTH EVERY DAY  . Fluticasone Furoate (FLONASE  SENSIMIST NA) Place into the nose.  . ibuprofen (ADVIL,MOTRIN) 200 MG tablet Take 200 mg by mouth as directed.    . multivitamin (THERAGRAN) per tablet Take 1 tablet by mouth daily.    Marland Kitchen neomycin-polymyxin-hydrocortisone (CORTISPORIN) OTIC solution Place 3 drops into both ears 3 (three) times daily as needed.  . polyethylene glycol (MIRALAX) powder Take 17 g by mouth as needed.   . pravastatin (PRAVACHOL) 40 MG tablet TAKE 1 TABLET(40 MG) BY MOUTH DAILY  . [DISCONTINUED] azelastine (ASTELIN) 0.1 % nasal spray Place 1 spray into both nostrils 2 (two) times daily. Use in each nostril as directed  . [DISCONTINUED] oseltamivir (TAMIFLU) 75 MG capsule Take 1 capsule (75 mg total) by mouth 2 (two) times daily.   No facility-administered encounter medications on file as of 04/11/2018.     Review of Systems  Constitutional: Negative for appetite change and unexpected weight change.  HENT: Negative for congestion and sinus pressure.   Respiratory: Negative for cough, chest tightness and shortness of breath.   Cardiovascular: Negative for chest pain, palpitations and leg swelling.  Gastrointestinal: Negative for abdominal pain, diarrhea, nausea and vomiting.  Genitourinary: Negative for difficulty urinating and dysuria.  Musculoskeletal: Negative for joint swelling and myalgias.  Skin: Negative for color change and rash.  Neurological: Negative for dizziness, light-headedness and headaches.  Psychiatric/Behavioral: Negative for agitation and dysphoric mood.       Objective:    Physical Exam  Constitutional: She appears well-developed and well-nourished. No distress.  HENT:  Nose: Nose normal.  Mouth/Throat: Oropharynx is clear and moist.  Neck: Neck supple. No thyromegaly present.  Cardiovascular: Normal rate and regular rhythm.  Pulmonary/Chest: Breath sounds normal. No respiratory distress. She has no wheezes.  Abdominal: Soft. Bowel sounds are normal. There is no tenderness.    Musculoskeletal: She exhibits no edema or tenderness.  Lymphadenopathy:    She has no cervical adenopathy.  Skin: No rash noted. No erythema.  Psychiatric: She has a normal mood and affect. Her behavior is normal.    BP 140/88 (BP Location: Left Arm, Patient Position: Sitting, Cuff Size: Normal)   Pulse 76   Temp 98.4 F (36.9 C) (Oral)   Resp 16   Wt 178 lb 9.6 oz (81 kg)   LMP 12/12/1981   SpO2  99%   BMI 31.14 kg/m  Wt Readings from Last 3 Encounters:  04/11/18 178 lb 9.6 oz (81 kg)  01/31/18 186 lb 9.6 oz (84.6 kg)  11/30/17 187 lb (84.8 kg)     Lab Results  Component Value Date   WBC 7.8 11/28/2017   HGB 13.7 11/28/2017   HCT 41.4 11/28/2017   PLT 370.0 11/28/2017   GLUCOSE 92 04/09/2018   CHOL 174 04/09/2018   TRIG 149.0 04/09/2018   HDL 57.20 04/09/2018   LDLDIRECT 155.8 06/27/2013   LDLCALC 87 04/09/2018   ALT 13 04/09/2018   AST 13 04/09/2018   NA 140 04/09/2018   K 3.8 04/09/2018   CL 105 04/09/2018   CREATININE 1.11 04/09/2018   BUN 11 04/09/2018   CO2 27 04/09/2018   TSH 1.05 11/28/2017    Mm Screening Breast Tomo Bilateral  Result Date: 10/23/2017 CLINICAL DATA:  Screening. EXAM: 2D DIGITAL SCREENING BILATERAL MAMMOGRAM WITH CAD AND ADJUNCT TOMO COMPARISON:  Previous exam(s). ACR Breast Density Category b: There are scattered areas of fibroglandular density. FINDINGS: There are no findings suspicious for malignancy. Images were processed with CAD. IMPRESSION: No mammographic evidence of malignancy. A result letter of this screening mammogram will be mailed directly to the patient. RECOMMENDATION: Screening mammogram in one year. (Code:SM-B-01Y) BI-RADS CATEGORY  1: Negative. Electronically Signed   By: Franki Cabot M.D.   On: 10/23/2017 16:52       Assessment & Plan:   Problem List Items Addressed This Visit    Anxiety    On prozac.  Doing better.  Follow.       Environmental allergies    Appears to be doing well on current regimen.   Follow.       HTN (hypertension)    On amlodipine - low dose.  Blood pressures on outside checks under good control.  Continue same medication regimen.  Continue to follow pressures.        Relevant Orders   Basic metabolic panel   Hypercholesterolemia    On pravastatin.  Low cholesterol diet and exercise.  Follow lipid panel and liver function tests.        Relevant Orders   Hepatic function panel   Lipid panel    Other Visit Diagnoses    Need for vaccination against Streptococcus pneumoniae using pneumococcal conjugate vaccine 13    -  Primary   Discussed the need for pneumonia vaccine.  Agreed to [employer].    Relevant Orders   Pneumococcal conjugate vaccine 13-valent (Completed)       [name], MD

## 2018-04-17 ENCOUNTER — Encounter: Payer: Self-pay | Admitting: Internal Medicine

## 2018-04-17 NOTE — Assessment & Plan Note (Signed)
Appears to be doing well on current regimen.  Follow.

## 2018-04-17 NOTE — Assessment & Plan Note (Signed)
On pravastatin.  Low cholesterol diet and exercise.  Follow lipid panel and liver function tests.   

## 2018-04-17 NOTE — Assessment & Plan Note (Signed)
On amlodipine - low dose.  Blood pressures on outside checks under good control.  Continue same medication regimen.  Continue to follow pressures.

## 2018-04-17 NOTE — Assessment & Plan Note (Signed)
On prozac.  Doing better.  Follow.   

## 2018-05-03 ENCOUNTER — Other Ambulatory Visit: Payer: Self-pay | Admitting: Internal Medicine

## 2018-05-06 ENCOUNTER — Other Ambulatory Visit: Payer: Self-pay | Admitting: Internal Medicine

## 2018-05-06 DIAGNOSIS — F419 Anxiety disorder, unspecified: Secondary | ICD-10-CM

## 2018-05-22 ENCOUNTER — Other Ambulatory Visit: Payer: Self-pay | Admitting: Internal Medicine

## 2018-06-04 ENCOUNTER — Other Ambulatory Visit: Payer: Self-pay | Admitting: Internal Medicine

## 2018-06-04 DIAGNOSIS — R030 Elevated blood-pressure reading, without diagnosis of hypertension: Secondary | ICD-10-CM

## 2018-07-16 ENCOUNTER — Ambulatory Visit (INDEPENDENT_AMBULATORY_CARE_PROVIDER_SITE_OTHER): Payer: Medicare HMO | Admitting: Family Medicine

## 2018-07-16 VITALS — BP 136/80 | HR 79 | Temp 99.3°F | Resp 18 | Ht 63.5 in | Wt 179.4 lb

## 2018-07-16 DIAGNOSIS — J029 Acute pharyngitis, unspecified: Secondary | ICD-10-CM | POA: Diagnosis not present

## 2018-07-16 DIAGNOSIS — R070 Pain in throat: Secondary | ICD-10-CM | POA: Diagnosis not present

## 2018-07-16 LAB — POCT RAPID STREP A (OFFICE): RAPID STREP A SCREEN: NEGATIVE

## 2018-07-16 MED ORDER — AMOXICILLIN 500 MG PO CAPS: 500.0000 mg | ORAL_CAPSULE | Freq: Two times a day (BID) | ORAL | 0 refills | Status: AC

## 2018-07-16 NOTE — Patient Instructions (Signed)
Great to meet you!  Amoxicillin 2x per day for 10 days  Rest, increase fluids, do good handwashing & get a new toothbrush

## 2018-07-16 NOTE — Progress Notes (Signed)
Subjective:    Patient ID: Jill Torres, female    DOB: [birth year], 67 y.o.   MRN: [id number]  HPI  Patient presents to clinic c/o pain in throat for 3 days.  Patient has been using Zyrtec and Astelin nasal spray without much symptom relief.  She also has been taking Tylenol/Motrin as needed and doing salt water gargles, however her throat continues to be painful. States she is prone to strep throat and reports she had strep throat last year.  Patient Active Problem List   Diagnosis Date Noted  . Arthralgia 09/14/2017  . Rash 06/03/2016  . Hypercholesterolemia 03/05/2016  . Health care maintenance 10/21/2015  . Environmental allergies 09/07/2014  . HTN (hypertension) 08/18/2013  . Right carotid bruit 08/18/2013  . Anxiety 07/17/2013  . GERD (gastroesophageal reflux disease) 06/16/2013  . Breast cancer screening 04/18/2011  . DEVIATED NASAL SEPTUM 09/09/2010  . COLONIC POLYPS, HYPERPLASTIC, HX OF 03/27/2009  . Hypothyroidism 02/19/2009  . DIVERTICULOSIS, COLON 02/19/2009  . IRRITABLE BOWEL SYNDROME 02/19/2009    Social History   Tobacco Use  . Smoking status: Never Smoker  . Smokeless tobacco: Never Used  Substance Use Topics  . Alcohol use: No    Alcohol/week: 0.0 oz   Review of Systems  Constitutional: Positive for fatigue and fever.  HENT: Positive for congestion, postnasal drip and sore throat.   Eyes: Negative for pain, discharge, redness and itching.  Respiratory: Negative for cough, shortness of breath and wheezing.   Cardiovascular: Negative for chest pain, palpitations and leg swelling.  Gastrointestinal: Negative.   Genitourinary: Negative.   Skin: Negative for color change and pallor.  Neurological: Negative for dizziness, tremors and light-headedness.       Objective:   Physical Exam  Constitutional: She is oriented to person, place, and time. She appears well-developed and well-nourished. She does not appear ill. No distress.  HENT:  Head:  Normocephalic and atraumatic.  Right Ear: Hearing and ear canal normal. A middle ear effusion is present.  Left Ear: Hearing and ear canal normal. A middle ear effusion is present.  Mouth/Throat: Oropharyngeal exudate present.  Exudate & +2 swelling right tonsil  Neck:  Right cervical adenopathy  Cardiovascular: Normal rate and regular rhythm.  No murmur heard. Pulmonary/Chest: Effort normal and breath sounds normal. No respiratory distress. She has no wheezes. She has no rales.  Lymphadenopathy:    She has cervical adenopathy.  Neurological: She is alert and oriented to person, place, and time.  Skin: Skin is warm and dry. No pallor.  Psychiatric: She has a normal mood and affect. Her behavior is normal.  Nursing note and vitals reviewed.  Vitals:   07/16/18 1635  BP: 136/80  Pulse: 79  Resp: 18  Temp: 99.3 F (37.4 C)  SpO2: 98%      Assessment & Plan:  Exudative pharyngitis --rapid strep clinic is negative, however physical exam and symptoms reported by patient are consistent with strep throat.  We will treat with amoxicillin 500 mg twice a day for 10 days.  Patient advised to increase fluid intake, can use Tylenol or Motrin as needed for pain or fever, do good handwashing, throw away current toothbrush and get a new toothbrush to avoid any further spread of infection.  Throat culture was collected and sent out to lab will make patient aware of results once available.  She has also been advised to continue her current allergy medicine regimen as these medications can help treat associated symptoms of nasal congestion  and postnasal drip.   Return to clinic as needed if symptoms worsen or do not improve.   Keep already scheduled appt in September 2019.

## 2018-07-18 LAB — CULTURE, GROUP A STREP
MICRO NUMBER:: 90922886
SPECIMEN QUALITY:: ADEQUATE

## 2018-08-14 ENCOUNTER — Other Ambulatory Visit (INDEPENDENT_AMBULATORY_CARE_PROVIDER_SITE_OTHER): Payer: Medicare HMO

## 2018-08-14 DIAGNOSIS — I10 Essential (primary) hypertension: Secondary | ICD-10-CM

## 2018-08-14 DIAGNOSIS — E7800 Pure hypercholesterolemia, unspecified: Secondary | ICD-10-CM

## 2018-08-14 LAB — HEPATIC FUNCTION PANEL
ALT: 12 U/L (ref 0–35)
AST: 13 U/L (ref 0–37)
Albumin: 4.1 g/dL (ref 3.5–5.2)
Alkaline Phosphatase: 71 U/L (ref 39–117)
BILIRUBIN DIRECT: 0.1 mg/dL (ref 0.0–0.3)
Total Bilirubin: 0.5 mg/dL (ref 0.2–1.2)
Total Protein: 7.4 g/dL (ref 6.0–8.3)

## 2018-08-14 LAB — LIPID PANEL
CHOLESTEROL: 176 mg/dL (ref 0–200)
HDL: 55.3 mg/dL (ref >39.00)
LDL Cholesterol: 89 mg/dL (ref 0–99)
NonHDL: 120.43
Total CHOL/HDL Ratio: 3
Triglycerides: 159 mg/dL — ABNORMAL HIGH (ref 0.0–149.0)
VLDL: 31.8 mg/dL (ref 0.0–40.0)

## 2018-08-14 LAB — BASIC METABOLIC PANEL
BUN: 13 mg/dL (ref 6–23)
CO2: 27 mEq/L (ref 19–32)
Calcium: 9.9 mg/dL (ref 8.4–10.5)
Chloride: 103 mEq/L (ref 96–112)
Creatinine, Ser: 1.05 mg/dL (ref 0.40–1.20)
GFR: 55.72 mL/min — AB (ref >60.00)
Glucose, Bld: 94 mg/dL (ref 70–99)
Potassium: 4.1 mEq/L (ref 3.5–5.1)
SODIUM: 139 meq/L (ref 135–145)

## 2018-08-15 ENCOUNTER — Encounter: Payer: Self-pay | Admitting: Internal Medicine

## 2018-08-16 ENCOUNTER — Encounter: Payer: Self-pay | Admitting: Internal Medicine

## 2018-08-16 ENCOUNTER — Ambulatory Visit (INDEPENDENT_AMBULATORY_CARE_PROVIDER_SITE_OTHER): Payer: Medicare HMO | Admitting: Internal Medicine

## 2018-08-16 VITALS — BP 124/72 | HR 73 | Temp 98.1°F | Resp 18 | Wt 177.2 lb

## 2018-08-16 DIAGNOSIS — Z9109 Other allergy status, other than to drugs and biological substances: Secondary | ICD-10-CM

## 2018-08-16 DIAGNOSIS — I10 Essential (primary) hypertension: Secondary | ICD-10-CM

## 2018-08-16 DIAGNOSIS — F419 Anxiety disorder, unspecified: Secondary | ICD-10-CM | POA: Diagnosis not present

## 2018-08-16 DIAGNOSIS — Z23 Encounter for immunization: Secondary | ICD-10-CM

## 2018-08-16 DIAGNOSIS — R69 Illness, unspecified: Secondary | ICD-10-CM | POA: Diagnosis not present

## 2018-08-16 DIAGNOSIS — E7800 Pure hypercholesterolemia, unspecified: Secondary | ICD-10-CM

## 2018-08-16 MED ORDER — NEOMYCIN-POLYMYXIN-HC 3.5-10000-1 OT SOLN: 3.0000 [drp] | Freq: Three times a day (TID) | OTIC | 0 refills | Status: DC | PRN

## 2018-08-16 NOTE — Progress Notes (Signed)
Patient ID: Jill Torres, female   DOB: 18-Jul-1951, 67 y.o.   MRN: [id number]   Subjective:    Patient ID: Jill Torres, female    DOB: [birth year], 67 y.o.   MRN: [id number]  HPI  Patient here for a scheduled follow up.  She reports she is doing relatively well.  Having persistent problems with allergies.  Using astelin, flonase and saline nasal spray.  Had previously been seeing Dr Donneta Romberg.  Stopped allergy shots.  Persistent problems.  Discussed referral back to Dr Donneta Romberg.  No chest pain.  No sob.  No acid reflux.  No abdominal pain.  Bowels moving.  Handling stress.     Past Medical History:  Diagnosis Date  . Allergy   . Arthritis   . Depression   . Diverticulosis of colon (without mention of hemorrhage)   . GERD (gastroesophageal reflux disease)   . History of chicken pox   . History of shingles may 2013  . HTN (hypertension)   . Irritable bowel syndrome   . Unspecified hypothyroidism    Past Surgical History:  Procedure Laterality Date  . ABDOMINAL HYSTERECTOMY  1983   due to heavy bleeding & ovary cyst  . APPENDECTOMY  1974  . BLADDER DIVERTICULECTOMY  1990  . CESAREAN SECTION     x 2  . CHOLECYSTECTOMY     x 2  . DILATION AND CURETTAGE OF UTERUS     x 2  . DIRECT LARYNGOSCOPY Right 06/11/2015   Procedure: suspension microdirect laryngoscopy with biopsy of right tongue base;  Surgeon: Carloyn Manner, MD;  Location: [facility];  Service: ENT;  Laterality: Right;  . NASAL SEPTUM SURGERY  1999  . PARTIAL HYSTERECTOMY  1983   cyst on ovary and heavy bleeding    Family History  Problem Relation Age of Onset  . Arthritis Mother   . Hypertension Mother   . Hyperlipidemia Mother   . Atrial fibrillation Mother   . Heart disease Mother   . Diabetes Mother   . Hyperlipidemia Father   . Heart disease Father        s/p CABG  . Stroke Father   . Rheumatic fever Sister        s/p valve replacement  . Deep vein thrombosis Brother        after immobilization   .  Breast cancer Neg Hx    Social History   Socioeconomic History  . Marital status: Single    Spouse name: Not on file  . Number of children: 2  . Years of education: Not on file  . Highest education level: Not on file  Occupational History  . Occupation: Press photographer    Comment: Chief of Staff   Social Needs  . Financial resource strain: Not on file  . Food insecurity:    Worry: Not on file    Inability: Not on file  . Transportation needs:    Medical: Not on file    Non-medical: Not on file  Tobacco Use  . Smoking status: Never Smoker  . Smokeless tobacco: Never Used  Substance and Sexual Activity  . Alcohol use: No    Alcohol/week: 0.0 standard drinks  . Drug use: No  . Sexual activity: Not on file  Lifestyle  . Physical activity:    Days per week: Not on file    Minutes per session: Not on file  . Stress: Not on file  Relationships  . Social connections:    Talks on phone:  Not on file    Gets together: Not on file    Attends religious service: Not on file    Active member of club or organization: Not on file    Attends meetings of clubs or organizations: Not on file    Relationship status: Not on file  Other Topics Concern  . Not on file  Social History Narrative   No regular exercise   Lives in noisy environment- very stressful    Daily caffeine use     Outpatient Encounter Medications as of 08/16/2018  Medication Sig  . Fluticasone Furoate (FLONASE SENSIMIST NA) Place into the nose.  Marland Kitchen amLODipine (NORVASC) 2.5 MG tablet TAKE 1 TABLET BY MOUTH EVERY DAY  . azelastine (ASTELIN) 0.1 % nasal spray PLACE 1 SPRAY INTO BOTH NOSTRILS 2 (TWO) TIMES DAILY. USE IN EACH NOSTRIL AS DIRECTED  . bifidobacterium infantis (ALIGN) capsule Take 1 capsule by mouth daily.    . calcium-vitamin D (OSCAL WITH D 500-200) 500-200 MG-UNIT per tablet Take 1 tablet by mouth daily.    . cetirizine (ZYRTEC) 10 MG chewable tablet Chew 10 mg by mouth daily.   . Coenzyme Q10 (COQ10 PO)  Take by mouth daily.  . fish oil-omega-3 fatty acids 1000 MG capsule Take 2 g by mouth daily.  Marland Kitchen FLUoxetine (PROZAC) 10 MG capsule TAKE 1 CAPSULE BY MOUTH EVERY DAY (Patient taking differently: daily as needed. )  . ibuprofen (ADVIL,MOTRIN) 200 MG tablet Take 200 mg by mouth as directed.    . multivitamin (THERAGRAN) per tablet Take 1 tablet by mouth daily.    Marland Kitchen neomycin-polymyxin-hydrocortisone (CORTISPORIN) OTIC solution Place 3 drops into the left ear 3 (three) times daily as needed.  . polyethylene glycol (MIRALAX) powder Take 17 g by mouth as needed.   . pravastatin (PRAVACHOL) 40 MG tablet TAKE 1 TABLET(40 MG) BY MOUTH DAILY  . [DISCONTINUED] neomycin-polymyxin-hydrocortisone (CORTISPORIN) OTIC solution Place 3 drops into both ears 3 (three) times daily as needed.   No facility-administered encounter medications on file as of 08/16/2018.     Review of Systems  Constitutional: Negative for appetite change and unexpected weight change.  HENT: Positive for congestion and postnasal drip.   Respiratory: Negative for cough, chest tightness and shortness of breath.   Cardiovascular: Negative for chest pain, palpitations and leg swelling.  Gastrointestinal: Negative for abdominal pain, diarrhea, nausea and vomiting.  Genitourinary: Negative for difficulty urinating and dysuria.  Musculoskeletal: Negative for joint swelling and myalgias.  Skin: Negative for color change and rash.  Neurological: Negative for dizziness, light-headedness and headaches.  Psychiatric/Behavioral: Negative for agitation and dysphoric mood.       Objective:     Blood pressure rechecked by me:  128/84  Physical Exam  Constitutional: She appears well-developed and well-nourished. No distress.  HENT:  Nose: Nose normal.  Mouth/Throat: Oropharynx is clear and moist.  Neck: Neck supple. No thyromegaly present.  Cardiovascular: Normal rate and regular rhythm.  Pulmonary/Chest: Breath sounds normal. No respiratory  distress. She has no wheezes.  Abdominal: Soft. Bowel sounds are normal. There is no tenderness.  Musculoskeletal: She exhibits no edema or tenderness.  Lymphadenopathy:    She has no cervical adenopathy.  Skin: No rash noted. No erythema.  Psychiatric: She has a normal mood and affect. Her behavior is normal.    BP 124/72 (BP Location: Left Arm, Patient Position: Sitting, Cuff Size: Normal)   Pulse 73   Temp 98.1 F (36.7 C) (Oral)   Resp 18   Wt  177 lb 3.2 oz (80.4 kg)   LMP 12/12/1981   SpO2 97%   BMI 30.90 kg/m  Wt Readings from Last 3 Encounters:  08/16/18 177 lb 3.2 oz (80.4 kg)  07/16/18 179 lb 6 oz (81.4 kg)  04/11/18 178 lb 9.6 oz (81 kg)     Lab Results  Component Value Date   WBC 7.8 11/28/2017   HGB 13.7 11/28/2017   HCT 41.4 11/28/2017   PLT 370.0 11/28/2017   GLUCOSE 94 08/14/2018   CHOL 176 08/14/2018   TRIG 159.0 (H) 08/14/2018   HDL 55.30 08/14/2018   LDLDIRECT 155.8 06/27/2013   LDLCALC 89 08/14/2018   ALT 12 08/14/2018   AST 13 08/14/2018   NA 139 08/14/2018   K 4.1 08/14/2018   CL 103 08/14/2018   CREATININE 1.05 08/14/2018   BUN 13 08/14/2018   CO2 27 08/14/2018   TSH 1.05 11/28/2017    Mm Screening Breast Tomo Bilateral  Result Date: 10/23/2017 CLINICAL DATA:  Screening. EXAM: 2D DIGITAL SCREENING BILATERAL MAMMOGRAM WITH CAD AND ADJUNCT TOMO COMPARISON:  Previous exam(s). ACR Breast Density Category b: There are scattered areas of fibroglandular density. FINDINGS: There are no findings suspicious for malignancy. Images were processed with CAD. IMPRESSION: No mammographic evidence of malignancy. A result letter of this screening mammogram will be mailed directly to the patient. RECOMMENDATION: Screening mammogram in one year. (Code:SM-B-01Y) BI-RADS CATEGORY  1: Negative. Electronically Signed   By: Franki Cabot M.D.   On: 10/23/2017 16:52       Assessment & Plan:   Problem List Items Addressed This Visit    Anxiety    On prozac.  Doing well.  Follow.        Environmental allergies - Primary    Using astelin, flonase and saline nasal spray.  Takes an antihistamine daily.  Persistent problems.  Previously receiving allergy shots.  Refer back to Dr Donneta Romberg given persistent problems.        Relevant Orders   Ambulatory referral to Allergy   HTN (hypertension)    Blood pressure under good control.  Continue same medication regimen.  Follow pressures.  Follow metabolic panel.        Relevant Orders   CBC with Differential/Platelet   TSH   Basic metabolic panel   Hypercholesterolemia    On pravastatin.  Low cholesterol diet and exercise.  Follow lipid panel and liver function tests.        Relevant Orders   Hepatic function panel   Lipid panel    Other Visit Diagnoses    Need for influenza vaccination       Relevant Orders   Flu vaccine HIGH DOSE PF (Fluzone High dose) (Completed)       [name], MD

## 2018-08-18 ENCOUNTER — Encounter: Payer: Self-pay | Admitting: Internal Medicine

## 2018-08-18 NOTE — Assessment & Plan Note (Signed)
On pravastatin.  Low cholesterol diet and exercise.  Follow lipid panel and liver function tests.   

## 2018-08-18 NOTE — Assessment & Plan Note (Signed)
Blood pressure under good control.  Continue same medication regimen.  Follow pressures.  Follow metabolic panel.   

## 2018-08-18 NOTE — Assessment & Plan Note (Signed)
On prozac.  Doing well.  Follow.   

## 2018-08-18 NOTE — Assessment & Plan Note (Signed)
Using astelin, flonase and saline nasal spray.  Takes an antihistamine daily.  Persistent problems.  Previously receiving allergy shots.  Refer back to Dr Donneta Romberg given persistent problems.

## 2018-08-21 ENCOUNTER — Encounter: Payer: Self-pay | Admitting: Internal Medicine

## 2018-09-01 ENCOUNTER — Other Ambulatory Visit: Payer: Self-pay | Admitting: Internal Medicine

## 2018-09-01 DIAGNOSIS — R030 Elevated blood-pressure reading, without diagnosis of hypertension: Secondary | ICD-10-CM

## 2018-09-04 DIAGNOSIS — J309 Allergic rhinitis, unspecified: Secondary | ICD-10-CM | POA: Diagnosis not present

## 2018-09-04 DIAGNOSIS — J3089 Other allergic rhinitis: Secondary | ICD-10-CM | POA: Diagnosis not present

## 2018-09-10 ENCOUNTER — Other Ambulatory Visit: Payer: Self-pay | Admitting: Internal Medicine

## 2018-09-10 DIAGNOSIS — F419 Anxiety disorder, unspecified: Secondary | ICD-10-CM

## 2018-09-10 DIAGNOSIS — J3089 Other allergic rhinitis: Secondary | ICD-10-CM | POA: Diagnosis not present

## 2018-09-17 DIAGNOSIS — R69 Illness, unspecified: Secondary | ICD-10-CM | POA: Diagnosis not present

## 2018-09-21 DIAGNOSIS — J3089 Other allergic rhinitis: Secondary | ICD-10-CM | POA: Diagnosis not present

## 2018-09-22 ENCOUNTER — Other Ambulatory Visit: Payer: Self-pay | Admitting: Internal Medicine

## 2018-09-25 DIAGNOSIS — J3089 Other allergic rhinitis: Secondary | ICD-10-CM | POA: Diagnosis not present

## 2018-09-27 ENCOUNTER — Other Ambulatory Visit: Payer: Self-pay | Admitting: Internal Medicine

## 2018-09-27 DIAGNOSIS — Z1231 Encounter for screening mammogram for malignant neoplasm of breast: Secondary | ICD-10-CM

## 2018-09-28 DIAGNOSIS — J3089 Other allergic rhinitis: Secondary | ICD-10-CM | POA: Diagnosis not present

## 2018-10-05 DIAGNOSIS — J3089 Other allergic rhinitis: Secondary | ICD-10-CM | POA: Diagnosis not present

## 2018-10-16 DIAGNOSIS — J3089 Other allergic rhinitis: Secondary | ICD-10-CM | POA: Diagnosis not present

## 2018-10-18 DIAGNOSIS — J3089 Other allergic rhinitis: Secondary | ICD-10-CM | POA: Diagnosis not present

## 2018-10-23 DIAGNOSIS — J3089 Other allergic rhinitis: Secondary | ICD-10-CM | POA: Diagnosis not present

## 2018-10-24 ENCOUNTER — Ambulatory Visit
Admission: RE | Admit: 2018-10-24 | Discharge: 2018-10-24 | Disposition: A | Discharge status: Home / Self Care | Payer: Medicare HMO | Source: Ambulatory Visit | Attending: Internal Medicine | Admitting: Internal Medicine

## 2018-10-24 DIAGNOSIS — Z1231 Encounter for screening mammogram for malignant neoplasm of breast: Secondary | ICD-10-CM | POA: Diagnosis not present

## 2018-10-25 DIAGNOSIS — J3089 Other allergic rhinitis: Secondary | ICD-10-CM | POA: Diagnosis not present

## 2018-10-30 DIAGNOSIS — J3089 Other allergic rhinitis: Secondary | ICD-10-CM | POA: Diagnosis not present

## 2018-11-01 DIAGNOSIS — J3089 Other allergic rhinitis: Secondary | ICD-10-CM | POA: Diagnosis not present

## 2018-11-06 DIAGNOSIS — J3089 Other allergic rhinitis: Secondary | ICD-10-CM | POA: Diagnosis not present

## 2018-11-15 DIAGNOSIS — J3089 Other allergic rhinitis: Secondary | ICD-10-CM | POA: Diagnosis not present

## 2018-11-20 DIAGNOSIS — J3089 Other allergic rhinitis: Secondary | ICD-10-CM | POA: Diagnosis not present

## 2018-11-23 DIAGNOSIS — J3089 Other allergic rhinitis: Secondary | ICD-10-CM | POA: Diagnosis not present

## 2018-11-26 ENCOUNTER — Other Ambulatory Visit: Payer: Self-pay | Admitting: Internal Medicine

## 2018-11-26 DIAGNOSIS — R030 Elevated blood-pressure reading, without diagnosis of hypertension: Secondary | ICD-10-CM

## 2018-11-26 IMAGING — MG 2D DIGITAL SCREENING BILATERAL MAMMOGRAM WITH CAD AND ADJUNCT TO
8 of 13 series · 8 of 29 positions shown · non-contrast
Comparison: Previous exam(s).

CLINICAL DATA: Screening.

EXAM:
2D DIGITAL SCREENING BILATERAL MAMMOGRAM WITH CAD AND ADJUNCT TOMO

[R MLO (1 of 2)]
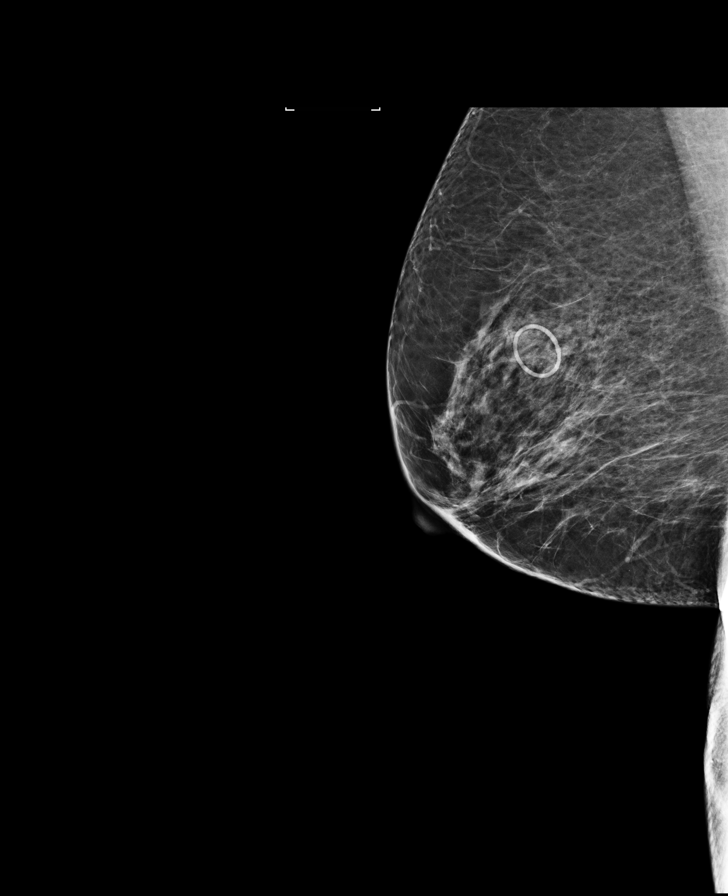

[R MLO synth-2D]
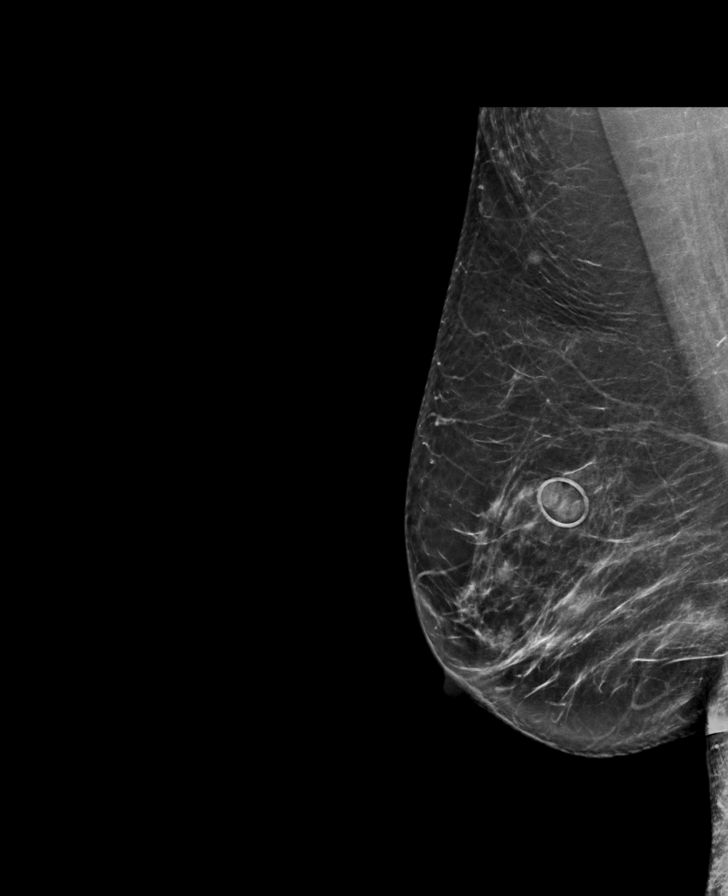

[L MLO]
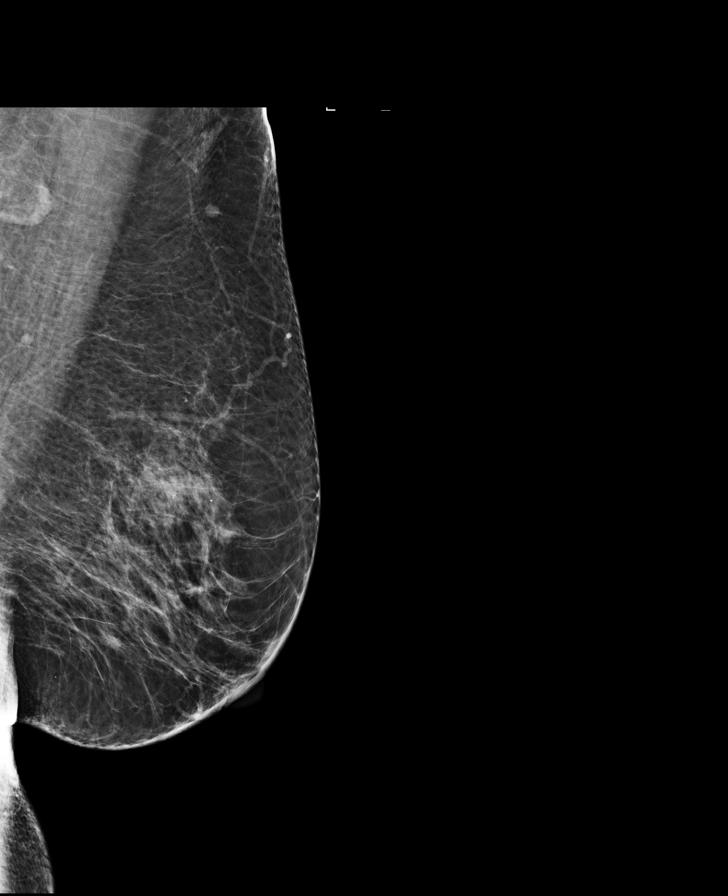

[L CC]
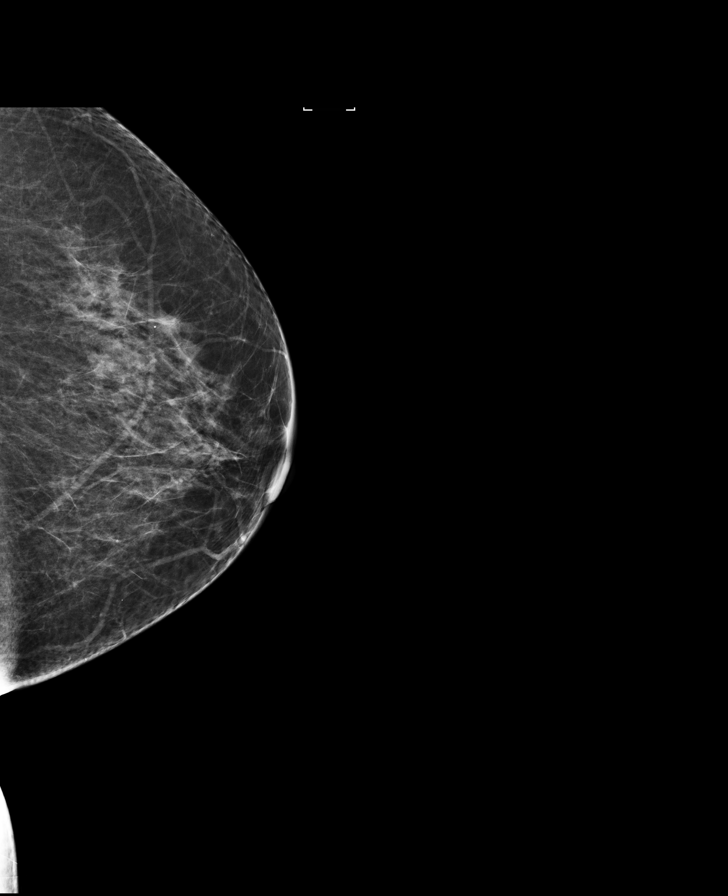

[L MLO synth-2D]
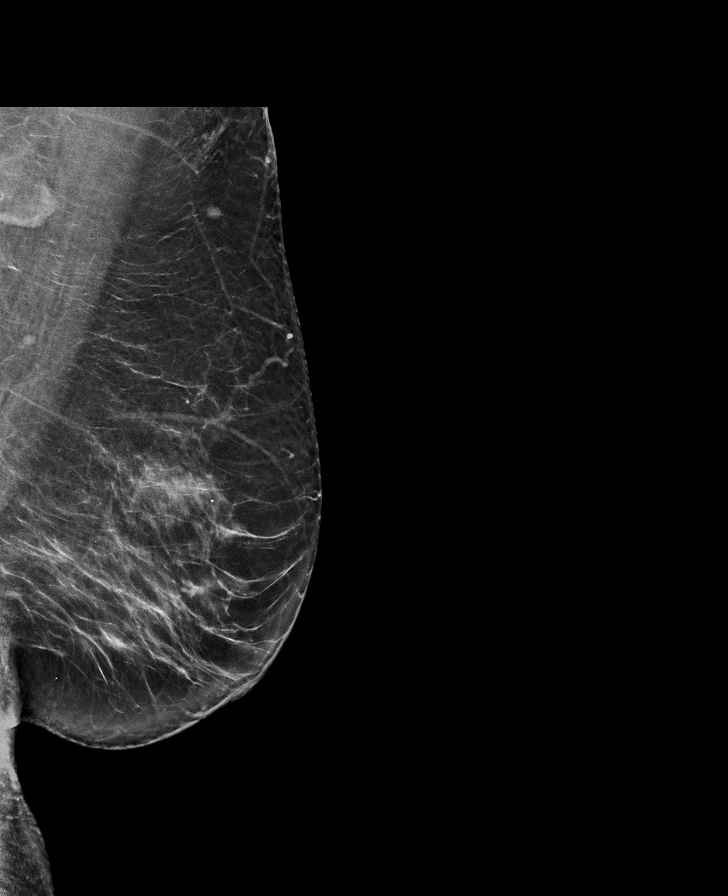

[R CC synth-2D]
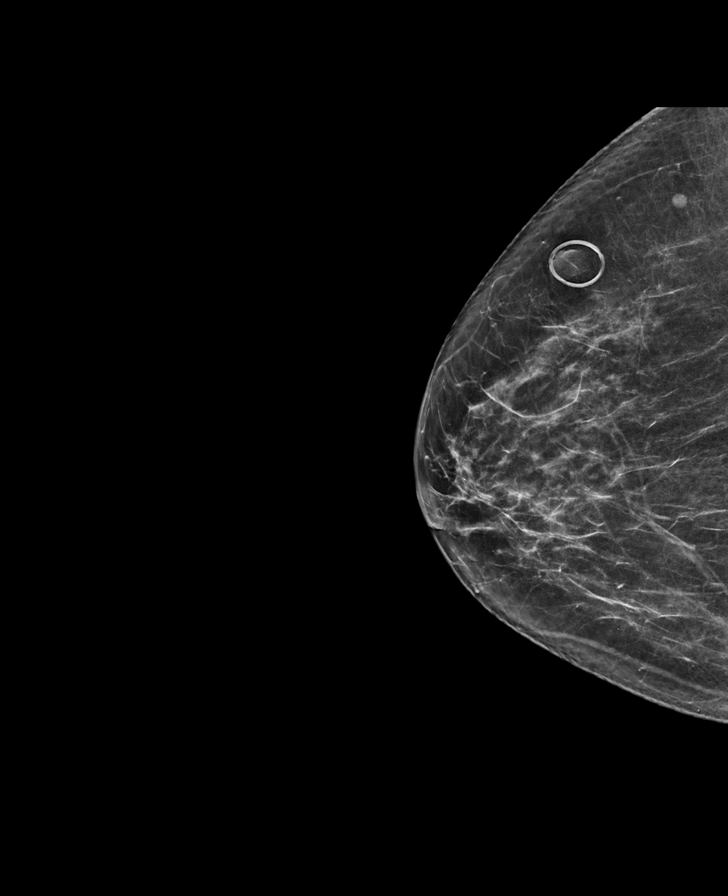

[R CC]
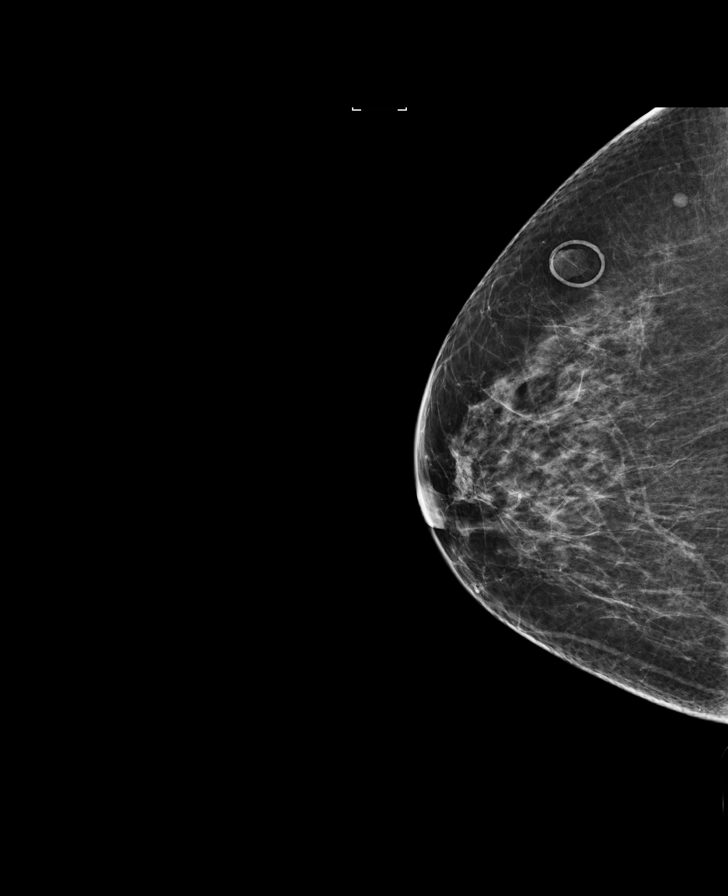

[R MLO (2 of 2)]
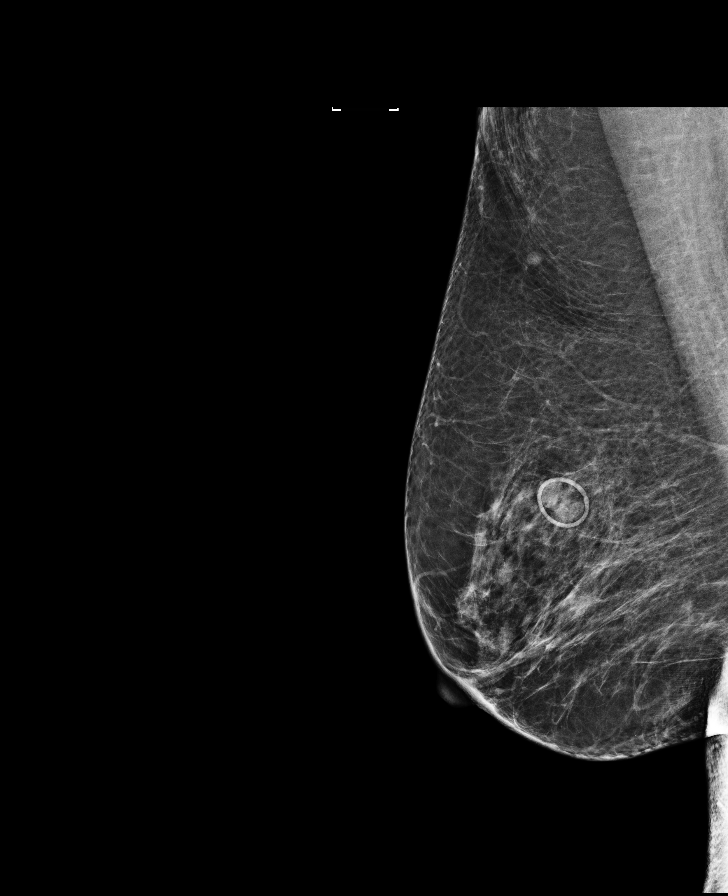

[8 of 29 positions shown; findings below may reference images not displayed]

ACR Breast Density Category b: There are scattered areas of
fibroglandular density.
FINDINGS: There are no findings suspicious for malignancy. Images were
processed with CAD.
IMPRESSION: No mammographic evidence of malignancy. A result letter of this
screening mammogram will be mailed directly to the patient.

RECOMMENDATION:
Screening mammogram in one year. (Code:97-6-RS4)

BI-RADS CATEGORY  1: Negative.

## 2018-11-27 DIAGNOSIS — J3089 Other allergic rhinitis: Secondary | ICD-10-CM | POA: Diagnosis not present

## 2018-11-30 DIAGNOSIS — J3089 Other allergic rhinitis: Secondary | ICD-10-CM | POA: Diagnosis not present

## 2018-12-04 DIAGNOSIS — J3089 Other allergic rhinitis: Secondary | ICD-10-CM | POA: Diagnosis not present

## 2018-12-06 DIAGNOSIS — J3089 Other allergic rhinitis: Secondary | ICD-10-CM | POA: Diagnosis not present

## 2018-12-13 DIAGNOSIS — J3089 Other allergic rhinitis: Secondary | ICD-10-CM | POA: Diagnosis not present

## 2018-12-18 ENCOUNTER — Other Ambulatory Visit (INDEPENDENT_AMBULATORY_CARE_PROVIDER_SITE_OTHER): Payer: Medicare HMO

## 2018-12-18 DIAGNOSIS — J3089 Other allergic rhinitis: Secondary | ICD-10-CM | POA: Diagnosis not present

## 2018-12-18 DIAGNOSIS — E7800 Pure hypercholesterolemia, unspecified: Secondary | ICD-10-CM | POA: Diagnosis not present

## 2018-12-18 DIAGNOSIS — I10 Essential (primary) hypertension: Secondary | ICD-10-CM | POA: Diagnosis not present

## 2018-12-18 LAB — CBC WITH DIFFERENTIAL/PLATELET
BASOS PCT: 1.1 % (ref 0.0–3.0)
Basophils Absolute: 0.1 10*3/uL (ref 0.0–0.1)
EOS ABS: 0.1 10*3/uL (ref 0.0–0.7)
Eosinophils Relative: 1.5 % (ref 0.0–5.0)
HEMATOCRIT: 41.4 % (ref 36.0–46.0)
HEMOGLOBIN: 13.7 g/dL (ref 12.0–15.0)
Lymphocytes Relative: 33.8 % (ref 12.0–46.0)
Lymphs Abs: 3.1 10*3/uL (ref 0.7–4.0)
MCHC: 33 g/dL (ref 30.0–36.0)
MCV: 91.7 fl (ref 78.0–100.0)
Monocytes Absolute: 0.7 10*3/uL (ref 0.1–1.0)
Monocytes Relative: 7.8 % (ref 3.0–12.0)
Neutro Abs: 5.2 10*3/uL (ref 1.4–7.7)
Neutrophils Relative %: 55.8 % (ref 43.0–77.0)
Platelets: 366 10*3/uL (ref 150.0–400.0)
RBC: 4.51 Mil/uL (ref 3.87–5.11)
RDW: 13.4 % (ref 11.5–15.5)
WBC: 9.3 10*3/uL (ref 4.0–10.5)

## 2018-12-18 LAB — HEPATIC FUNCTION PANEL
ALT: 12 U/L (ref 0–35)
AST: 14 U/L (ref 0–37)
Albumin: 4.4 g/dL (ref 3.5–5.2)
Alkaline Phosphatase: 44 U/L (ref 39–117)
BILIRUBIN TOTAL: 0.6 mg/dL (ref 0.2–1.2)
Bilirubin, Direct: 0.1 mg/dL (ref 0.0–0.3)
Total Protein: 6.5 g/dL (ref 6.0–8.3)

## 2018-12-18 LAB — LIPID PANEL
CHOL/HDL RATIO: 3
CHOLESTEROL: 189 mg/dL (ref 0–200)
HDL: 70.1 mg/dL (ref >39.00)
LDL Cholesterol: 107 mg/dL — ABNORMAL HIGH (ref 0–99)
NONHDL: 118.9
Triglycerides: 59 mg/dL (ref 0.0–149.0)
VLDL: 11.8 mg/dL (ref 0.0–40.0)

## 2018-12-18 LAB — BASIC METABOLIC PANEL
BUN: 12 mg/dL (ref 6–23)
CALCIUM: 9.9 mg/dL (ref 8.4–10.5)
CHLORIDE: 103 meq/L (ref 96–112)
CO2: 28 meq/L (ref 19–32)
CREATININE: 0.81 mg/dL (ref 0.40–1.20)
GFR: 75.09 mL/min (ref >60.00)
GLUCOSE: 95 mg/dL (ref 70–99)
Potassium: 5.3 mEq/L — ABNORMAL HIGH (ref 3.5–5.1)
Sodium: 138 mEq/L (ref 135–145)

## 2018-12-18 LAB — TSH: TSH: 6.44 u[IU]/mL — ABNORMAL HIGH (ref 0.35–4.50)

## 2018-12-19 ENCOUNTER — Ambulatory Visit (INDEPENDENT_AMBULATORY_CARE_PROVIDER_SITE_OTHER): Payer: Medicare HMO | Admitting: Internal Medicine

## 2018-12-19 ENCOUNTER — Encounter: Payer: Self-pay | Admitting: Internal Medicine

## 2018-12-19 VITALS — BP 138/78 | HR 77 | Temp 97.8°F | Resp 18 | Ht 64.0 in | Wt 178.8 lb

## 2018-12-19 DIAGNOSIS — Z9109 Other allergy status, other than to drugs and biological substances: Secondary | ICD-10-CM

## 2018-12-19 DIAGNOSIS — R109 Unspecified abdominal pain: Secondary | ICD-10-CM | POA: Diagnosis not present

## 2018-12-19 DIAGNOSIS — E039 Hypothyroidism, unspecified: Secondary | ICD-10-CM | POA: Diagnosis not present

## 2018-12-19 DIAGNOSIS — Z86010 Personal history of colonic polyps: Secondary | ICD-10-CM | POA: Diagnosis not present

## 2018-12-19 DIAGNOSIS — E875 Hyperkalemia: Secondary | ICD-10-CM | POA: Diagnosis not present

## 2018-12-19 DIAGNOSIS — R69 Illness, unspecified: Secondary | ICD-10-CM | POA: Diagnosis not present

## 2018-12-19 DIAGNOSIS — I10 Essential (primary) hypertension: Secondary | ICD-10-CM

## 2018-12-19 DIAGNOSIS — Z0000 Encounter for general adult medical examination without abnormal findings: Secondary | ICD-10-CM | POA: Diagnosis not present

## 2018-12-19 DIAGNOSIS — E7800 Pure hypercholesterolemia, unspecified: Secondary | ICD-10-CM | POA: Diagnosis not present

## 2018-12-19 DIAGNOSIS — F419 Anxiety disorder, unspecified: Secondary | ICD-10-CM | POA: Diagnosis not present

## 2018-12-19 LAB — POTASSIUM: POTASSIUM: 4.1 meq/L (ref 3.5–5.1)

## 2018-12-19 NOTE — Assessment & Plan Note (Signed)
Physical today 12/19/18.  Mammogram 10/24/18 - birads I.  Colonoscopy 02/2009 - hyperplastic polyp.  Recommended f/u in 10 years.

## 2018-12-19 NOTE — Progress Notes (Signed)
Patient ID: Jill Torres, female   DOB: December 22, 1950, 68 y.o.   MRN: [id number]   Subjective:    Patient ID: Jill Torres, female    DOB: 07-15-1951, 68 y.o.   MRN: [id number]  HPI  Patient here for her physical exam.  She reports she has been doing relatively well.  Christmas, she noticed some headache, fatigue and nausea/diarrhea.  The worst part lasted 24 hours.  Some residual bowel change for brief period after.  Feels better now.  Feels back to normal.  Had normal bowel movement today.  No chest pain.  No sob.  No acid reflux.  Previously had some lower left abdominal discomfort.  States has intermittent episodes of bowel change and abdominal discomfort.  Discussed labs.  Eating.     Past Medical History:  Diagnosis Date  . Allergy   . Arthritis   . Depression   . Diverticulosis of colon (without mention of hemorrhage)   . GERD (gastroesophageal reflux disease)   . History of chicken pox   . History of shingles may 2013  . HTN (hypertension)   . Irritable bowel syndrome   . Unspecified hypothyroidism    Past Surgical History:  Procedure Laterality Date  . ABDOMINAL HYSTERECTOMY  1983   due to heavy bleeding & ovary cyst  . APPENDECTOMY  1974  . BLADDER DIVERTICULECTOMY  1990  . CESAREAN SECTION     x 2  . CHOLECYSTECTOMY     x 2  . DILATION AND CURETTAGE OF UTERUS     x 2  . DIRECT LARYNGOSCOPY Right 06/11/2015   Procedure: suspension microdirect laryngoscopy with biopsy of right tongue base;  Surgeon: Carloyn Manner, MD;  Location: [facility];  Service: ENT;  Laterality: Right;  . NASAL SEPTUM SURGERY  1999  . PARTIAL HYSTERECTOMY  1983   cyst on ovary and heavy bleeding    Family History  Problem Relation Age of Onset  . Arthritis Mother   . Hypertension Mother   . Hyperlipidemia Mother   . Atrial fibrillation Mother   . Heart disease Mother   . Diabetes Mother   . Hyperlipidemia Father   . Heart disease Father        s/p CABG  . Stroke Father   .  Rheumatic fever Sister        s/p valve replacement  . Deep vein thrombosis Brother        after immobilization   . Breast cancer Neg Hx    Social History   Socioeconomic History  . Marital status: Single    Spouse name: Not on file  . Number of children: 2  . Years of education: Not on file  . Highest education level: Not on file  Occupational History  . Occupation: Press photographer    Comment: Chief of Staff   Social Needs  . Financial resource strain: Not on file  . Food insecurity:    Worry: Not on file    Inability: Not on file  . Transportation needs:    Medical: Not on file    Non-medical: Not on file  Tobacco Use  . Smoking status: Never Smoker  . Smokeless tobacco: Never Used  Substance and Sexual Activity  . Alcohol use: No    Alcohol/week: 0.0 standard drinks  . Drug use: No  . Sexual activity: Not on file  Lifestyle  . Physical activity:    Days per week: Not on file    Minutes per session: Not on  file  . Stress: Not on file  Relationships  . Social connections:    Talks on phone: Not on file    Gets together: Not on file    Attends religious service: Not on file    Active member of club or organization: Not on file    Attends meetings of clubs or organizations: Not on file    Relationship status: Not on file  Other Topics Concern  . Not on file  Social History Narrative   No regular exercise   Lives in noisy environment- very stressful    Daily caffeine use     Outpatient Encounter Medications as of 12/19/2018  Medication Sig  . amLODipine (NORVASC) 2.5 MG tablet TAKE 1 TABLET BY MOUTH EVERY DAY  . azelastine (ASTELIN) 0.1 % nasal spray PLACE 1 SPRAY INTO BOTH NOSTRILS 2 (TWO) TIMES DAILY. USE IN EACH NOSTRIL AS DIRECTED  . bifidobacterium infantis (ALIGN) capsule Take 1 capsule by mouth daily.    . calcium-vitamin D (OSCAL WITH D 500-200) 500-200 MG-UNIT per tablet Take 1 tablet by mouth daily.    . Coenzyme Q10 (COQ10 PO) Take by mouth daily.  .  fish oil-omega-3 fatty acids 1000 MG capsule Take 2 g by mouth daily.  . Fluticasone Furoate (FLONASE SENSIMIST NA) Place into the nose.  . ibuprofen (ADVIL,MOTRIN) 200 MG tablet Take 200 mg by mouth as directed.    Marland Kitchen levocetirizine (XYZAL) 5 MG tablet Take 5 mg by mouth every evening.  . montelukast (SINGULAIR) 10 MG tablet Take 10 mg by mouth daily.  . multivitamin (THERAGRAN) per tablet Take 1 tablet by mouth daily.    Marland Kitchen neomycin-polymyxin-hydrocortisone (CORTISPORIN) OTIC solution Place 3 drops into the left ear 3 (three) times daily as needed.  . polyethylene glycol (MIRALAX) powder Take 17 g by mouth as needed.   . pravastatin (PRAVACHOL) 40 MG tablet TAKE 1 TABLET(40 MG) BY MOUTH DAILY  . [DISCONTINUED] cetirizine (ZYRTEC) 10 MG chewable tablet Chew 10 mg by mouth daily.   . [DISCONTINUED] FLUoxetine (PROZAC) 10 MG capsule TAKE 1 CAPSULE BY MOUTH EVERY DAY   No facility-administered encounter medications on file as of 12/19/2018.     Review of Systems  Constitutional: Negative for appetite change and unexpected weight change.  HENT: Negative for congestion and sinus pressure.   Eyes: Negative for pain and visual disturbance.  Respiratory: Negative for cough, chest tightness and shortness of breath.   Cardiovascular: Negative for chest pain, palpitations and leg swelling.  Gastrointestinal: Negative for nausea and vomiting.       Previous abdominal pain and diarrhea as outlined.    Genitourinary: Negative for difficulty urinating and dysuria.  Musculoskeletal: Negative for joint swelling and myalgias.  Skin: Negative for color change and rash.  Neurological: Negative for dizziness, light-headedness and headaches.  Hematological: Negative for adenopathy. Does not bruise/bleed easily.  Psychiatric/Behavioral: Negative for agitation and dysphoric mood.       Objective:    Physical Exam Constitutional:      General: She is not in acute distress.    Appearance: Normal appearance.  She is well-developed.  HENT:     Nose: Nose normal. No congestion.     Mouth/Throat:     Pharynx: No oropharyngeal exudate or posterior oropharyngeal erythema.  Eyes:     General: No scleral icterus.       Right eye: No discharge.        Left eye: No discharge.  Neck:     Musculoskeletal: Neck  supple. No muscular tenderness.     Thyroid: No thyromegaly.  Cardiovascular:     Rate and Rhythm: Normal rate and regular rhythm.  Pulmonary:     Effort: No tachypnea, accessory muscle usage or respiratory distress.     Breath sounds: Normal breath sounds. No decreased breath sounds or wheezing.  Chest:     Breasts:        Right: No inverted nipple, mass, nipple discharge or tenderness (no axillary adenopathy).        Left: No inverted nipple, mass, nipple discharge or tenderness (no axilarry adenopathy).  Abdominal:     General: Bowel sounds are normal.     Palpations: Abdomen is soft.     Tenderness: There is no abdominal tenderness.  Musculoskeletal:        General: No swelling or tenderness.  Lymphadenopathy:     Cervical: No cervical adenopathy.  Skin:    Findings: No erythema or rash.  Neurological:     Mental Status: She is alert and oriented to person, place, and time.  Psychiatric:        Mood and Affect: Mood normal.        Behavior: Behavior normal.     BP 138/78 (BP Location: Left Arm, Patient Position: Sitting, Cuff Size: Normal)   Pulse 77   Temp 97.8 F (36.6 C) (Oral)   Resp 18   Ht 5\' 4"  (1.626 m)   Wt 178 lb 12.8 oz (81.1 kg)   LMP 12/12/1981   SpO2 99%   BMI 30.69 kg/m  Wt Readings from Last 3 Encounters:  12/19/18 178 lb 12.8 oz (81.1 kg)  08/16/18 177 lb 3.2 oz (80.4 kg)  07/16/18 179 lb 6 oz (81.4 kg)     Lab Results  Component Value Date   WBC 9.3 12/18/2018   HGB 13.7 12/18/2018   HCT 41.4 12/18/2018   PLT 366.0 12/18/2018   GLUCOSE 95 12/18/2018   CHOL 189 12/18/2018   TRIG 59.0 12/18/2018   HDL 70.10 12/18/2018   LDLDIRECT 155.8  06/27/2013   LDLCALC 107 (H) 12/18/2018   ALT 12 12/18/2018   AST 14 12/18/2018   NA 138 12/18/2018   K 4.1 12/19/2018   CL 103 12/18/2018   CREATININE 0.81 12/18/2018   BUN 12 12/18/2018   CO2 28 12/18/2018   TSH 6.44 (H) 12/18/2018    Mm 3d Screen Breast Bilateral  Result Date: 10/24/2018 CLINICAL DATA:  Screening. EXAM: DIGITAL SCREENING BILATERAL MAMMOGRAM WITH TOMO AND CAD COMPARISON:  Previous exam(s). ACR Breast Density Category b: There are scattered areas of fibroglandular density. FINDINGS: There are no findings suspicious for malignancy. Images were processed with CAD. IMPRESSION: No mammographic evidence of malignancy. A result letter of this screening mammogram will be mailed directly to the patient. RECOMMENDATION: Screening mammogram in one year. (Code:SM-B-01Y) BI-RADS CATEGORY  1: Negative. Electronically Signed   By: Lillia Mountain M.D.   On: 10/24/2018 14:16       Assessment & Plan:   Problem List Items Addressed This Visit    Anxiety    On prozac.  Doing well.  Follow.        COLONIC POLYPS, HYPERPLASTIC, HX OF    Colonoscopy 2010.  Recommended f/u colonoscopy in 2020.        Relevant Orders   Ambulatory referral to Gastroenterology   Environmental allergies    Controlled on current regimen.        Health care maintenance    Physical today  12/19/18.  Mammogram 10/24/18 - birads I.  Colonoscopy 02/2009 - hyperplastic polyp.  Recommended f/u in 10 years.        HTN (hypertension)    Blood pressure under good control.  Continue same medication regimen.  Follow pressures.  Follow metabolic panel.        Hypercholesterolemia    On pravastatin.  Low cholesterol diet and exercise.  Follow lipid panel and liver function tests.        Hypothyroidism    tsh slightly increased.  Recheck tsh in 6 weeks.         Other Visit Diagnoses    Routine general medical examination at a health care facility    -  Primary   Hyperkalemia       Relevant Orders    Potassium (Completed)   Abdominal pain, unspecified abdominal location       Recent flare with abdominal pain and diarrhea and nausea.  Resolved now.  Flares intermittently.  Due colonoscopy.  Refer to GI.    Relevant Orders   Ambulatory referral to Gastroenterology       [name], MD

## 2018-12-20 DIAGNOSIS — J3089 Other allergic rhinitis: Secondary | ICD-10-CM | POA: Diagnosis not present

## 2018-12-23 ENCOUNTER — Encounter: Payer: Self-pay | Admitting: Internal Medicine

## 2018-12-23 NOTE — Assessment & Plan Note (Signed)
Controlled on current regimen.   

## 2018-12-23 NOTE — Assessment & Plan Note (Signed)
Blood pressure under good control.  Continue same medication regimen.  Follow pressures.  Follow metabolic panel.   

## 2018-12-23 NOTE — Assessment & Plan Note (Signed)
On pravastatin.  Low cholesterol diet and exercise.  Follow lipid panel and liver function tests.   

## 2018-12-23 NOTE — Assessment & Plan Note (Signed)
On prozac.  Doing well.  Follow.

## 2018-12-23 NOTE — Assessment & Plan Note (Signed)
Colonoscopy 2010.  Recommended f/u colonoscopy in 2020.

## 2018-12-23 NOTE — Assessment & Plan Note (Signed)
tsh slightly increased.  Recheck tsh in 6 weeks.

## 2018-12-25 DIAGNOSIS — J3089 Other allergic rhinitis: Secondary | ICD-10-CM | POA: Diagnosis not present

## 2018-12-31 ENCOUNTER — Encounter: Payer: Self-pay | Admitting: Gastroenterology

## 2019-01-03 DIAGNOSIS — J3089 Other allergic rhinitis: Secondary | ICD-10-CM | POA: Diagnosis not present

## 2019-01-10 DIAGNOSIS — J3089 Other allergic rhinitis: Secondary | ICD-10-CM | POA: Diagnosis not present

## 2019-01-17 DIAGNOSIS — J3089 Other allergic rhinitis: Secondary | ICD-10-CM | POA: Diagnosis not present

## 2019-01-25 ENCOUNTER — Ambulatory Visit (INDEPENDENT_AMBULATORY_CARE_PROVIDER_SITE_OTHER): Payer: Medicare HMO | Admitting: Gastroenterology

## 2019-01-25 ENCOUNTER — Encounter: Payer: Self-pay | Admitting: Gastroenterology

## 2019-01-25 VITALS — BP 142/80 | HR 76 | Ht 63.0 in | Wt 179.1 lb

## 2019-01-25 DIAGNOSIS — R1032 Left lower quadrant pain: Secondary | ICD-10-CM | POA: Diagnosis not present

## 2019-01-25 MED ORDER — PEG 3350-KCL-NA BICARB-NACL 420 G PO SOLR: 4000.0000 mL | ORAL | 0 refills | Status: DC

## 2019-01-25 NOTE — Patient Instructions (Addendum)
You will be set up for a colonoscopy for lower abdominal pains.  Thank you for entrusting me with your care and choosing [facility].  Dr Ardis Hughs

## 2019-01-25 NOTE — Progress Notes (Signed)
HPI: This is a very pleasant 68 year old woman   who was referred to me by [name], MD  to evaluate lower abdominal pains, bloating.    Chief complaint is lower abdominal pains, bloating  Intermittently she has had left lower quadrant pains over the years.  She has blamed diverticulosis but I do not know that she has ever been proven to have diverticulitis.  Recently in the past couple months she was bothered by some left lower quadrant discomforts.  They are dull.  Sometimes burning in nature.  She started drinking some type of a "cultured milk drink" called Keefer and since then the left lower quadrant pain has significantly improved.  She has bloating often.  Sometimes struggles to move her bowels with constipation.  Stable weight  No bleeding.    I did a colonoscopy for her April 2010 (because her primary gastroenterologist, Dr. [name] was out of town) for diarrhea, terminal ileum was normal.  She had moderate diverticulosis in the left colon.  She had hemorrhoids.  A single subcentimeter polyp was removed.  Random biopsies from her colon were taken.  Pathology showed no microscopic colitis and the polyp was not adenomatous.  She was recommended to have repeat colonoscopy at 10-year interval.   Old Data Reviewed: Blood work January 2020 shows normal basic metabolic profile except for potassium 5.3, TSH elevated at 6.4, liver function tests all normal, CBC normal.    Review of systems: Pertinent positive and negative review of systems were noted in the above HPI section. All other review negative.   Past Medical History:  Diagnosis Date  . Allergy   . Arthritis   . Depression   . Diverticulosis of colon (without mention of hemorrhage)   . GERD (gastroesophageal reflux disease)   . History of chicken pox   . History of shingles may 2013  . HLD (hyperlipidemia)   . HTN (hypertension)   . Irritable bowel syndrome   . Unspecified hypothyroidism     Past Surgical  History:  Procedure Laterality Date  . ABDOMINAL HYSTERECTOMY  1983   due to heavy bleeding & ovary cyst  . APPENDECTOMY  1974  . BLADDER DIVERTICULECTOMY  1990  . CESAREAN SECTION     x 2  . CHOLECYSTECTOMY    . DILATION AND CURETTAGE OF UTERUS     x 2  . DIRECT LARYNGOSCOPY Right 06/11/2015   Procedure: suspension microdirect laryngoscopy with biopsy of right tongue base;  Surgeon: Carloyn Manner, MD;  Location: [facility] ORS;  Service: ENT;  Laterality: Right;  . NASAL SEPTUM SURGERY  1999  . PARTIAL HYSTERECTOMY  1983   cyst on ovary and heavy bleeding     Current Outpatient Medications  Medication Sig Dispense Refill  . AMBULATORY NON FORMULARY MEDICATION Allergy shots once a week    . amLODipine (NORVASC) 2.5 MG tablet TAKE 1 TABLET BY MOUTH EVERY DAY 90 tablet 0  . azelastine (ASTELIN) 0.1 % nasal spray PLACE 1 SPRAY INTO BOTH NOSTRILS 2 (TWO) TIMES DAILY. USE IN EACH NOSTRIL AS DIRECTED 30 mL 3  . bifidobacterium infantis (ALIGN) capsule Take 1 capsule by mouth daily.      . calcium-vitamin D (OSCAL WITH D 500-200) 500-200 MG-UNIT per tablet Take 1 tablet by mouth daily.      . Coenzyme Q10 (COQ10 PO) Take by mouth daily.    Marland Kitchen EPIPEN 2-PAK 0.3 MG/0.3ML SOAJ injection as needed.    . fish oil-omega-3 fatty acids 1000 MG capsule Take 2  g by mouth daily.    . Fluticasone Furoate (FLONASE SENSIMIST NA) Place into the nose.    . ibuprofen (ADVIL,MOTRIN) 200 MG tablet Take 200 mg by mouth as directed.      Marland Kitchen levocetirizine (XYZAL) 5 MG tablet Take 5 mg by mouth every evening.    . montelukast (SINGULAIR) 10 MG tablet Take 10 mg by mouth daily.    . multivitamin (THERAGRAN) per tablet Take 1 tablet by mouth daily.      Marland Kitchen neomycin-polymyxin-hydrocortisone (CORTISPORIN) OTIC solution Place 3 drops into the left ear 3 (three) times daily as needed. 10 mL 0  . polyethylene glycol (MIRALAX) powder Take 17 g by mouth as needed.     . pravastatin (PRAVACHOL) 40 MG tablet TAKE 1 TABLET(40  MG) BY MOUTH DAILY 90 tablet 1   No current facility-administered medications for this visit.     Allergies as of 01/25/2019  . (No Known Allergies)    Family History  Problem Relation Age of Onset  . Arthritis Mother   . Hypertension Mother   . Hyperlipidemia Mother   . Atrial fibrillation Mother   . Heart disease Mother   . Diabetes Mother   . Colon polyps Mother   . Hyperlipidemia Father   . Heart disease Father        s/p CABG  . Stroke Father   . Rheumatic fever Sister        s/p valve replacement  . Deep vein thrombosis Brother        after immobilization   . Colon polyps Brother   . Heart disease Brother   . Diabetes Maternal Grandfather   . Heart disease Brother   . Diabetes Maternal Aunt   . Diabetes Maternal Uncle   . Breast cancer Neg Hx     Social History   Socioeconomic History  . Marital status: Single    Spouse name: Not on file  . Number of children: 2  . Years of education: Not on file  . Highest education level: Not on file  Occupational History  . Occupation: retired    Comment: Chief of Staff   Social Needs  . Financial resource strain: Not on file  . Food insecurity:    Worry: Not on file    Inability: Not on file  . Transportation needs:    Medical: Not on file    Non-medical: Not on file  Tobacco Use  . Smoking status: Never Smoker  . Smokeless tobacco: Never Used  Substance and Sexual Activity  . Alcohol use: No    Alcohol/week: 0.0 standard drinks  . Drug use: No  . Sexual activity: Not on file  Lifestyle  . Physical activity:    Days per week: Not on file    Minutes per session: Not on file  . Stress: Not on file  Relationships  . Social connections:    Talks on phone: Not on file    Gets together: Not on file    Attends religious service: Not on file    Active member of club or organization: Not on file    Attends meetings of clubs or organizations: Not on file    Relationship status: Not on file  .  Intimate partner violence:    Fear of current or ex partner: Not on file    Emotionally abused: Not on file    Physically abused: Not on file    Forced sexual activity: Not on file  Other Topics Concern  .  Not on file  Social History Narrative   No regular exercise   Lives in noisy environment- very stressful    Daily caffeine use      Physical Exam: BP (!) 142/80 (BP Location: Left Arm, Patient Position: Sitting, Cuff Size: Normal)   Pulse 76   Ht 5\' 3"  (1.6 m) Comment: height measured without shoes  Wt 179 lb 2 oz (81.3 kg)   LMP 12/12/1981   BMI 31.73 kg/m  Constitutional: generally well-appearing Psychiatric: alert and oriented x3 Eyes: extraocular movements intact Mouth: oral pharynx moist, no lesions Neck: supple no lymphadenopathy Cardiovascular: heart regular rate and rhythm Lungs: clear to auscultation bilaterally Abdomen: soft, nontender, nondistended, no obvious ascites, no peritoneal signs, normal bowel sounds Extremities: no lower extremity edema bilaterally Skin: no lesions on visible extremities   Assessment and plan: 68 y.o. female with chronic intermittent left lower quadrant pains  I think that her pains are likely functional in nature.  Possibly she has diverticulosis related discomforts.  No evidence of actual diverticulitis clinically or radiographically ever.  She had left-sided diverticulosis on 2010 colonoscopy.  I tried to reassure her that there is unlikely anything serious going on but I did recommend a colonoscopy for routine risk colon cancer screening since she has not had one in 10 years now.  I see no reason for any further blood tests or imaging studies prior to then.   Please see the "Patient Instructions" section for addition details about the plan.   Owens Loffler, MD [facility] 01/25/2019, 10:25 AM  Cc: [name], MD

## 2019-01-29 ENCOUNTER — Telehealth: Payer: Self-pay | Admitting: Radiology

## 2019-01-29 NOTE — Telephone Encounter (Signed)
Pt coming in for labs tomorrow, please place future orders. Thank you.  

## 2019-01-30 ENCOUNTER — Other Ambulatory Visit: Payer: Self-pay | Admitting: Internal Medicine

## 2019-01-30 ENCOUNTER — Other Ambulatory Visit (INDEPENDENT_AMBULATORY_CARE_PROVIDER_SITE_OTHER): Payer: Medicare HMO

## 2019-01-30 DIAGNOSIS — R7989 Other specified abnormal findings of blood chemistry: Secondary | ICD-10-CM

## 2019-01-30 LAB — TSH: TSH: 1.5 u[IU]/mL (ref 0.35–4.50)

## 2019-01-30 NOTE — Telephone Encounter (Signed)
Order placed for f/u tsh.  

## 2019-01-30 NOTE — Progress Notes (Signed)
Order placed for f/u tsh.  

## 2019-01-31 ENCOUNTER — Encounter: Payer: Self-pay | Admitting: Internal Medicine

## 2019-01-31 DIAGNOSIS — J3089 Other allergic rhinitis: Secondary | ICD-10-CM | POA: Diagnosis not present

## 2019-02-08 ENCOUNTER — Ambulatory Visit (INDEPENDENT_AMBULATORY_CARE_PROVIDER_SITE_OTHER): Payer: Medicare HMO | Admitting: Family Medicine

## 2019-02-08 ENCOUNTER — Encounter: Payer: Self-pay | Admitting: Family Medicine

## 2019-02-08 VITALS — BP 148/82 | HR 80 | Temp 99.0°F | Resp 16 | Ht 64.0 in | Wt 178.8 lb

## 2019-02-08 DIAGNOSIS — J101 Influenza due to other identified influenza virus with other respiratory manifestations: Secondary | ICD-10-CM

## 2019-02-08 DIAGNOSIS — R52 Pain, unspecified: Secondary | ICD-10-CM

## 2019-02-08 DIAGNOSIS — R059 Cough, unspecified: Secondary | ICD-10-CM

## 2019-02-08 DIAGNOSIS — R05 Cough: Secondary | ICD-10-CM

## 2019-02-08 DIAGNOSIS — J029 Acute pharyngitis, unspecified: Secondary | ICD-10-CM | POA: Diagnosis not present

## 2019-02-08 LAB — POC INFLUENZA A&B (BINAX/QUICKVUE)
Influenza A, POC: POSITIVE — AB
Influenza B, POC: NEGATIVE

## 2019-02-08 LAB — POCT RAPID STREP A (OFFICE): Rapid Strep A Screen: NEGATIVE

## 2019-02-08 MED ORDER — OSELTAMIVIR PHOSPHATE 75 MG PO CAPS: 75.0000 mg | ORAL_CAPSULE | Freq: Two times a day (BID) | ORAL | 0 refills | Status: DC

## 2019-02-08 MED ORDER — HYDROCOD POLST-CPM POLST ER 10-8 MG/5ML PO SUER: 5.0000 mL | Freq: Two times a day (BID) | ORAL | 0 refills | Status: DC | PRN

## 2019-02-08 NOTE — Progress Notes (Signed)
Subjective:    Patient ID: Jill Torres, female    DOB: [birth year], 68 y.o.   MRN: [id number]  HPI  Patient Active Problem List   Diagnosis Date Noted  . Arthralgia 09/14/2017  . Rash 06/03/2016  . Hypercholesterolemia 03/05/2016  . Health care maintenance 10/21/2015  . Environmental allergies 09/07/2014  . HTN (hypertension) 08/18/2013  . Right carotid bruit 08/18/2013  . Anxiety 07/17/2013  . GERD (gastroesophageal reflux disease) 06/16/2013  . Breast cancer screening 04/18/2011  . DEVIATED NASAL SEPTUM 09/09/2010  . COLONIC POLYPS, HYPERPLASTIC, HX OF 03/27/2009  . Hypothyroidism 02/19/2009  . DIVERTICULOSIS, COLON 02/19/2009  . IRRITABLE BOWEL SYNDROME 02/19/2009   Social History   Tobacco Use  . Smoking status: Never Smoker  . Smokeless tobacco: Never Used  Substance Use Topics  . Alcohol use: No    Alcohol/week: 0.0 standard drinks   Review of Systems  Constitutional: +chills, fatigue and fever.  HENT: +congestion, ear pain, sinus pain and sore throat.   Eyes: Negative.   Respiratory: +cough. Negative for shortness of breath and wheezing.   Cardiovascular: Negative for chest pain, palpitations and leg swelling.  Gastrointestinal: Negative for abdominal pain, diarrhea, nausea and vomiting.  Genitourinary: Negative for dysuria, frequency and urgency.  Musculoskeletal: +body aches Skin: Negative for color change, pallor and rash.  Neurological: Negative for syncope, light-headedness and headaches.  Psychiatric/Behavioral: The patient is not nervous/anxious.       Objective:   Physical Exam Vitals signs and nursing note reviewed.  Constitutional:      General: She is not in acute distress.    Appearance: She is ill-appearing (appears tired). She is not toxic-appearing.  HENT:     Head: Normocephalic and atraumatic.     Ears:     Comments: +fullness bilat TMs    Nose: Congestion and rhinorrhea present.     Mouth/Throat:     Mouth: Mucous membranes  are moist.     Pharynx: Posterior oropharyngeal erythema (+post nasal drip) present. No oropharyngeal exudate.  Eyes:     General: No scleral icterus.       Right eye: No discharge.        Left eye: No discharge.     Extraocular Movements: Extraocular movements intact.     Conjunctiva/sclera: Conjunctivae normal.  Neck:     Musculoskeletal: Neck supple. No neck rigidity.  Cardiovascular:     Rate and Rhythm: Normal rate and regular rhythm.  Pulmonary:     Effort: Pulmonary effort is normal. No respiratory distress.     Breath sounds: Normal breath sounds. No wheezing, rhonchi or rales.  Abdominal:     General: Bowel sounds are normal.     Palpations: Abdomen is soft.     Tenderness: There is no abdominal tenderness.  Lymphadenopathy:     Cervical: No cervical adenopathy.  Skin:    General: Skin is warm and dry.  Neurological:     Mental Status: She is alert and oriented to person, place, and time.  Psychiatric:        Mood and Affect: Mood normal.        Behavior: Behavior normal.    Vitals:   02/08/19 0816  BP: (!) 148/82  Pulse: 80  Resp: 16  Temp: 99 F (37.2 C)  SpO2: 96%      Assessment & Plan:    Influenza A, sore throat, cough, body aches - patient's point-of-care flu testing is positive for influenza A in clinic.  Rapid strep  is negative. Advised: You are considered contagious from 5 to 7 days of symptom on set, stay home as much as possible throughout the next week.  Get plenty of rest, do good handwashing and keep up good fluid intake.  Alternate Tylenol and Motrin as needed for any aches or fever.  Use Mucinex during the day for cough, Tussionex cough syrup at night.  Tussionex syrup can cause drowsiness so do not take prior to driving.  Take Tamiflu twice a day for the next 5 days to treat flu.  Patient advised to keep regularly scheduled follow-up with PCP as planned.  Advised that if her symptoms worsen, she develops breathing issues, chest pain, faintness,  dizziness to call on-call/office right away and/or go to emergency room for evaluation.

## 2019-02-08 NOTE — Patient Instructions (Signed)
You are considered contagious from 5 to 7 days of symptom on set, stay home as much as possible throughout the next week.  Get plenty of rest, do good handwashing and keep up good fluid intake.  Alternate Tylenol and Motrin as needed for any aches or fever.  Use Mucinex during the day for cough, Tussionex cough syrup at night.  Tussionex syrup can cause drowsiness so do not take prior to driving.  Take Tamiflu twice a day for the next 5 days to treat flu.  Influenza, Adult Influenza is also called "the flu." It is an infection in the lungs, nose, and throat (respiratory tract). It is caused by a virus. The flu causes symptoms that are similar to symptoms of a cold. It also causes a high fever and body aches. The flu spreads easily from person to person (is contagious). Getting a flu shot (influenza vaccination) every year is the best way to prevent the flu. What are the causes? This condition is caused by the influenza virus. You can get the virus by:  Breathing in droplets that are in the air from the cough or sneeze of a person who has the virus.  Touching something that has the virus on it (is contaminated) and then touching your mouth, nose, or eyes. What increases the risk? Certain things may make you more likely to get the flu. These include:  Not washing your hands often.  Having close contact with many people during cold and flu season.  Touching your mouth, eyes, or nose without first washing your hands.  Not getting a flu shot every year. You may have a higher risk for the flu, along with serious problems such as a lung infection (pneumonia), if you:  Are older than 65.  Are pregnant.  Have a weakened disease-fighting system (immune system) because of a disease or taking certain medicines.  Have a long-term (chronic) illness, such as: ? Heart, kidney, or lung disease. ? Diabetes. ? Asthma.  Have a liver disorder.  Are very overweight (morbidly obese).  Have anemia.  This is a condition that affects your red blood cells. What are the signs or symptoms? Symptoms usually begin suddenly and last 4-14 days. They may include:  Fever and chills.  Headaches, body aches, or muscle aches.  Sore throat.  Cough.  Runny or stuffy (congested) nose.  Chest discomfort.  Not wanting to eat as much as normal (poor appetite).  Weakness or feeling tired (fatigue).  Dizziness.  Feeling sick to your stomach (nauseous) or throwing up (vomiting). How is this treated? If the flu is found early, you can be treated with medicine that can help reduce how bad the illness is and how long it lasts (antiviral medicine). This may be given by mouth (orally) or through an IV tube. Taking care of yourself at home can help your symptoms get better. Your doctor may suggest:  Taking over-the-counter medicines.  Drinking plenty of fluids. The flu often goes away on its own. If you have very bad symptoms or other problems, you may be treated in a hospital. Follow these instructions at home:     Activity  Rest as needed. Get plenty of sleep.  Stay home from work or school as told by your doctor. ? Do not leave home until you do not have a fever for 24 hours without taking medicine. ? Leave home only to visit your doctor. Eating and drinking  Take an ORS (oral rehydration solution). This is a drink that  is sold at pharmacies and stores.  Drink enough fluid to keep your pee (urine) pale yellow.  Drink clear fluids in small amounts as you are able. Clear fluids include: ? Water. ? Ice chips. ? Fruit juice that has water added (diluted fruit juice). ? Low-calorie sports drinks.  Eat bland, easy-to-digest foods in small amounts as you are able. These foods include: ? Bananas. ? Applesauce. ? Rice. ? Lean meats. ? Toast. ? Crackers.  Do not eat or drink: ? Fluids that have a lot of sugar or caffeine. ? Alcohol. ? Spicy or fatty foods. General  instructions  Take over-the-counter and prescription medicines only as told by your doctor.  Use a cool mist humidifier to add moisture to the air in your home. This can make it easier for you to breathe.  Cover your mouth and nose when you cough or sneeze.  Wash your hands with soap and water often, especially after you cough or sneeze. If you cannot use soap and water, use alcohol-based hand sanitizer.  Keep all follow-up visits as told by your doctor. This is important. How is this prevented?   Get a flu shot every year. You may get the flu shot in late summer, fall, or winter. Ask your doctor when you should get your flu shot.  Avoid contact with people who are sick during fall and winter (cold and flu season). Contact a doctor if:  You get new symptoms.  You have: ? Chest pain. ? Watery poop (diarrhea). ? A fever.  Your cough gets worse.  You start to have more mucus.  You feel sick to your stomach.  You throw up. Get help right away if you:  Have shortness of breath.  Have trouble breathing.  Have skin or nails that turn a bluish color.  Have very bad pain or stiffness in your neck.  Get a sudden headache.  Get sudden pain in your face or ear.  Cannot eat or drink without throwing up. Summary  Influenza ("the flu") is an infection in the lungs, nose, and throat. It is caused by a virus.  Take over-the-counter and prescription medicines only as told by your doctor.  Getting a flu shot every year is the best way to avoid getting the flu. This information is not intended to replace advice given to you by your health care provider. Make sure you discuss any questions you have with your health care provider. Document Released: 09/06/2008 Document Revised: 05/16/2018 Document Reviewed: 05/16/2018 Elsevier Interactive Patient Education  2019 [employer].

## 2019-02-22 ENCOUNTER — Other Ambulatory Visit: Payer: Self-pay

## 2019-02-22 ENCOUNTER — Encounter: Payer: Self-pay | Admitting: Gastroenterology

## 2019-02-22 ENCOUNTER — Ambulatory Visit (AMBULATORY_SURGERY_CENTER): Payer: Medicare HMO | Admitting: Gastroenterology

## 2019-02-22 VITALS — BP 130/70 | HR 74 | Temp 99.1°F | Resp 14 | Wt 179.0 lb

## 2019-02-22 DIAGNOSIS — Z1211 Encounter for screening for malignant neoplasm of colon: Secondary | ICD-10-CM | POA: Diagnosis not present

## 2019-02-22 DIAGNOSIS — K5730 Diverticulosis of large intestine without perforation or abscess without bleeding: Secondary | ICD-10-CM

## 2019-02-22 DIAGNOSIS — R1032 Left lower quadrant pain: Secondary | ICD-10-CM

## 2019-02-22 DIAGNOSIS — D120 Benign neoplasm of cecum: Secondary | ICD-10-CM

## 2019-02-22 DIAGNOSIS — K635 Polyp of colon: Secondary | ICD-10-CM

## 2019-02-22 MED ORDER — SODIUM CHLORIDE 0.9 % IV SOLN: 500.0000 mL | Freq: Once | INTRAVENOUS | Status: DC

## 2019-02-22 NOTE — Patient Instructions (Signed)
Information on polyps and diverticulosis given to you today.  Await pathology results.  YOU HAD AN ENDOSCOPIC PROCEDURE TODAY AT THE [facility]:   Refer to the procedure report that was given to you for any specific questions about what was found during the examination.  If the procedure report does not answer your questions, please call your gastroenterologist to clarify.  If you requested that your care partner not be given the details of your procedure findings, then the procedure report has been included in a sealed envelope for you to review at your convenience later.  YOU SHOULD EXPECT: Some feelings of bloating in the abdomen. Passage of more gas than usual.  Walking can help get rid of the air that was put into your GI tract during the procedure and reduce the bloating. If you had a lower endoscopy (such as a colonoscopy or flexible sigmoidoscopy) you may notice spotting of blood in your stool or on the toilet paper. If you underwent a bowel prep for your procedure, you may not have a normal bowel movement for a few days.  Please Note:  You might notice some irritation and congestion in your nose or some drainage.  This is from the oxygen used during your procedure.  There is no need for concern and it should clear up in a day or so.  SYMPTOMS TO REPORT IMMEDIATELY:   Following lower endoscopy (colonoscopy or flexible sigmoidoscopy):  Excessive amounts of blood in the stool  Significant tenderness or worsening of abdominal pains  Swelling of the abdomen that is new, acute  Fever of 100F or higher  For urgent or emergent issues, a gastroenterologist can be reached at any hour by calling [phone].   DIET:  We do recommend a small meal at first, but then you may proceed to your regular diet.  Drink plenty of fluids but you should avoid alcoholic beverages for 24 hours.  ACTIVITY:  You should plan to take it easy for the rest of today and you should NOT DRIVE or use  heavy machinery until tomorrow (because of the sedation medicines used during the test).    FOLLOW UP: Our staff will call the number listed on your records the next business day following your procedure to check on you and address any questions or concerns that you may have regarding the information given to you following your procedure. If we do not reach you, we will leave a message.  However, if you are feeling well and you are not experiencing any problems, there is no need to return our call.  We will assume that you have returned to your regular daily activities without incident.  If any biopsies were taken you will be contacted by phone or by letter within the next 1-3 weeks.  Please call us at [phone] if you have not heard about the biopsies in 3 weeks.    SIGNATURES/CONFIDENTIALITY: You and/or your care partner have signed paperwork which will be entered into your electronic medical record.  These signatures attest to the fact that that the information above on your After Visit Summary has been reviewed and is understood.  Full responsibility of the confidentiality of this discharge information lies with you and/or your care-partner. 

## 2019-02-22 NOTE — Progress Notes (Signed)
Called to room to assist during endoscopic procedure.  Patient ID and intended procedure confirmed with present staff. Received instructions for my participation in the procedure from the performing physician.  

## 2019-02-22 NOTE — Progress Notes (Signed)
Pt diagnosis with type A flu on 02-08-19, no symptoms today

## 2019-02-22 NOTE — Op Note (Signed)
[place] Patient Name: Jill Torres Procedure Date: 02/22/2019 1:18 PM MRN: [id number] Endoscopist: Milus Banister , MD Age: 68 Referring MD:  Date of Birth: Mar 18, 1951 Gender: Female Account #: [id number] Procedure:                Colonoscopy Indications:              Screening for colorectal malignant neoplasm Medicines:                Monitored Anesthesia Care Procedure:                Pre-Anesthesia Assessment:                           - Prior to the procedure, a History and Physical                            was performed, and patient medications and                            allergies were reviewed. The patient's tolerance of                            previous anesthesia was also reviewed. The risks                            and benefits of the procedure and the sedation                            options and risks were discussed with the patient.                            All questions were answered, and informed consent                            was obtained. Prior Anticoagulants: The patient has                            taken no previous anticoagulant or antiplatelet                            agents. ASA Grade Assessment: II - A patient with                            mild systemic disease. After reviewing the risks                            and benefits, the patient was deemed in                            satisfactory condition to undergo the procedure.                           After obtaining informed consent, the colonoscope  was passed under direct vision. Throughout the                            procedure, the patient's blood pressure, pulse, and                            oxygen saturations were monitored continuously. The                            Colonoscope was introduced through the anus and                            advanced to the the cecum, identified by                            appendiceal orifice and  ileocecal valve. The                            colonoscopy was performed without difficulty. The                            patient tolerated the procedure well. The quality                            of the bowel preparation was good. The ileocecal                            valve, appendiceal orifice, and rectum were                            photographed. Scope In: 1:25:25 PM Scope Out: 1:39:07 PM Scope Withdrawal Time: 0 hours 7 minutes 20 seconds  Total Procedure Duration: 0 hours 13 minutes 42 seconds  Findings:                 A 2 mm polyp was found in the cecum. The polyp was                            sessile. The polyp was removed with a cold snare.                            Resection and retrieval were complete.                           Multiple small and large-mouthed diverticula were                            found in the left colon. There was narrowing,                            erythema and tortuousity of the colon lumen in the                            region of the diverticulosis.  The exam was otherwise without abnormality on                            direct and retroflexion views. Complications:            No immediate complications. Estimated blood loss:                            None. Estimated Blood Loss:     Estimated blood loss: none. Impression:               - One 2 mm polyp in the cecum, removed with a cold                            snare. Resected and retrieved.                           - Diverticulosis in the left colon.                           - The examination was otherwise normal on direct                            and retroflexion views. Recommendation:           - Patient has a contact number available for                            emergencies. The signs and symptoms of potential                            delayed complications were discussed with the                            patient. Return to normal activities  tomorrow.                            Written discharge instructions were provided to the                            patient.                           - Resume previous diet.                           - Continue present medications.                           You will receive a letter within 2-3 weeks with the                            pathology results and my final recommendations.                           If the polyp(s) is proven to be 'pre-cancerous' on  pathology, you will need repeat colonoscopy in 7                            years. If the polyp(s) is NOT 'precancerous' on                            pathology then you should repeat colon cancer                            screening in 10 years with colonoscopy without need                            for colon cancer screening by any method prior to                            then (including stool testing). Milus Banister, MD 02/22/2019 1:42:23 PM This report has been signed electronically.

## 2019-02-22 NOTE — Progress Notes (Signed)
PT taken to PACU. Monitors in place. VSS. Report given to RN. 

## 2019-02-25 ENCOUNTER — Telehealth: Payer: Self-pay | Admitting: *Deleted

## 2019-02-25 NOTE — Telephone Encounter (Signed)
  Follow up Call-  Call back number 02/22/2019  Post procedure Call Back phone  # [phone]  Permission to leave phone message Yes  Some recent data might be hidden     Patient questions:  Do you have a fever, pain , or abdominal swelling? No. Pain Score  0 *  Have you tolerated food without any problems? Yes.    Have you been able to return to your normal activities? Yes.    Do you have any questions about your discharge instructions: Diet   No. Medications  No. Follow up visit  No.  Do you have questions or concerns about your Care? No.  Actions: * If pain score is 4 or above: No action needed, pain <4.

## 2019-02-27 ENCOUNTER — Other Ambulatory Visit: Payer: Self-pay | Admitting: Internal Medicine

## 2019-02-27 DIAGNOSIS — R030 Elevated blood-pressure reading, without diagnosis of hypertension: Secondary | ICD-10-CM

## 2019-02-28 ENCOUNTER — Encounter: Payer: Self-pay | Admitting: Gastroenterology

## 2019-03-25 ENCOUNTER — Other Ambulatory Visit: Payer: Self-pay | Admitting: Internal Medicine

## 2019-04-19 ENCOUNTER — Ambulatory Visit: Payer: Medicare HMO | Admitting: Internal Medicine

## 2019-05-26 ENCOUNTER — Other Ambulatory Visit: Payer: Self-pay | Admitting: Internal Medicine

## 2019-05-26 DIAGNOSIS — R030 Elevated blood-pressure reading, without diagnosis of hypertension: Secondary | ICD-10-CM

## 2019-07-25 ENCOUNTER — Other Ambulatory Visit: Payer: Self-pay

## 2019-07-26 ENCOUNTER — Ambulatory Visit (INDEPENDENT_AMBULATORY_CARE_PROVIDER_SITE_OTHER): Payer: Medicare HMO | Admitting: Internal Medicine

## 2019-07-26 ENCOUNTER — Other Ambulatory Visit: Payer: Self-pay

## 2019-07-26 ENCOUNTER — Encounter: Payer: Self-pay | Admitting: Internal Medicine

## 2019-07-26 VITALS — BP 130/72 | HR 77 | Temp 97.7°F | Resp 16 | Wt 177.0 lb

## 2019-07-26 DIAGNOSIS — I10 Essential (primary) hypertension: Secondary | ICD-10-CM | POA: Diagnosis not present

## 2019-07-26 DIAGNOSIS — R1084 Generalized abdominal pain: Secondary | ICD-10-CM | POA: Diagnosis not present

## 2019-07-26 DIAGNOSIS — E2839 Other primary ovarian failure: Secondary | ICD-10-CM

## 2019-07-26 DIAGNOSIS — F419 Anxiety disorder, unspecified: Secondary | ICD-10-CM

## 2019-07-26 DIAGNOSIS — Z9109 Other allergy status, other than to drugs and biological substances: Secondary | ICD-10-CM | POA: Diagnosis not present

## 2019-07-26 DIAGNOSIS — R69 Illness, unspecified: Secondary | ICD-10-CM | POA: Diagnosis not present

## 2019-07-26 DIAGNOSIS — Z23 Encounter for immunization: Secondary | ICD-10-CM | POA: Diagnosis not present

## 2019-07-26 DIAGNOSIS — E7800 Pure hypercholesterolemia, unspecified: Secondary | ICD-10-CM | POA: Diagnosis not present

## 2019-07-26 DIAGNOSIS — K219 Gastro-esophageal reflux disease without esophagitis: Secondary | ICD-10-CM

## 2019-07-26 DIAGNOSIS — R109 Unspecified abdominal pain: Secondary | ICD-10-CM | POA: Diagnosis not present

## 2019-07-26 DIAGNOSIS — H60501 Unspecified acute noninfective otitis externa, right ear: Secondary | ICD-10-CM

## 2019-07-26 MED ORDER — NEOMYCIN-POLYMYXIN-HC 3.5-10000-1 OT SOLN: 3.0000 [drp] | Freq: Three times a day (TID) | OTIC | 0 refills | Status: DC | PRN

## 2019-07-26 NOTE — Progress Notes (Signed)
Patient ID: ANNALIE WENNER, female   DOB: October 23, 1951, 68 y.o.   MRN: [id number]   Subjective:    Patient ID: [name], female    DOB: 1951/03/10, 68 y.o.   MRN: [id number]  HPI  Patient here for a scheduled follow up.  Reports she is doing relatively well.  Trying to stay in due to covid restrictions.  No fever.  No chest congestion, cough or sob.  No chest pain.  No acid reflux.  Reports persistent abdominal discomfort.  Had colonoscopy 03/2019 - single subcentimeter polyp.  Negative for microscopic colitis.  Recommended f/u in 10 years.  No significant bowel change.  Blood pressure doing well.  Taking pravastatin.  Right ear irritated.  No pain.  No hearing change.     Past Medical History:  Diagnosis Date  . Allergy   . Arthritis   . Depression   . Diverticulosis of colon (without mention of hemorrhage)   . GERD (gastroesophageal reflux disease)   . History of chicken pox   . History of shingles may 2013  . HLD (hyperlipidemia)   . HTN (hypertension)   . Irritable bowel syndrome   . Unspecified hypothyroidism    Past Surgical History:  Procedure Laterality Date  . ABDOMINAL HYSTERECTOMY  1983   due to heavy bleeding & ovary cyst  . APPENDECTOMY  1974  . BLADDER DIVERTICULECTOMY  1990  . CESAREAN SECTION     x 2  . CHOLECYSTECTOMY    . DILATION AND CURETTAGE OF UTERUS     x 2  . DIRECT LARYNGOSCOPY Right 06/11/2015   Procedure: suspension microdirect laryngoscopy with biopsy of right tongue base;  Surgeon: Carloyn Manner, MD;  Location: [facility] ORS;  Service: ENT;  Laterality: Right;  . NASAL SEPTUM SURGERY  1999  . PARTIAL HYSTERECTOMY  1983   cyst on ovary and heavy bleeding    Family History  Problem Relation Age of Onset  . Arthritis Mother   . Hypertension Mother   . Hyperlipidemia Mother   . Atrial fibrillation Mother   . Heart disease Mother   . Diabetes Mother   . Colon polyps Mother   . Hyperlipidemia Father   . Heart disease Father        s/p CABG   . Stroke Father   . Rheumatic fever Sister        s/p valve replacement  . Deep vein thrombosis Brother        after immobilization   . Colon polyps Brother   . Heart disease Brother   . Diabetes Maternal Grandfather   . Heart disease Brother   . Diabetes Maternal Aunt   . Diabetes Maternal Uncle   . Breast cancer Neg Hx   . Colon cancer Neg Hx   . Esophageal cancer Neg Hx   . Rectal cancer Neg Hx   . Stomach cancer Neg Hx    Social History   Socioeconomic History  . Marital status: Single    Spouse name: Not on file  . Number of children: 2  . Years of education: Not on file  . Highest education level: Not on file  Occupational History  . Occupation: retired    Comment: Chief of Staff   Social Needs  . Financial resource strain: Not on file  . Food insecurity    Worry: Not on file    Inability: Not on file  . Transportation needs    Medical: Not on file    Non-medical:  Not on file  Tobacco Use  . Smoking status: Never Smoker  . Smokeless tobacco: Never Used  Substance and Sexual Activity  . Alcohol use: No    Alcohol/week: 0.0 standard drinks  . Drug use: No  . Sexual activity: Not on file  Lifestyle  . Physical activity    Days per week: Not on file    Minutes per session: Not on file  . Stress: Not on file  Relationships  . Social Herbalist on phone: Not on file    Gets together: Not on file    Attends religious service: Not on file    Active member of club or organization: Not on file    Attends meetings of clubs or organizations: Not on file    Relationship status: Not on file  Other Topics Concern  . Not on file  Social History Narrative   No regular exercise   Lives in noisy environment- very stressful    Daily caffeine use     Outpatient Encounter Medications as of 07/26/2019  Medication Sig  . AMBULATORY NON FORMULARY MEDICATION Allergy shots once a week  . amLODipine (NORVASC) 2.5 MG tablet TAKE 1 TABLET BY MOUTH EVERY  DAY  . azelastine (ASTELIN) 0.1 % nasal spray PLACE 1 SPRAY INTO BOTH NOSTRILS 2 (TWO) TIMES DAILY. USE IN EACH NOSTRIL AS DIRECTED  . bifidobacterium infantis (ALIGN) capsule Take 1 capsule by mouth daily.    . calcium-vitamin D (OSCAL WITH D 500-200) 500-200 MG-UNIT per tablet Take 1 tablet by mouth daily.    . Coenzyme Q10 (COQ10 PO) Take by mouth daily.  . fish oil-omega-3 fatty acids 1000 MG capsule Take 2 g by mouth daily.  . Fluticasone Furoate (FLONASE SENSIMIST NA) Place into the nose.  . ibuprofen (ADVIL,MOTRIN) 200 MG tablet Take 200 mg by mouth as directed.    . multivitamin (THERAGRAN) per tablet Take 1 tablet by mouth daily.    Marland Kitchen neomycin-polymyxin-hydrocortisone (CORTISPORIN) OTIC solution Place 3 drops into the left ear 3 (three) times daily as needed.  . polyethylene glycol (MIRALAX) powder Take 17 g by mouth as needed.   . pravastatin (PRAVACHOL) 40 MG tablet TAKE 1 TABLET(40 MG) BY MOUTH DAILY  . [DISCONTINUED] chlorpheniramine-HYDROcodone (TUSSIONEX PENNKINETIC ER) 10-8 MG/5ML SUER Take 5 mLs by mouth every 12 (twelve) hours as needed. (Patient not taking: Reported on 02/22/2019)  . [DISCONTINUED] EPIPEN 2-PAK 0.3 MG/0.3ML SOAJ injection as needed.  . [DISCONTINUED] levocetirizine (XYZAL) 5 MG tablet Take 5 mg by mouth every evening.  . [DISCONTINUED] montelukast (SINGULAIR) 10 MG tablet Take 10 mg by mouth daily.  . [DISCONTINUED] neomycin-polymyxin-hydrocortisone (CORTISPORIN) OTIC solution Place 3 drops into the left ear 3 (three) times daily as needed. (Patient not taking: Reported on 02/22/2019)  . [DISCONTINUED] neomycin-polymyxin-hydrocortisone (CORTISPORIN) OTIC solution Place 3 drops into the left ear 3 (three) times daily as needed.  . [DISCONTINUED] polyethylene glycol-electrolytes (NULYTELY/GOLYTELY) 420 g solution Take 4,000 mLs by mouth as directed. (Patient not taking: Reported on 02/22/2019)   No facility-administered encounter medications on file as of 07/26/2019.      Review of Systems  Constitutional: Negative for appetite change and unexpected weight change.  HENT: Negative for congestion, sinus pressure and sore throat.        Ear inflammation.    Eyes: Negative for pain and visual disturbance.  Respiratory: Negative for cough, chest tightness and shortness of breath.   Cardiovascular: Negative for chest pain, palpitations and leg swelling.  Gastrointestinal:  Positive for abdominal pain. Negative for diarrhea, nausea and vomiting.  Genitourinary: Negative for difficulty urinating and dysuria.  Musculoskeletal: Negative for joint swelling and myalgias.  Skin: Negative for color change and rash.  Neurological: Negative for dizziness, light-headedness and headaches.  Hematological: Negative for adenopathy. Does not bruise/bleed easily.  Psychiatric/Behavioral: Negative for agitation and dysphoric mood.       Objective:    Physical Exam Constitutional:      General: She is not in acute distress.    Appearance: Normal appearance.  HENT:     Right Ear: External ear normal.     Left Ear: External ear normal.     Ears:     Comments: Right ear canal - minimal erythema.  Eyes:     General: No scleral icterus.       Right eye: No discharge.        Left eye: No discharge.     Conjunctiva/sclera: Conjunctivae normal.  Neck:     Musculoskeletal: Neck supple. No muscular tenderness.     Thyroid: No thyromegaly.  Cardiovascular:     Rate and Rhythm: Normal rate and regular rhythm.  Pulmonary:     Effort: No respiratory distress.     Breath sounds: Normal breath sounds. No wheezing.  Abdominal:     General: Bowel sounds are normal.     Palpations: Abdomen is soft.     Tenderness: There is no abdominal tenderness.  Musculoskeletal:        General: No swelling or tenderness.  Lymphadenopathy:     Cervical: No cervical adenopathy.  Skin:    Findings: No erythema or rash.  Neurological:     Mental Status: She is alert.  Psychiatric:         Mood and Affect: Mood normal.        Behavior: Behavior normal.     BP 130/72   Pulse 77   Temp 97.7 F (36.5 C) (Temporal)   Resp 16   Wt 177 lb (80.3 kg)   LMP 12/12/1981   SpO2 97%   BMI 30.38 kg/m  Wt Readings from Last 3 Encounters:  07/26/19 177 lb (80.3 kg)  02/22/19 179 lb (81.2 kg)  02/08/19 178 lb 12.8 oz (81.1 kg)     Lab Results  Component Value Date   WBC 9.3 12/18/2018   HGB 13.7 12/18/2018   HCT 41.4 12/18/2018   PLT 366.0 12/18/2018   GLUCOSE 95 12/18/2018   CHOL 189 12/18/2018   TRIG 59.0 12/18/2018   HDL 70.10 12/18/2018   LDLDIRECT 155.8 06/27/2013   LDLCALC 107 (H) 12/18/2018   ALT 12 12/18/2018   AST 14 12/18/2018   NA 138 12/18/2018   K 4.1 12/19/2018   CL 103 12/18/2018   CREATININE 0.81 12/18/2018   BUN 12 12/18/2018   CO2 28 12/18/2018   TSH 1.50 01/30/2019    Mm 3d Screen Breast Bilateral  Result Date: 10/24/2018 CLINICAL DATA:  Screening. EXAM: DIGITAL SCREENING BILATERAL MAMMOGRAM WITH TOMO AND CAD COMPARISON:  Previous exam(s). ACR Breast Density Category b: There are scattered areas of fibroglandular density. FINDINGS: There are no findings suspicious for malignancy. Images were processed with CAD. IMPRESSION: No mammographic evidence of malignancy. A result letter of this screening mammogram will be mailed directly to the patient. RECOMMENDATION: Screening mammogram in one year. (Code:SM-B-01Y) BI-RADS CATEGORY  1: Negative. Electronically Signed   By: Lillia Mountain M.D.   On: 10/24/2018 14:16       Assessment &  Plan:   Problem List Items Addressed This Visit    Abdominal pain    Persistent abdominal pain.  Colonoscopy as outlined.  Schedule CT.  No acid reflux.  Bowels stable.        Relevant Orders   CT Abdomen Pelvis W Contrast   Anxiety    Doing well on prozac.  Follow.        Environmental allergies    Controlled.        GERD (gastroesophageal reflux disease)    No upper symptoms reported.        HTN  (hypertension)    Blood pressure as outlined.  Continue current medication regimen.  Follow pressures.  Follow metabolic panel.       Relevant Orders   CBC with Differential/Platelet   Basic metabolic panel   Hypercholesterolemia    On pravastatin.  Low cholesterol diet and exercise. Follow lipid panel and liver function tests.       Relevant Orders   TSH   Lipid panel   Hepatic function panel   Otitis externa    Treat with cortisporin otic.  Follow.         Other Visit Diagnoses    Estrogen deficiency    -  Primary   Relevant Orders   DG Bone Density   Need for 23-polyvalent pneumococcal polysaccharide vaccine       Relevant Orders   Pneumococcal polysaccharide vaccine 23-valent greater than or equal to 2yo subcutaneous/IM (Completed)       [name], MD

## 2019-07-28 ENCOUNTER — Encounter: Payer: Self-pay | Admitting: Internal Medicine

## 2019-07-28 DIAGNOSIS — H6090 Unspecified otitis externa, unspecified ear: Secondary | ICD-10-CM | POA: Insufficient documentation

## 2019-07-28 NOTE — Assessment & Plan Note (Signed)
Blood pressure as outlined.  Continue current medication regimen.  Follow pressures.  Follow metabolic panel.  

## 2019-07-28 NOTE — Assessment & Plan Note (Signed)
On pravastatin.  Low cholesterol diet and exercise.  Follow lipid panel and liver function tests.   

## 2019-07-28 NOTE — Assessment & Plan Note (Signed)
Persistent abdominal pain.  Colonoscopy as outlined.  Schedule CT.  No acid reflux.  Bowels stable.

## 2019-07-28 NOTE — Assessment & Plan Note (Signed)
Controlled.  

## 2019-07-28 NOTE — Assessment & Plan Note (Signed)
Doing well on prozac.  Follow  

## 2019-07-28 NOTE — Assessment & Plan Note (Signed)
No upper symptoms reported.   

## 2019-07-28 NOTE — Assessment & Plan Note (Signed)
Treat with cortisporin otic.  Follow.

## 2019-08-08 DIAGNOSIS — R69 Illness, unspecified: Secondary | ICD-10-CM | POA: Diagnosis not present

## 2019-08-09 ENCOUNTER — Ambulatory Visit
Admission: RE | Admit: 2019-08-09 | Discharge: 2019-08-09 | Disposition: A | Discharge status: Home / Self Care | Payer: Medicare HMO | Source: Ambulatory Visit | Attending: Internal Medicine | Admitting: Internal Medicine

## 2019-08-09 ENCOUNTER — Other Ambulatory Visit: Payer: Self-pay

## 2019-08-09 DIAGNOSIS — R1084 Generalized abdominal pain: Secondary | ICD-10-CM

## 2019-08-09 DIAGNOSIS — K5730 Diverticulosis of large intestine without perforation or abscess without bleeding: Secondary | ICD-10-CM | POA: Diagnosis not present

## 2019-08-09 LAB — POCT I-STAT CREATININE: Creatinine, Ser: 1 mg/dL (ref 0.44–1.00)

## 2019-08-09 MED ORDER — IOHEXOL 300 MG/ML  SOLN
100.0000 mL | Freq: Once | INTRAMUSCULAR | Status: AC | PRN
  Administered 2019-08-09: 100 mL via INTRAVENOUS

## 2019-08-13 ENCOUNTER — Other Ambulatory Visit: Payer: Self-pay

## 2019-08-13 ENCOUNTER — Other Ambulatory Visit (INDEPENDENT_AMBULATORY_CARE_PROVIDER_SITE_OTHER): Payer: Medicare HMO

## 2019-08-13 ENCOUNTER — Ambulatory Visit (INDEPENDENT_AMBULATORY_CARE_PROVIDER_SITE_OTHER): Payer: Medicare HMO

## 2019-08-13 DIAGNOSIS — Z23 Encounter for immunization: Secondary | ICD-10-CM

## 2019-08-13 DIAGNOSIS — E7800 Pure hypercholesterolemia, unspecified: Secondary | ICD-10-CM

## 2019-08-13 DIAGNOSIS — I10 Essential (primary) hypertension: Secondary | ICD-10-CM | POA: Diagnosis not present

## 2019-08-13 LAB — HEPATIC FUNCTION PANEL
ALT: 12 U/L (ref 0–35)
AST: 12 U/L (ref 0–37)
Albumin: 4.1 g/dL (ref 3.5–5.2)
Alkaline Phosphatase: 69 U/L (ref 39–117)
Bilirubin, Direct: 0.1 mg/dL (ref 0.0–0.3)
Total Bilirubin: 0.5 mg/dL (ref 0.2–1.2)
Total Protein: 6.8 g/dL (ref 6.0–8.3)

## 2019-08-13 LAB — CBC WITH DIFFERENTIAL/PLATELET
Basophils Absolute: 0.1 10*3/uL (ref 0.0–0.1)
Basophils Relative: 1.1 % (ref 0.0–3.0)
Eosinophils Absolute: 0.1 10*3/uL (ref 0.0–0.7)
Eosinophils Relative: 1.2 % (ref 0.0–5.0)
HCT: 39.2 % (ref 36.0–46.0)
Hemoglobin: 13.3 g/dL (ref 12.0–15.0)
Lymphocytes Relative: 32.1 % (ref 12.0–46.0)
Lymphs Abs: 2.8 10*3/uL (ref 0.7–4.0)
MCHC: 33.9 g/dL (ref 30.0–36.0)
MCV: 90.8 fl (ref 78.0–100.0)
Monocytes Absolute: 0.5 10*3/uL (ref 0.1–1.0)
Monocytes Relative: 6.1 % (ref 3.0–12.0)
Neutro Abs: 5.1 10*3/uL (ref 1.4–7.7)
Neutrophils Relative %: 59.5 % (ref 43.0–77.0)
Platelets: 329 10*3/uL (ref 150.0–400.0)
RBC: 4.32 Mil/uL (ref 3.87–5.11)
RDW: 12.6 % (ref 11.5–15.5)
WBC: 8.6 10*3/uL (ref 4.0–10.5)

## 2019-08-13 LAB — BASIC METABOLIC PANEL
BUN: 13 mg/dL (ref 6–23)
CO2: 27 mEq/L (ref 19–32)
Calcium: 9.4 mg/dL (ref 8.4–10.5)
Chloride: 104 mEq/L (ref 96–112)
Creatinine, Ser: 1.04 mg/dL (ref 0.40–1.20)
GFR: 52.85 mL/min — ABNORMAL LOW (ref >60.00)
Glucose, Bld: 93 mg/dL (ref 70–99)
Potassium: 3.7 mEq/L (ref 3.5–5.1)
Sodium: 141 mEq/L (ref 135–145)

## 2019-08-13 LAB — LIPID PANEL
Cholesterol: 184 mg/dL (ref 0–200)
HDL: 50.4 mg/dL (ref >39.00)
LDL Cholesterol: 101 mg/dL — ABNORMAL HIGH (ref 0–99)
NonHDL: 133.98
Total CHOL/HDL Ratio: 4
Triglycerides: 164 mg/dL — ABNORMAL HIGH (ref 0.0–149.0)
VLDL: 32.8 mg/dL (ref 0.0–40.0)

## 2019-08-13 LAB — TSH: TSH: 1.7 u[IU]/mL (ref 0.35–4.50)

## 2019-08-23 ENCOUNTER — Encounter: Payer: Self-pay | Admitting: Internal Medicine

## 2019-09-11 ENCOUNTER — Other Ambulatory Visit: Payer: Self-pay | Admitting: Internal Medicine

## 2019-09-11 DIAGNOSIS — Z1231 Encounter for screening mammogram for malignant neoplasm of breast: Secondary | ICD-10-CM

## 2019-09-17 ENCOUNTER — Other Ambulatory Visit: Payer: Self-pay | Admitting: Internal Medicine

## 2019-10-29 ENCOUNTER — Ambulatory Visit
Admission: RE | Admit: 2019-10-29 | Discharge: 2019-10-29 | Disposition: A | Discharge status: Home / Self Care | Payer: Medicare HMO | Source: Ambulatory Visit | Attending: Internal Medicine | Admitting: Internal Medicine

## 2019-10-29 ENCOUNTER — Encounter: Payer: Self-pay | Admitting: Internal Medicine

## 2019-10-29 DIAGNOSIS — E2839 Other primary ovarian failure: Secondary | ICD-10-CM

## 2019-10-29 DIAGNOSIS — Z1231 Encounter for screening mammogram for malignant neoplasm of breast: Secondary | ICD-10-CM | POA: Insufficient documentation

## 2019-10-29 DIAGNOSIS — M85852 Other specified disorders of bone density and structure, left thigh: Secondary | ICD-10-CM | POA: Diagnosis not present

## 2019-11-26 ENCOUNTER — Other Ambulatory Visit: Payer: Self-pay | Admitting: Internal Medicine

## 2019-11-26 DIAGNOSIS — R030 Elevated blood-pressure reading, without diagnosis of hypertension: Secondary | ICD-10-CM

## 2019-12-03 ENCOUNTER — Telehealth: Payer: Self-pay

## 2019-12-03 ENCOUNTER — Encounter: Payer: Self-pay | Admitting: Internal Medicine

## 2019-12-03 DIAGNOSIS — R300 Dysuria: Secondary | ICD-10-CM

## 2019-12-03 NOTE — Telephone Encounter (Signed)
Please confirm nothing more acute going on.  If any acute issues, evaluation today.  If lab ok, I am ok with urine tomorrow, but will need a virtual visit with me tomorrow.

## 2019-12-03 NOTE — Telephone Encounter (Signed)
Spoke with patient. She is having burning with urination, pressure in her bladder, some low back pain. No other acute issues. I have told her to come in and give urine at 8 am and scheduled her virtual at 1:30

## 2019-12-03 NOTE — Telephone Encounter (Signed)
Future lab orders placed

## 2019-12-04 ENCOUNTER — Other Ambulatory Visit: Payer: Self-pay

## 2019-12-04 ENCOUNTER — Ambulatory Visit (INDEPENDENT_AMBULATORY_CARE_PROVIDER_SITE_OTHER): Payer: Medicare HMO | Admitting: Internal Medicine

## 2019-12-04 DIAGNOSIS — R300 Dysuria: Secondary | ICD-10-CM

## 2019-12-04 DIAGNOSIS — R109 Unspecified abdominal pain: Secondary | ICD-10-CM | POA: Diagnosis not present

## 2019-12-04 LAB — POCT URINALYSIS DIPSTICK
Bilirubin, UA: NEGATIVE
Glucose, UA: NEGATIVE
Ketones, UA: NEGATIVE
Leukocytes, UA: NEGATIVE
Nitrite, UA: NEGATIVE
Protein, UA: NEGATIVE
Spec Grav, UA: <=1.005 — AB (ref 1.010–1.025)
Urobilinogen, UA: 0.2 E.U./dL
pH, UA: 5.5 (ref 5.0–8.0)

## 2019-12-04 LAB — URINALYSIS, MICROSCOPIC ONLY

## 2019-12-04 MED ORDER — NITROFURANTOIN MONOHYD MACRO 100 MG PO CAPS: 100.0000 mg | ORAL_CAPSULE | Freq: Two times a day (BID) | ORAL | 0 refills | Status: DC

## 2019-12-04 MED ORDER — PHENAZOPYRIDINE HCL 200 MG PO TABS: 200.0000 mg | ORAL_TABLET | Freq: Three times a day (TID) | ORAL | 0 refills | Status: DC | PRN

## 2019-12-04 NOTE — Progress Notes (Signed)
Patient ID: Jill Torres, female   DOB: 04/13/1951, 68 y.o.   MRN: [id number]   Virtual Visit via telephone Note  This visit type was conducted due to national recommendations for restrictions regarding the COVID-19 pandemic (e.g. social distancing).  This format is felt to be most appropriate for this patient at this time.  All issues noted in this document were discussed and addressed.  No physical exam was performed (except for noted visual exam findings with Video Visits).   I connected with Topanga Ogando by telephone and verified that I am speaking with the correct person using two identifiers. Location patient: home Location provider: work  Persons participating in the telephone visit: patient, provider  I discussed the limitations, risks, security and privacy concerns of performing an evaluation and management service by telephone and the availability of in person appointments.  The patient expressed understanding and agreed to proceed.   Reason for visit: work in appt  HPI: States that starting three days ago, she woke with a stabbing pain left lower abdomen.  Has been using a heating pad and taking tylenol.  Feels full.  Some stomach pain now.  Her back hurts some.  Some pressure and burning.  Some diarrhea, but has IBS and states this is not significantly different.  Has noticed some increased urinary urgency and frequency.  Some incontinence.  No fever.  Eating.  Good appetite.  No nausea or vomiting.  Has been trying to stay hydrated with pedialyte and water. Taking cystex.  Was questioning if had UTI.    ROS: See pertinent positives and negatives per HPI.  Past Medical History:  Diagnosis Date  . Allergy   . Arthritis   . Depression   . Diverticulosis of colon (without mention of hemorrhage)   . GERD (gastroesophageal reflux disease)   . History of chicken pox   . History of shingles may 2013  . HLD (hyperlipidemia)   . HTN (hypertension)   . Irritable bowel  syndrome   . Unspecified hypothyroidism     Past Surgical History:  Procedure Laterality Date  . ABDOMINAL HYSTERECTOMY  1983   due to heavy bleeding & ovary cyst  . APPENDECTOMY  1974  . BLADDER DIVERTICULECTOMY  1990  . CESAREAN SECTION     x 2  . CHOLECYSTECTOMY    . DILATION AND CURETTAGE OF UTERUS     x 2  . DIRECT LARYNGOSCOPY Right 06/11/2015   Procedure: suspension microdirect laryngoscopy with biopsy of right tongue base;  Surgeon: Carloyn Manner, MD;  Location: [facility] ORS;  Service: ENT;  Laterality: Right;  . NASAL SEPTUM SURGERY  1999  . PARTIAL HYSTERECTOMY  1983   cyst on ovary and heavy bleeding     Family History  Problem Relation Age of Onset  . Arthritis Mother   . Hypertension Mother   . Hyperlipidemia Mother   . Atrial fibrillation Mother   . Heart disease Mother   . Diabetes Mother   . Colon polyps Mother   . Hyperlipidemia Father   . Heart disease Father        s/p CABG  . Stroke Father   . Rheumatic fever Sister        s/p valve replacement  . Deep vein thrombosis Brother        after immobilization   . Colon polyps Brother   . Heart disease Brother   . Diabetes Maternal Grandfather   . Heart disease Brother   . Diabetes Maternal Aunt   .  Diabetes Maternal Uncle   . Breast cancer Neg Hx   . Colon cancer Neg Hx   . Esophageal cancer Neg Hx   . Rectal cancer Neg Hx   . Stomach cancer Neg Hx     SOCIAL HX: reviewed.    Current Outpatient Medications:  .  AMBULATORY NON FORMULARY MEDICATION, Allergy shots once a week, Disp: , Rfl:  .  amLODipine (NORVASC) 2.5 MG tablet, TAKE 1 TABLET BY MOUTH EVERY DAY, Disp: 90 tablet, Rfl: 1 .  azelastine (ASTELIN) 0.1 % nasal spray, PLACE 1 SPRAY INTO BOTH NOSTRILS 2 (TWO) TIMES DAILY. USE IN EACH NOSTRIL AS DIRECTED, Disp: 30 mL, Rfl: 3 .  bifidobacterium infantis (ALIGN) capsule, Take 1 capsule by mouth daily.  , Disp: , Rfl:  .  calcium-vitamin D (OSCAL WITH D 500-200) 500-200 MG-UNIT per tablet,  Take 1 tablet by mouth daily.  , Disp: , Rfl:  .  Coenzyme Q10 (COQ10 PO), Take by mouth daily., Disp: , Rfl:  .  fish oil-omega-3 fatty acids 1000 MG capsule, Take 2 g by mouth daily., Disp: , Rfl:  .  Fluticasone Furoate (FLONASE SENSIMIST NA), Place into the nose., Disp: , Rfl:  .  ibuprofen (ADVIL,MOTRIN) 200 MG tablet, Take 200 mg by mouth as directed.  , Disp: , Rfl:  .  multivitamin (THERAGRAN) per tablet, Take 1 tablet by mouth daily.  , Disp: , Rfl:  .  neomycin-polymyxin-hydrocortisone (CORTISPORIN) OTIC solution, Place 3 drops into the left ear 3 (three) times daily as needed., Disp: 10 mL, Rfl: 0 .  nitrofurantoin, macrocrystal-monohydrate, (MACROBID) 100 MG capsule, Take 1 capsule (100 mg total) by mouth 2 (two) times daily., Disp: 10 capsule, Rfl: 0 .  phenazopyridine (PYRIDIUM) 200 MG tablet, Take 1 tablet (200 mg total) by mouth 3 (three) times daily as needed for pain., Disp: 9 tablet, Rfl: 0 .  polyethylene glycol (MIRALAX) powder, Take 17 g by mouth as needed. , Disp: , Rfl:  .  pravastatin (PRAVACHOL) 40 MG tablet, TAKE 1 TABLET BY MOUTH EVERY DAY, Disp: 90 tablet, Rfl: 1  EXAM:  GENERAL: alert.  Sounds to be in no acute distress.  Answering questions appropriately.    PSYCH/NEURO: pleasant and cooperative, no obvious depression or anxiety, speech and thought processing grossly intact  ASSESSMENT AND PLAN:  Discussed the following assessment and plan:  Abdominal pain Some lower abdominal pressure and urinary urgency and frequency as outlined.  Urine dip with blood.  Given symptoms and blood present, will start on abx - macrobid.  Has taken previously and tolerated.  Send urine for culture.      I discussed the assessment and treatment plan with the patient. The patient was provided an opportunity to ask questions and all were answered. The patient agreed with the plan and demonstrated an understanding of the instructions.   The patient was advised to call back or seek  an in-person evaluation if the symptoms worsen or if the condition fails to improve as anticipated.  I provided 15 minutes of non-face-to-face time during this encounter.   [name], MD

## 2019-12-04 NOTE — Progress Notes (Signed)
nrg

## 2019-12-05 LAB — URINE CULTURE
MICRO NUMBER:: 1226621
Result:: NO GROWTH
SPECIMEN QUALITY:: ADEQUATE

## 2019-12-08 ENCOUNTER — Encounter: Payer: Self-pay | Admitting: Internal Medicine

## 2019-12-08 NOTE — Assessment & Plan Note (Signed)
Some lower abdominal pressure and urinary urgency and frequency as outlined.  Urine dip with blood.  Given symptoms and blood present, will start on abx - macrobid.  Has taken previously and tolerated.  Send urine for culture.

## 2020-01-08 ENCOUNTER — Other Ambulatory Visit: Payer: Self-pay

## 2020-01-08 ENCOUNTER — Ambulatory Visit (INDEPENDENT_AMBULATORY_CARE_PROVIDER_SITE_OTHER): Payer: Medicare HMO | Admitting: Internal Medicine

## 2020-01-08 VITALS — BP 136/74 | HR 80 | Temp 96.8°F | Resp 16 | Ht 63.0 in | Wt 170.6 lb

## 2020-01-08 DIAGNOSIS — R69 Illness, unspecified: Secondary | ICD-10-CM | POA: Diagnosis not present

## 2020-01-08 DIAGNOSIS — Z87448 Personal history of other diseases of urinary system: Secondary | ICD-10-CM | POA: Insufficient documentation

## 2020-01-08 DIAGNOSIS — E7800 Pure hypercholesterolemia, unspecified: Secondary | ICD-10-CM | POA: Diagnosis not present

## 2020-01-08 DIAGNOSIS — Z0000 Encounter for general adult medical examination without abnormal findings: Secondary | ICD-10-CM

## 2020-01-08 DIAGNOSIS — Z1159 Encounter for screening for other viral diseases: Secondary | ICD-10-CM

## 2020-01-08 DIAGNOSIS — Z9109 Other allergy status, other than to drugs and biological substances: Secondary | ICD-10-CM

## 2020-01-08 DIAGNOSIS — I10 Essential (primary) hypertension: Secondary | ICD-10-CM

## 2020-01-08 DIAGNOSIS — E039 Hypothyroidism, unspecified: Secondary | ICD-10-CM | POA: Diagnosis not present

## 2020-01-08 DIAGNOSIS — M542 Cervicalgia: Secondary | ICD-10-CM

## 2020-01-08 DIAGNOSIS — F419 Anxiety disorder, unspecified: Secondary | ICD-10-CM

## 2020-01-08 LAB — URINALYSIS, ROUTINE W REFLEX MICROSCOPIC
Bilirubin Urine: NEGATIVE
Ketones, ur: NEGATIVE
Leukocytes,Ua: NEGATIVE
Nitrite: NEGATIVE
Specific Gravity, Urine: 1.01 (ref 1.000–1.030)
Total Protein, Urine: NEGATIVE
Urine Glucose: NEGATIVE
Urobilinogen, UA: 0.2 (ref 0.0–1.0)
pH: 5.5 (ref 5.0–8.0)

## 2020-01-08 LAB — HEPATIC FUNCTION PANEL
ALT: 12 U/L (ref 0–35)
AST: 13 U/L (ref 0–37)
Albumin: 4.4 g/dL (ref 3.5–5.2)
Alkaline Phosphatase: 57 U/L (ref 39–117)
Bilirubin, Direct: 0.1 mg/dL (ref 0.0–0.3)
Total Bilirubin: 0.8 mg/dL (ref 0.2–1.2)
Total Protein: 7.5 g/dL (ref 6.0–8.3)

## 2020-01-08 LAB — BASIC METABOLIC PANEL
BUN: 12 mg/dL (ref 6–23)
CO2: 27 mEq/L (ref 19–32)
Calcium: 9.7 mg/dL (ref 8.4–10.5)
Chloride: 103 mEq/L (ref 96–112)
Creatinine, Ser: 1 mg/dL (ref 0.40–1.20)
GFR: 55.22 mL/min — ABNORMAL LOW (ref >60.00)
Glucose, Bld: 80 mg/dL (ref 70–99)
Potassium: 3.9 mEq/L (ref 3.5–5.1)
Sodium: 138 mEq/L (ref 135–145)

## 2020-01-08 LAB — LIPID PANEL
Cholesterol: 201 mg/dL — ABNORMAL HIGH (ref 0–200)
HDL: 57 mg/dL (ref >39.00)
LDL Cholesterol: 112 mg/dL — ABNORMAL HIGH (ref 0–99)
NonHDL: 144.08
Total CHOL/HDL Ratio: 4
Triglycerides: 158 mg/dL — ABNORMAL HIGH (ref 0.0–149.0)
VLDL: 31.6 mg/dL (ref 0.0–40.0)

## 2020-01-08 NOTE — Assessment & Plan Note (Signed)
Physical today 01/08/20.  Mammogram 10/29/19 - Birads I.  colonosocopy 02/22/19 - two polyps (cecum).

## 2020-01-08 NOTE — Progress Notes (Addendum)
Patient ID: Jill Torres, female   DOB: 22-Jul-1951, 69 y.o.   MRN: [id number]   Subjective:    Patient ID: Jill Torres, female    DOB: 11-23-1951, 69 y.o.   MRN: [id number]  HPI  This visit occurred during the SARS-CoV-2 public health emergency.  Safety protocols were in place, including screening questions prior to the visit, additional usage of staff PPE, and extensive cleaning of exam room while observing appropriate contact time as indicated for disinfecting solutions.  Patient here for her physical exam.  She reports she is doing well.  Feels good.  Tries to stay active.  No chest pain or sob reported.  No abdominal pain.  Previous pain resolved.  Bowels moving.  No chest congestion or cough.  No fever.  Some discomfort - right posterior upper shoulder.  Increased when looking to right/left.  Desires no further intervention.  Stretches.  On prozac.  Handling stress.    Past Medical History:  Diagnosis Date  . Allergy   . Arthritis   . Depression   . Diverticulosis of colon (without mention of hemorrhage)   . GERD (gastroesophageal reflux disease)   . History of chicken pox   . History of shingles may 2013  . HLD (hyperlipidemia)   . HTN (hypertension)   . Irritable bowel syndrome   . Unspecified hypothyroidism    Past Surgical History:  Procedure Laterality Date  . ABDOMINAL HYSTERECTOMY  1983   due to heavy bleeding & ovary cyst  . APPENDECTOMY  1974  . BLADDER DIVERTICULECTOMY  1990  . CESAREAN SECTION     x 2  . CHOLECYSTECTOMY    . DILATION AND CURETTAGE OF UTERUS     x 2  . DIRECT LARYNGOSCOPY Right 06/11/2015   Procedure: suspension microdirect laryngoscopy with biopsy of right tongue base;  Surgeon: Carloyn Manner, MD;  Location: [facility] ORS;  Service: ENT;  Laterality: Right;  . NASAL SEPTUM SURGERY  1999  . PARTIAL HYSTERECTOMY  1983   cyst on ovary and heavy bleeding    Family History  Problem Relation Age of Onset  . Arthritis Mother   . Hypertension  Mother   . Hyperlipidemia Mother   . Atrial fibrillation Mother   . Heart disease Mother   . Diabetes Mother   . Colon polyps Mother   . Hyperlipidemia Father   . Heart disease Father        s/p CABG  . Stroke Father   . Rheumatic fever Sister        s/p valve replacement  . Deep vein thrombosis Brother        after immobilization   . Colon polyps Brother   . Heart disease Brother   . Diabetes Maternal Grandfather   . Heart disease Brother   . Diabetes Maternal Aunt   . Diabetes Maternal Uncle   . Breast cancer Neg Hx   . Colon cancer Neg Hx   . Esophageal cancer Neg Hx   . Rectal cancer Neg Hx   . Stomach cancer Neg Hx    Social History   Socioeconomic History  . Marital status: Single    Spouse name: Not on file  . Number of children: 2  . Years of education: Not on file  . Highest education level: Not on file  Occupational History  . Occupation: retired    Comment: Chief of Staff   Tobacco Use  . Smoking status: Never Smoker  . Smokeless tobacco: Never Used  Substance and Sexual Activity  . Alcohol use: No    Alcohol/week: 0.0 standard drinks  . Drug use: No  . Sexual activity: Not on file  Other Topics Concern  . Not on file  Social History Narrative   No regular exercise   Lives in noisy environment- very stressful    Daily caffeine use    Social Determinants of Health   Financial Resource Strain:   . Difficulty of Paying Living Expenses: Not on file  Food Insecurity:   . Worried About Charity fundraiser in the Last Year: Not on file  . Ran Out of Food in the Last Year: Not on file  Transportation Needs:   . Lack of Transportation (Medical): Not on file  . Lack of Transportation (Non-Medical): Not on file  Physical Activity:   . Days of Exercise per Week: Not on file  . Minutes of Exercise per Session: Not on file  Stress:   . Feeling of Stress : Not on file  Social Connections:   . Frequency of Communication with Friends and Family:  Not on file  . Frequency of Social Gatherings with Friends and Family: Not on file  . Attends Religious Services: Not on file  . Active Member of Clubs or Organizations: Not on file  . Attends Archivist Meetings: Not on file  . Marital Status: Not on file    Outpatient Encounter Medications as of 01/08/2020  Medication Sig  . AMBULATORY NON FORMULARY MEDICATION Allergy shots once a week  . amLODipine (NORVASC) 2.5 MG tablet TAKE 1 TABLET BY MOUTH EVERY DAY  . azelastine (ASTELIN) 0.1 % nasal spray PLACE 1 SPRAY INTO BOTH NOSTRILS 2 (TWO) TIMES DAILY. USE IN EACH NOSTRIL AS DIRECTED  . bifidobacterium infantis (ALIGN) capsule Take 1 capsule by mouth daily.    . calcium-vitamin D (OSCAL WITH D 500-200) 500-200 MG-UNIT per tablet Take 1 tablet by mouth daily.    . Coenzyme Q10 (COQ10 PO) Take by mouth daily.  . fish oil-omega-3 fatty acids 1000 MG capsule Take 2 g by mouth daily.  . Fluticasone Furoate (FLONASE SENSIMIST NA) Place into the nose.  . ibuprofen (ADVIL,MOTRIN) 200 MG tablet Take 200 mg by mouth as directed.    . multivitamin (THERAGRAN) per tablet Take 1 tablet by mouth daily.    Marland Kitchen neomycin-polymyxin-hydrocortisone (CORTISPORIN) OTIC solution Place 3 drops into the left ear 3 (three) times daily as needed.  . polyethylene glycol (MIRALAX) powder Take 17 g by mouth as needed.   . [DISCONTINUED] nitrofurantoin, macrocrystal-monohydrate, (MACROBID) 100 MG capsule Take 1 capsule (100 mg total) by mouth 2 (two) times daily.  . [DISCONTINUED] phenazopyridine (PYRIDIUM) 200 MG tablet Take 1 tablet (200 mg total) by mouth 3 (three) times daily as needed for pain.  . [DISCONTINUED] pravastatin (PRAVACHOL) 40 MG tablet TAKE 1 TABLET BY MOUTH EVERY DAY   No facility-administered encounter medications on file as of 01/08/2020.    Review of Systems  Constitutional: Negative for appetite change and unexpected weight change.  HENT: Negative for congestion and sinus pressure.     Eyes: Negative for pain and visual disturbance.  Respiratory: Negative for cough, chest tightness and shortness of breath.   Cardiovascular: Negative for chest pain, palpitations and leg swelling.  Gastrointestinal: Negative for abdominal pain, diarrhea, nausea and vomiting.  Genitourinary: Negative for difficulty urinating and dysuria.  Musculoskeletal: Negative for joint swelling and myalgias.  Skin: Negative for color change and rash.  Neurological: Negative for dizziness,  light-headedness and headaches.  Hematological: Negative for adenopathy. Does not bruise/bleed easily.  Psychiatric/Behavioral: Negative for agitation and dysphoric mood.       Objective:    Physical Exam Constitutional:      General: She is not in acute distress.    Appearance: Normal appearance. She is well-developed.  HENT:     Head: Normocephalic and atraumatic.     Right Ear: External ear normal.     Left Ear: External ear normal.  Eyes:     General: No scleral icterus.       Right eye: No discharge.        Left eye: No discharge.     Conjunctiva/sclera: Conjunctivae normal.  Neck:     Thyroid: No thyromegaly.  Cardiovascular:     Rate and Rhythm: Normal rate and regular rhythm.  Pulmonary:     Effort: No tachypnea, accessory muscle usage or respiratory distress.     Breath sounds: Normal breath sounds. No decreased breath sounds or wheezing.  Chest:     Breasts:        Right: No inverted nipple, mass, nipple discharge or tenderness (no axillary adenopathy).        Left: No inverted nipple, mass, nipple discharge or tenderness (no axilarry adenopathy).  Abdominal:     General: Bowel sounds are normal.     Palpations: Abdomen is soft.     Tenderness: There is no abdominal tenderness.  Musculoskeletal:        General: No swelling or tenderness.     Cervical back: Neck supple. No tenderness.  Lymphadenopathy:     Cervical: No cervical adenopathy.  Skin:    Findings: No erythema or rash.   Neurological:     Mental Status: She is alert and oriented to person, place, and time.  Psychiatric:        Mood and Affect: Mood normal.        Behavior: Behavior normal.     BP 136/74   Pulse 80   Temp (!) 96.8 F (36 C)   Resp 16   Ht 5\' 3"  (1.6 m)   Wt 170 lb 9.6 oz (77.4 kg)   LMP 12/12/1981   SpO2 99%   BMI 30.22 kg/m  Wt Readings from Last 3 Encounters:  01/08/20 170 lb 9.6 oz (77.4 kg)  12/04/19 171 lb (77.6 kg)  07/26/19 177 lb (80.3 kg)     Lab Results  Component Value Date   WBC 8.6 08/13/2019   HGB 13.3 08/13/2019   HCT 39.2 08/13/2019   PLT 329.0 08/13/2019   GLUCOSE 80 01/08/2020   CHOL 201 (H) 01/08/2020   TRIG 158.0 (H) 01/08/2020   HDL 57.00 01/08/2020   LDLDIRECT 155.8 06/27/2013   LDLCALC 112 (H) 01/08/2020   ALT 12 01/08/2020   AST 13 01/08/2020   NA 138 01/08/2020   K 3.9 01/08/2020   CL 103 01/08/2020   CREATININE 1.00 01/08/2020   BUN 12 01/08/2020   CO2 27 01/08/2020   TSH 1.70 08/13/2019    DG Bone Density  Result Date: 10/29/2019 EXAM: DUAL X-RAY ABSORPTIOMETRY (DXA) FOR BONE MINERAL DENSITY IMPRESSION: Dear Dr Nicki Reaper, Your patient Shanekia Nealey Solanki completed a FRAX assessment on 10/29/2019 using the [place] (analysis version: 14.10) manufactured by [employer]. The following summarizes the results of our evaluation. PATIENT BIOGRAPHICAL: Name: Tomomi, Marseille Patient ID: [id number] Birth Date: April 03, 1951 Height:    63.0 in. Gender:     [age]  [age]       Weight:    172.3 lbs. Ethnicity:  White                            Exam Date: 10/29/2019 FRAX* RESULTS:  (version: 3.5) 10-year Probability of Fracture1 Major Osteoporotic Fracture2 Hip Fracture 18.1% 2.3% Population: [place] (Caucasian) Risk Factors: Family Hist. (Parent hip fracture) Based on Femur (Left) Neck BMD 1 -The 10-year probability of fracture may be lower than reported if the patient has received treatment. 2 -Major Osteoporotic Fracture: Clinical  Spine, Forearm, Hip or Shoulder *FRAX is a Materials engineer of the [employer] for Metabolic Bone Disease, a [place] (WHO) [facility]. ASSESSMENT: The probability of a major osteoporotic fracture is 18.1% within the next ten years. The probability of a hip fracture is 2.3% within the next ten years. . Technologist: [employer]CE PATIENT BIOGRAPHICAL: Name: Jaedynn, Labossiere Patient ID: [id number] Birth Date: [birth year] Height: 63.0 in. Gender: Female Exam Date: 10/29/2019 Weight: 172.3 lbs. Indications: Caucasian, Height Loss, Hysterectomy, Parent Hip Fracture, Postmenopausal, Family Hist. (Parent hip fracture) Fractures: Treatments: calcium w/ vit D, Multi-Vitamin ASSESSMENT: The BMD measured at Femur Neck Left is 0.776 g/cm2 with a T-score of -1.9. This patient is considered osteopenic according to [facility]) criteria. The scan quality is good. Site Region Measured Measured WHO Young Adult BMD Date       Age      Classification T-score AP Spine L1-L4 10/29/2019 67.2 Normal -0.2 1.175 g/cm2 DualFemur Neck Left 10/29/2019 67.2 Osteopenia -1.9 0.776 g/cm2 World Health Organization [facility]) criteria for post-menopausal, Caucasian Women: Normal:       T-score at or above -1 SD Osteopenia:   T-score between -1 and -2.5 SD Osteoporosis: T-score at or below -2.5 SD RECOMMENDATIONS: 1. All patients should optimize calcium and vitamin D intake. 2. Consider FDA-approved medical therapies in postmenopausal women and men aged 31 years and older, based on the following: a. A hip or vertebral(clinical or morphometric) fracture b. T-score < -2.5 at the femoral neck or spine after appropriate evaluation to exclude secondary causes c. Low bone mass (T-score between -1.0 and -2.5 at the femoral neck or spine) and a 10-year probability of a hip fracture > 3% or a 10-year probability of a major osteoporosis-related fracture > 20% based on the [place]-adapted WHO algorithm d.  Clinician judgment and/or patient preferences may indicate treatment for people with 10-year fracture probabilities above or below these levels FOLLOW-UP: People with diagnosed cases of osteoporosis or at high risk for fracture should have regular bone mineral density tests. For patients eligible for Medicare, routine testing is allowed once every 2 years. The testing frequency can be increased to one year for patients who have rapidly progressing disease, those who are receiving or discontinuing medical therapy to restore bone mass, or have additional risk factors. I have reviewed this report, and agree with the above findings. [facility] Electronically Signed   By: Lowella Grip III M.D.   On: 10/29/2019 09:58   MM 3D SCREEN BREAST BILATERAL  Result Date: 10/29/2019 CLINICAL DATA:  Screening. EXAM: DIGITAL SCREENING BILATERAL MAMMOGRAM WITH TOMO AND CAD COMPARISON:  Previous exam(s). ACR Breast Density Category b: There are scattered areas of fibroglandular density. FINDINGS: There are no findings suspicious for malignancy. Images were processed with CAD. IMPRESSION: No mammographic evidence of malignancy. A result letter of this  screening mammogram will be mailed directly to the patient. RECOMMENDATION: Screening mammogram in one year. (Code:SM-B-01Y) BI-RADS CATEGORY  1: Negative. Electronically Signed   By: Curlene Dolphin M.D.   On: 10/29/2019 16:10       Assessment & Plan:   Problem List Items Addressed This Visit    Anxiety    Doing well on prozac.        Environmental allergies    Controlled.       Health care maintenance    Physical today 01/08/20.  Mammogram 10/29/19 - Birads I.  colonosocopy 02/22/19 - two polyps (cecum).       History of hematuria    Recheck urine to confirm no red blood cells.       Relevant Orders   Urinalysis, Routine w reflex microscopic (Completed)   HTN (hypertension) - Primary    Blood pressure as outlined.  Continue to spot check.  Continue current medication regimen.  Follow pressures.  Follow metabolic panel.       Relevant Orders   Basic metabolic panel (Completed)   Hypercholesterolemia    On pravastatin.  Low cholesterol diet and exercise.  Follow lipid panel and liver function tests.        Relevant Orders   Hepatic function panel (Completed)   Lipid panel (Completed)   Hypothyroidism    Not on any medication.  Check tsh.       Neck pain    Neck/posterior shoulder discomfort.  Notices with looking from left to right.  No significant pain.  Tylenol if needed. Stretches. Follow.  Notify me if persistent.        Other Visit Diagnoses    Need for hepatitis C screening test       Relevant Orders   Hepatitis C antibody (Completed)       [name], MD

## 2020-01-09 LAB — HEPATITIS C ANTIBODY
Hepatitis C Ab: NONREACTIVE
SIGNAL TO CUT-OFF: 0.01 (ref <1.00)

## 2020-01-10 ENCOUNTER — Encounter: Payer: Self-pay | Admitting: Internal Medicine

## 2020-01-11 ENCOUNTER — Other Ambulatory Visit: Payer: Self-pay | Admitting: Internal Medicine

## 2020-01-11 DIAGNOSIS — E7800 Pure hypercholesterolemia, unspecified: Secondary | ICD-10-CM

## 2020-01-11 MED ORDER — ROSUVASTATIN CALCIUM 20 MG PO TABS: 20.0000 mg | ORAL_TABLET | Freq: Every day | ORAL | 2 refills | Status: DC

## 2020-01-11 NOTE — Progress Notes (Signed)
Order placed for f/u liver panel and rx sent in for crestor.

## 2020-01-12 ENCOUNTER — Encounter: Payer: Self-pay | Admitting: Internal Medicine

## 2020-01-12 DIAGNOSIS — M542 Cervicalgia: Secondary | ICD-10-CM | POA: Insufficient documentation

## 2020-01-12 NOTE — Assessment & Plan Note (Signed)
On pravastatin.  Low cholesterol diet and exercise.  Follow lipid panel and liver function tests.   

## 2020-01-12 NOTE — Assessment & Plan Note (Signed)
Neck/posterior shoulder discomfort.  Notices with looking from left to right.  No significant pain.  Tylenol if needed. Stretches. Follow.  Notify me if persistent.

## 2020-01-12 NOTE — Assessment & Plan Note (Signed)
Not on any medication.  Check tsh.

## 2020-01-12 NOTE — Assessment & Plan Note (Signed)
Blood pressure as outlined.  Continue to spot check. Continue current medication regimen.  Follow pressures.  Follow metabolic panel.

## 2020-01-12 NOTE — Assessment & Plan Note (Signed)
Doing well on prozac.

## 2020-01-12 NOTE — Assessment & Plan Note (Signed)
Controlled.  

## 2020-01-12 NOTE — Assessment & Plan Note (Signed)
Recheck urine to confirm no red blood cells.

## 2020-01-29 DIAGNOSIS — R69 Illness, unspecified: Secondary | ICD-10-CM | POA: Diagnosis not present

## 2020-02-06 DIAGNOSIS — Z0100 Encounter for examination of eyes and vision without abnormal findings: Secondary | ICD-10-CM | POA: Diagnosis not present

## 2020-02-06 DIAGNOSIS — H5203 Hypermetropia, bilateral: Secondary | ICD-10-CM | POA: Diagnosis not present

## 2020-02-07 ENCOUNTER — Ambulatory Visit (INDEPENDENT_AMBULATORY_CARE_PROVIDER_SITE_OTHER): Payer: Medicare HMO

## 2020-02-07 ENCOUNTER — Other Ambulatory Visit: Payer: Self-pay

## 2020-02-07 VITALS — Ht 63.0 in | Wt 170.0 lb

## 2020-02-07 DIAGNOSIS — Z0000 Encounter for general adult medical examination without abnormal findings: Secondary | ICD-10-CM | POA: Diagnosis not present

## 2020-02-07 NOTE — Progress Notes (Addendum)
Subjective:   Jill Torres is a 69 y.o. female who presents for an Initial Medicare Annual Wellness Visit.  Review of Systems    No ROS.  Medicare Wellness Virtual Visit.  Visual/audio telehealth visit, UTA vital signs.   Ht/Wt provided. See social history for additional risk factors.    Cardiac Risk Factors include: advanced age (>54men, >31 women)     Objective:    Today's Vitals   02/07/20 0833  Weight: 170 lb (77.1 kg)  Height: 5\' 3"  (1.6 m)   Body mass index is 30.11 kg/m.  Advanced Directives 02/07/2020 06/11/2015 06/04/2015  Does Patient Have a Medical Advance Directive? No No No  Would patient like information on creating a medical advance directive? No - Patient declined No - patient declined information -    Current Medications (verified) Outpatient Encounter Medications as of 02/07/2020  Medication Sig  . AMBULATORY NON FORMULARY MEDICATION Allergy shots once a week  . amLODipine (NORVASC) 2.5 MG tablet TAKE 1 TABLET BY MOUTH EVERY DAY  . azelastine (ASTELIN) 0.1 % nasal spray PLACE 1 SPRAY INTO BOTH NOSTRILS 2 (TWO) TIMES DAILY. USE IN EACH NOSTRIL AS DIRECTED  . bifidobacterium infantis (ALIGN) capsule Take 1 capsule by mouth daily.    . calcium-vitamin D (OSCAL WITH D 500-200) 500-200 MG-UNIT per tablet Take 1 tablet by mouth daily.    . Coenzyme Q10 (COQ10 PO) Take by mouth daily.  . fish oil-omega-3 fatty acids 1000 MG capsule Take 2 g by mouth daily.  . Fluticasone Furoate (FLONASE SENSIMIST NA) Place into the nose.  . ibuprofen (ADVIL,MOTRIN) 200 MG tablet Take 200 mg by mouth as directed.    . multivitamin (THERAGRAN) per tablet Take 1 tablet by mouth daily.    Marland Kitchen neomycin-polymyxin-hydrocortisone (CORTISPORIN) OTIC solution Place 3 drops into the left ear 3 (three) times daily as needed.  . polyethylene glycol (MIRALAX) powder Take 17 g by mouth as needed.   . rosuvastatin (CRESTOR) 20 MG tablet Take 1 tablet (20 mg total) by mouth daily.   No  facility-administered encounter medications on file as of 02/07/2020.    Allergies (verified) Patient has no known allergies.   History: Past Medical History:  Diagnosis Date  . Allergy   . Arthritis   . Depression   . Diverticulosis of colon (without mention of hemorrhage)   . GERD (gastroesophageal reflux disease)   . History of chicken pox   . History of shingles may 2013  . HLD (hyperlipidemia)   . HTN (hypertension)   . Irritable bowel syndrome   . Unspecified hypothyroidism    Past Surgical History:  Procedure Laterality Date  . ABDOMINAL HYSTERECTOMY  1983   due to heavy bleeding & ovary cyst  . APPENDECTOMY  1974  . BLADDER DIVERTICULECTOMY  1990  . CESAREAN SECTION     x 2  . CHOLECYSTECTOMY    . DILATION AND CURETTAGE OF UTERUS     x 2  . DIRECT LARYNGOSCOPY Right 06/11/2015   Procedure: suspension microdirect laryngoscopy with biopsy of right tongue base;  Surgeon: Carloyn Manner, MD;  Location: [facility] ORS;  Service: ENT;  Laterality: Right;  . NASAL SEPTUM SURGERY  1999  . PARTIAL HYSTERECTOMY  1983   cyst on ovary and heavy bleeding    Family History  Problem Relation Age of Onset  . Arthritis Mother   . Hypertension Mother   . Hyperlipidemia Mother   . Atrial fibrillation Mother   . Heart disease Mother   .  Diabetes Mother   . Colon polyps Mother   . Hyperlipidemia Father   . Heart disease Father        s/p CABG  . Stroke Father   . Rheumatic fever Sister        s/p valve replacement  . Deep vein thrombosis Brother        after immobilization   . Colon polyps Brother   . Heart disease Brother   . Diabetes Maternal Grandfather   . Heart disease Brother   . Diabetes Maternal Aunt   . Diabetes Maternal Uncle   . Breast cancer Neg Hx   . Colon cancer Neg Hx   . Esophageal cancer Neg Hx   . Rectal cancer Neg Hx   . Stomach cancer Neg Hx    Social History   Socioeconomic History  . Marital status: Single    Spouse name: Not on file  .  Number of children: 2  . Years of education: Not on file  . Highest education level: Not on file  Occupational History  . Occupation: retired    Comment: Chief of Staff   Tobacco Use  . Smoking status: Never Smoker  . Smokeless tobacco: Never Used  Substance and Sexual Activity  . Alcohol use: No    Alcohol/week: 0.0 standard drinks  . Drug use: No  . Sexual activity: Not on file  Other Topics Concern  . Not on file  Social History Narrative   No regular exercise   Lives in noisy environment- very stressful    Daily caffeine use    Social Determinants of Health   Financial Resource Strain: Low Risk   . Difficulty of Paying Living Expenses: Not hard at all  Food Insecurity: No Food Insecurity  . Worried About Charity fundraiser in the Last Year: Never true  . Ran Out of Food in the Last Year: Never true  Transportation Needs: No Transportation Needs  . Lack of Transportation (Medical): No  . Lack of Transportation (Non-Medical): No  Physical Activity: Insufficiently Active  . Days of Exercise per Week: 4 days  . Minutes of Exercise per Session: 20 min  Stress: No Stress Concern Present  . Feeling of Stress : Not at all  Social Connections: Not Isolated  . Frequency of Communication with Friends and Family: More than three times a week  . Frequency of Social Gatherings with Friends and Family: More than three times a week  . Attends Religious Services: 1 to 4 times per year  . Active Member of Clubs or Organizations: Yes  . Attends Archivist Meetings: Not on file  . Marital Status: Married    Tobacco Counseling Counseling given: Not Answered   Clinical Intake:  Pre-visit preparation completed: Yes        Diabetes: No  How often do you need to have someone help you when you read instructions, pamphlets, or other written materials from your doctor or pharmacy?: 1 - Never  Interpreter Needed?: No      Activities of Daily Living In  your present state of health, do you have any difficulty performing the following activities: 02/07/2020  Hearing? N  Vision? N  Difficulty concentrating or making decisions? N  Walking or climbing stairs? N  Dressing or bathing? N  Doing errands, shopping? N  Preparing Food and eating ? N  Using the Toilet? N  In the past six months, have you accidently leaked urine? N  Do you have problems  with loss of bowel control? N  Managing your Medications? N  Managing your Finances? N  Housekeeping or managing your Housekeeping? N  Some recent data might be hidden     Immunizations and Health Maintenance Immunization History  Administered Date(s) Administered  . Influenza Split 08/29/2014  . Influenza, High Dose Seasonal PF 08/16/2018  . Influenza,inj,Quad PF,6+ Mos 08/13/2019  . Influenza-Unspecified 08/27/2015, 08/21/2017  . Pneumococcal Conjugate-13 04/11/2018  . Pneumococcal Polysaccharide-23 07/26/2019  . Tdap 04/18/2011   There are no preventive care reminders to display for this patient.  Patient Care Team: [name], MD as PCP - General (Internal Medicine)  Indicate any recent Medical Services you may have received from other than [facility] providers in the past year (date may be approximate).     Assessment:   This is a routine wellness examination for Taree.  Nurse connected with patient 02/07/20 at  8:30 AM EST by a telephone enabled telemedicine application and verified that I am speaking with the correct person using two identifiers. Patient stated full name and DOB. Patient gave permission to continue with virtual visit. Patient's location was at home and Nurse's location was at [place] office.   Patient is alert and oriented x3. Patient denies difficulty focusing or concentrating. Patient likes to read for brain stimulation.   Health Maintenance Due: -See completed HM at the end of note.   Eye: Visual acuity not assessed. Virtual visit. Followed by their  ophthalmologist.  Dental: Visits every 6 months.    Hearing: Demonstrates normal hearing during visit.  Safety:  Patient feels safe at home- yes Patient does have smoke detectors at home- yes Patient does wear sunscreen or protective clothing when in direct sunlight - yes Patient does wear seat belt when in a moving vehicle - yes Patient drives- yes Adequate lighting in walkways free from debris- yes Grab bars and handrails used as appropriate- yes Ambulates with an assistive device- no Cell phone on person when ambulating outside of the home- yes  Social: Alcohol intake - no   Smoking history- never Smokers in home? none Illicit drug use? none  Medication: Taking as directed and without issues.  Pill box in use -yes  Self managed - yes  Covid-19: Precautions and sickness symptoms discussed. Wears mask, social distancing, hand hygiene as appropriate.   Activities of Daily Living Patient denies needing assistance with: household chores, feeding themselves, getting from bed to chair, getting to the toilet, bathing/showering, dressing, managing money, or preparing meals.   Discussed the importance of a healthy diet, water intake and the benefits of aerobic exercise.   Physical activity- no routine, spends time with grand children.  Diet:  High protein, low cholesterol Water: good intake Caffeine: 1 cup of coffee  Other Providers Patient Care Team: [name], MD as PCP - General (Internal Medicine)  Hearing/Vision screen  Hearing Screening   125Hz  250Hz  500Hz  1000Hz  2000Hz  3000Hz  4000Hz  6000Hz  8000Hz   Right ear:           Left ear:           Comments: Patient is able to hear conversational tones without difficulty.  No issues reported.  Vision Screening Comments: Wears corrective lenses Visual acuity not assessed, virtual visit.  They have seen their ophthalmologist in the last 12 months.     Dietary issues and exercise activities discussed:    Goals     . Increase physical activity     Use stretch bands, peddler and hand weights for exercise.  Depression Screen PHQ 2/9 Scores 02/07/2020 02/07/2020 02/13/2017 10/08/2015  PHQ - 2 Score 0 0 0 0    Fall Risk Fall Risk  02/07/2020 12/04/2019 04/11/2018 02/13/2017 10/08/2015  Falls in the past year? 0 0 No No No  Follow up Falls evaluation completed Falls evaluation completed - - -    Timed Get Up and Go Performed no, virtual visit  Cognitive Function:     6CIT Screen 02/07/2020  What Year? 0 points  What month? 0 points  What time? 0 points  Count back from 20 0 points  Months in reverse 0 points  Repeat phrase 0 points  Total Score 0    Screening Tests Health Maintenance  Topic Date Due  . MAMMOGRAM  10/28/2020  . TETANUS/TDAP  04/17/2021  . COLONOSCOPY  02/21/2029  . INFLUENZA VACCINE  Completed  . DEXA SCAN  Completed  . Hepatitis C Screening  Completed  . PNA vac Low Risk Adult  Completed      Plan:   Keep all routine maintenance appointments.   Next scheduled lab 03/01/20  Follow up 07/08/20  Medicare Attestation I have personally reviewed: The patient's medical and social history Their use of alcohol, tobacco or illicit drugs Their current medications and supplements The patient's functional ability including ADLs,fall risks, home safety risks, cognitive, and hearing and visual impairment Diet and physical activities Evidence for depression   I have reviewed and discussed with patient certain preventive protocols, quality metrics, and best practice recommendations.   Varney Biles, LPN   [id number]    Reviewed above information.  Agree with assessment and plan.    Dr Nicki Reaper

## 2020-02-07 NOTE — Patient Instructions (Addendum)
  Ms. Lapides , Thank you for taking time to come for your Medicare Wellness Visit. I appreciate your ongoing commitment to your health goals. Please review the following plan we discussed and let me know if I can assist you in the future.   These are the goals we discussed: Goals    . Increase physical activity     Use stretch bands, peddler and hand weights for exercise.       This is a list of the screening recommended for you and due dates:  Health Maintenance  Topic Date Due  . Mammogram  10/28/2020  . Tetanus Vaccine  04/17/2021  . Colon Cancer Screening  02/21/2029  . Flu Shot  Completed  . DEXA scan (bone density measurement)  Completed  .  Hepatitis C: One time screening is recommended by Center for Disease Control  (CDC) for  adults born from [birth year] through 1964.   Completed  . Pneumonia vaccines  Completed

## 2020-02-21 ENCOUNTER — Other Ambulatory Visit: Payer: Self-pay

## 2020-02-21 ENCOUNTER — Other Ambulatory Visit (INDEPENDENT_AMBULATORY_CARE_PROVIDER_SITE_OTHER): Payer: Medicare HMO

## 2020-02-21 DIAGNOSIS — E7800 Pure hypercholesterolemia, unspecified: Secondary | ICD-10-CM

## 2020-02-21 LAB — HEPATIC FUNCTION PANEL
ALT: 12 U/L (ref 0–35)
AST: 14 U/L (ref 0–37)
Albumin: 4 g/dL (ref 3.5–5.2)
Alkaline Phosphatase: 54 U/L (ref 39–117)
Bilirubin, Direct: 0.1 mg/dL (ref 0.0–0.3)
Total Bilirubin: 0.6 mg/dL (ref 0.2–1.2)
Total Protein: 7.1 g/dL (ref 6.0–8.3)

## 2020-02-23 ENCOUNTER — Encounter: Payer: Self-pay | Admitting: Internal Medicine

## 2020-03-02 ENCOUNTER — Other Ambulatory Visit: Payer: Self-pay | Admitting: Internal Medicine

## 2020-03-10 ENCOUNTER — Encounter: Payer: Self-pay | Admitting: Internal Medicine

## 2020-03-10 ENCOUNTER — Other Ambulatory Visit: Payer: Self-pay | Admitting: Internal Medicine

## 2020-03-11 ENCOUNTER — Other Ambulatory Visit: Payer: Self-pay

## 2020-03-11 MED ORDER — ROSUVASTATIN CALCIUM 20 MG PO TABS: 20.0000 mg | ORAL_TABLET | Freq: Every day | ORAL | 5 refills | Status: DC

## 2020-03-11 NOTE — Telephone Encounter (Signed)
Discussed with pt. Corrected crestor rx.

## 2020-05-01 ENCOUNTER — Encounter: Payer: Self-pay | Admitting: Internal Medicine

## 2020-05-01 NOTE — Telephone Encounter (Signed)
Most common side effects are headache, fever, body aches and fatigue.  Symptoms can vary.  Can schedule visit if she wants to discuss specifics or feels needs to talk more or be evaluated.

## 2020-05-24 ENCOUNTER — Other Ambulatory Visit: Payer: Self-pay | Admitting: Internal Medicine

## 2020-05-24 DIAGNOSIS — R030 Elevated blood-pressure reading, without diagnosis of hypertension: Secondary | ICD-10-CM

## 2020-07-08 ENCOUNTER — Other Ambulatory Visit: Payer: Self-pay

## 2020-07-08 ENCOUNTER — Encounter: Payer: Self-pay | Admitting: Internal Medicine

## 2020-07-08 ENCOUNTER — Ambulatory Visit (INDEPENDENT_AMBULATORY_CARE_PROVIDER_SITE_OTHER): Payer: Medicare HMO | Admitting: Internal Medicine

## 2020-07-08 VITALS — BP 148/90 | HR 77 | Temp 98.2°F | Resp 16 | Ht 63.0 in | Wt 179.0 lb

## 2020-07-08 DIAGNOSIS — Z86010 Personal history of colonic polyps: Secondary | ICD-10-CM

## 2020-07-08 DIAGNOSIS — E7800 Pure hypercholesterolemia, unspecified: Secondary | ICD-10-CM

## 2020-07-08 DIAGNOSIS — I10 Essential (primary) hypertension: Secondary | ICD-10-CM

## 2020-07-08 DIAGNOSIS — R69 Illness, unspecified: Secondary | ICD-10-CM | POA: Diagnosis not present

## 2020-07-08 DIAGNOSIS — F419 Anxiety disorder, unspecified: Secondary | ICD-10-CM

## 2020-07-08 DIAGNOSIS — E039 Hypothyroidism, unspecified: Secondary | ICD-10-CM | POA: Diagnosis not present

## 2020-07-08 LAB — HEPATIC FUNCTION PANEL
ALT: 13 U/L (ref 0–35)
AST: 13 U/L (ref 0–37)
Albumin: 4.4 g/dL (ref 3.5–5.2)
Alkaline Phosphatase: 64 U/L (ref 39–117)
Bilirubin, Direct: 0.1 mg/dL (ref 0.0–0.3)
Total Bilirubin: 0.6 mg/dL (ref 0.2–1.2)
Total Protein: 7.3 g/dL (ref 6.0–8.3)

## 2020-07-08 LAB — BASIC METABOLIC PANEL
BUN: 10 mg/dL (ref 6–23)
CO2: 28 mEq/L (ref 19–32)
Calcium: 9.8 mg/dL (ref 8.4–10.5)
Chloride: 103 mEq/L (ref 96–112)
Creatinine, Ser: 0.98 mg/dL (ref 0.40–1.20)
GFR: 56.44 mL/min — ABNORMAL LOW (ref >60.00)
Glucose, Bld: 87 mg/dL (ref 70–99)
Potassium: 4.5 mEq/L (ref 3.5–5.1)
Sodium: 137 mEq/L (ref 135–145)

## 2020-07-08 LAB — LIPID PANEL
Cholesterol: 167 mg/dL (ref 0–200)
HDL: 60.5 mg/dL (ref >39.00)
LDL Cholesterol: 68 mg/dL (ref 0–99)
NonHDL: 106.8
Total CHOL/HDL Ratio: 3
Triglycerides: 192 mg/dL — ABNORMAL HIGH (ref 0.0–149.0)
VLDL: 38.4 mg/dL (ref 0.0–40.0)

## 2020-07-08 LAB — CBC WITH DIFFERENTIAL/PLATELET
Basophils Absolute: 0 10*3/uL (ref 0.0–0.1)
Basophils Relative: 0.4 % (ref 0.0–3.0)
Eosinophils Absolute: 0.1 10*3/uL (ref 0.0–0.7)
Eosinophils Relative: 1.2 % (ref 0.0–5.0)
HCT: 40.3 % (ref 36.0–46.0)
Hemoglobin: 13.5 g/dL (ref 12.0–15.0)
Lymphocytes Relative: 30.5 % (ref 12.0–46.0)
Lymphs Abs: 2.7 10*3/uL (ref 0.7–4.0)
MCHC: 33.5 g/dL (ref 30.0–36.0)
MCV: 92.2 fl (ref 78.0–100.0)
Monocytes Absolute: 0.6 10*3/uL (ref 0.1–1.0)
Monocytes Relative: 7.2 % (ref 3.0–12.0)
Neutro Abs: 5.3 10*3/uL (ref 1.4–7.7)
Neutrophils Relative %: 60.7 % (ref 43.0–77.0)
Platelets: 370 10*3/uL (ref 150.0–400.0)
RBC: 4.38 Mil/uL (ref 3.87–5.11)
RDW: 12.9 % (ref 11.5–15.5)
WBC: 8.8 10*3/uL (ref 4.0–10.5)

## 2020-07-08 LAB — TSH: TSH: 1.08 u[IU]/mL (ref 0.35–4.50)

## 2020-07-08 NOTE — Progress Notes (Signed)
Patient ID: Jill Torres, female   DOB: October 11, 1951, 69 y.o.   MRN: [id number]   Subjective:    Patient ID: Jill Torres, female    DOB: [birth year], 69 y.o.   MRN: [id number]  HPI This visit occurred during the SARS-CoV-2 public health emergency.  Safety protocols were in place, including screening questions prior to the visit, additional usage of staff PPE, and extensive cleaning of exam room while observing appropriate contact time as indicated for disinfecting solutions.  Patient here for a scheduled follow up.  She reports she is doing relatively well.  Staying active.  Keeping her grandchildren (ages 61 and 42).  No chest pain or sob.  No acid reflux.  No abdominal pain.  Bowels moving.  Doing better.  Controlling allergy sinus with otc medication.  Overall feels things are stable.     Past Medical History:  Diagnosis Date  . Allergy   . Arthritis   . Depression   . Diverticulosis of colon (without mention of hemorrhage)   . GERD (gastroesophageal reflux disease)   . History of chicken pox   . History of shingles may 2013  . HLD (hyperlipidemia)   . HTN (hypertension)   . Irritable bowel syndrome   . Unspecified hypothyroidism    Past Surgical History:  Procedure Laterality Date  . ABDOMINAL HYSTERECTOMY  1983   due to heavy bleeding & ovary cyst  . APPENDECTOMY  1974  . BLADDER DIVERTICULECTOMY  1990  . CESAREAN SECTION     x 2  . CHOLECYSTECTOMY    . DILATION AND CURETTAGE OF UTERUS     x 2  . DIRECT LARYNGOSCOPY Right 06/11/2015   Procedure: suspension microdirect laryngoscopy with biopsy of right tongue base;  Surgeon: Carloyn Manner, MD;  Location: [facility] ORS;  Service: ENT;  Laterality: Right;  . NASAL SEPTUM SURGERY  1999  . PARTIAL HYSTERECTOMY  1983   cyst on ovary and heavy bleeding    Family History  Problem Relation Age of Onset  . Arthritis Mother   . Hypertension Mother   . Hyperlipidemia Mother   . Atrial fibrillation Mother   . Heart disease  Mother   . Diabetes Mother   . Colon polyps Mother   . Hyperlipidemia Father   . Heart disease Father        s/p CABG  . Stroke Father   . Rheumatic fever Sister        s/p valve replacement  . Deep vein thrombosis Brother        after immobilization   . Colon polyps Brother   . Heart disease Brother   . Diabetes Maternal Grandfather   . Heart disease Brother   . Diabetes Maternal Aunt   . Diabetes Maternal Uncle   . Breast cancer Neg Hx   . Colon cancer Neg Hx   . Esophageal cancer Neg Hx   . Rectal cancer Neg Hx   . Stomach cancer Neg Hx    Social History   Socioeconomic History  . Marital status: Single    Spouse name: Not on file  . Number of children: 2  . Years of education: Not on file  . Highest education level: Not on file  Occupational History  . Occupation: retired    Comment: Chief of Staff   Tobacco Use  . Smoking status: Never Smoker  . Smokeless tobacco: Never Used  Substance and Sexual Activity  . Alcohol use: No    Alcohol/week: 0.0 standard  drinks  . Drug use: No  . Sexual activity: Not on file  Other Topics Concern  . Not on file  Social History Narrative   No regular exercise   Lives in noisy environment- very stressful    Daily caffeine use    Social Determinants of Health   Financial Resource Strain: Low Risk   . Difficulty of Paying Living Expenses: Not hard at all  Food Insecurity: No Food Insecurity  . Worried About Charity fundraiser in the Last Year: Never true  . Ran Out of Food in the Last Year: Never true  Transportation Needs: No Transportation Needs  . Lack of Transportation (Medical): No  . Lack of Transportation (Non-Medical): No  Physical Activity: Insufficiently Active  . Days of Exercise per Week: 4 days  . Minutes of Exercise per Session: 20 min  Stress: No Stress Concern Present  . Feeling of Stress : Not at all  Social Connections: Socially Integrated  . Frequency of Communication with Friends and  Family: More than three times a week  . Frequency of Social Gatherings with Friends and Family: More than three times a week  . Attends Religious Services: 1 to 4 times per year  . Active Member of Clubs or Organizations: Yes  . Attends Archivist Meetings: Not on file  . Marital Status: Married    Outpatient Encounter Medications as of 07/08/2020  Medication Sig  . AMBULATORY NON FORMULARY MEDICATION Allergy shots once a week  . amLODipine (NORVASC) 2.5 MG tablet TAKE 1 TABLET BY MOUTH EVERY DAY  . azelastine (ASTELIN) 0.1 % nasal spray PLACE 1 SPRAY INTO BOTH NOSTRILS 2 (TWO) TIMES DAILY. USE IN EACH NOSTRIL AS DIRECTED  . bifidobacterium infantis (ALIGN) capsule Take 1 capsule by mouth daily.    . calcium-vitamin D (OSCAL WITH D 500-200) 500-200 MG-UNIT per tablet Take 1 tablet by mouth daily.    . Coenzyme Q10 (COQ10 PO) Take by mouth daily.  . fish oil-omega-3 fatty acids 1000 MG capsule Take 2 g by mouth daily.  . Fluticasone Furoate (FLONASE SENSIMIST NA) Place into the nose.  . ibuprofen (ADVIL,MOTRIN) 200 MG tablet Take 200 mg by mouth as directed.    . multivitamin (THERAGRAN) per tablet Take 1 tablet by mouth daily.    Marland Kitchen neomycin-polymyxin-hydrocortisone (CORTISPORIN) OTIC solution Place 3 drops into the left ear 3 (three) times daily as needed.  . polyethylene glycol (MIRALAX) powder Take 17 g by mouth as needed.   . rosuvastatin (CRESTOR) 20 MG tablet Take 1 tablet (20 mg total) by mouth daily.   No facility-administered encounter medications on file as of 07/08/2020.    Review of Systems  Constitutional: Negative for appetite change and unexpected weight change.  HENT: Negative for congestion and sinus pressure.   Respiratory: Negative for cough, chest tightness and shortness of breath.   Cardiovascular: Negative for chest pain, palpitations and leg swelling.  Gastrointestinal: Negative for abdominal pain, diarrhea, nausea and vomiting.  Genitourinary: Negative  for difficulty urinating and dysuria.  Musculoskeletal: Negative for joint swelling and myalgias.  Skin: Negative for color change and rash.  Neurological: Negative for dizziness, light-headedness and headaches.  Psychiatric/Behavioral: Negative for agitation and dysphoric mood.       Objective:    Physical Exam Vitals reviewed.  Constitutional:      General: She is not in acute distress.    Appearance: Normal appearance.  HENT:     Nose: Nose normal.  Neck:  Thyroid: No thyromegaly.  Cardiovascular:     Rate and Rhythm: Normal rate and regular rhythm.  Pulmonary:     Effort: No respiratory distress.     Breath sounds: Normal breath sounds. No wheezing.  Abdominal:     General: Bowel sounds are normal.     Palpations: Abdomen is soft.     Tenderness: There is no abdominal tenderness.  Musculoskeletal:        General: No swelling or tenderness.     Cervical back: Neck supple.  Lymphadenopathy:     Cervical: No cervical adenopathy.  Skin:    Findings: No erythema or rash.  Neurological:     Mental Status: She is alert.  Psychiatric:        Mood and Affect: Mood normal.        Behavior: Behavior normal.     BP (!) 148/90   Pulse 77   Temp 98.2 F (36.8 C)   Resp 16   Ht 5\' 3"  (1.6 m)   Wt 179 lb (81.2 kg)   LMP 12/12/1981   SpO2 99%   BMI 31.71 kg/m  Wt Readings from Last 3 Encounters:  07/08/20 179 lb (81.2 kg)  02/07/20 170 lb (77.1 kg)  01/08/20 170 lb 9.6 oz (77.4 kg)     Lab Results  Component Value Date   WBC 8.8 07/08/2020   HGB 13.5 07/08/2020   HCT 40.3 07/08/2020   PLT 370.0 07/08/2020   GLUCOSE 87 07/08/2020   CHOL 167 07/08/2020   TRIG 192.0 (H) 07/08/2020   HDL 60.50 07/08/2020   LDLDIRECT 155.8 06/27/2013   LDLCALC 68 07/08/2020   ALT 13 07/08/2020   AST 13 07/08/2020   NA 137 07/08/2020   K 4.5 07/08/2020   CL 103 07/08/2020   CREATININE 0.98 07/08/2020   BUN 10 07/08/2020   CO2 28 07/08/2020   TSH 1.08 07/08/2020     DG Bone Density  Result Date: 10/29/2019 EXAM: DUAL X-RAY ABSORPTIOMETRY (DXA) FOR BONE MINERAL DENSITY IMPRESSION: Dear Dr Nicki Reaper, Your patient Brendi Mccarroll Gallen completed a FRAX assessment on 10/29/2019 using the Watson (analysis version: 14.10) manufactured by [employer]. The following summarizes the results of our evaluation. PATIENT BIOGRAPHICAL: Name: Layce, Sprung Patient ID: [id number] Birth Date: 25-Mar-1951 Height:    63.0 in. Gender:     Female    [age]        [age]       Weight:    172.3 lbs. Ethnicity:  White                            Exam Date: 10/29/2019 FRAX* RESULTS:  (version: 3.5) 10-year Probability of Fracture1 Major Osteoporotic Fracture2 Hip Fracture 18.1% 2.3% Population: [place] (Caucasian) Risk Factors: Family Hist. (Parent hip fracture) Based on Femur (Left) Neck BMD 1 -The 10-year probability of fracture may be lower than reported if the patient has received treatment. 2 -Major Osteoporotic Fracture: Clinical Spine, Forearm, Hip or Shoulder *FRAX is a Materials engineer of the [employer] for Metabolic Bone Disease, a [place] (WHO) [facility]. ASSESSMENT: The probability of a major osteoporotic fracture is 18.1% within the next ten years. The probability of a hip fracture is 2.3% within the next ten years. . Technologist: SCE PATIENT BIOGRAPHICAL: Name: Tahjae, Durr Patient ID: [id number] Birth Date: April 09, 1951 Height: 63.0 in. Gender: Female Exam Date: 10/29/2019 Weight: 172.3 lbs. Indications:  Caucasian, Height Loss, Hysterectomy, Parent Hip Fracture, Postmenopausal, Family Hist. (Parent hip fracture) Fractures: Treatments: calcium w/ vit D, Multi-Vitamin ASSESSMENT: The BMD measured at Femur Neck Left is 0.776 g/cm2 with a T-score of -1.9. This patient is considered osteopenic according to [facility]) criteria. The scan quality is good. Site Region Measured Measured WHO Young Adult  BMD Date       Age      Classification T-score AP Spine L1-L4 10/29/2019 67.2 Normal -0.2 1.175 g/cm2 DualFemur Neck Left 10/29/2019 67.2 Osteopenia -1.9 0.776 g/cm2 World Health Organization [facility]) criteria for post-menopausal, Caucasian Women: Normal:       T-score at or above -1 SD Osteopenia:   T-score between -1 and -2.5 SD Osteoporosis: T-score at or below -2.5 SD RECOMMENDATIONS: 1. All patients should optimize calcium and vitamin D intake. 2. Consider FDA-approved medical therapies in postmenopausal women and men aged 49 years and older, based on the following: a. A hip or vertebral(clinical or morphometric) fracture b. T-score < -2.5 at the femoral neck or spine after appropriate evaluation to exclude secondary causes c. Low bone mass (T-score between -1.0 and -2.5 at the femoral neck or spine) and a 10-year probability of a hip fracture > 3% or a 10-year probability of a major osteoporosis-related fracture > 20% based on the [place]-adapted WHO algorithm d. Clinician judgment and/or patient preferences may indicate treatment for people with 10-year fracture probabilities above or below these levels FOLLOW-UP: People with diagnosed cases of osteoporosis or at high risk for fracture should have regular bone mineral density tests. For patients eligible for Medicare, routine testing is allowed once every 2 years. The testing frequency can be increased to one year for patients who have rapidly progressing disease, those who are receiving or discontinuing medical therapy to restore bone mass, or have additional risk factors. I have reviewed this report, and agree with the above findings. [facility] Electronically Signed   By: Lowella Grip III M.D.   On: 10/29/2019 09:58   MM 3D SCREEN BREAST BILATERAL  Result Date: 10/29/2019 CLINICAL DATA:  Screening. EXAM: DIGITAL SCREENING BILATERAL MAMMOGRAM WITH TOMO AND CAD COMPARISON:  Previous exam(s). ACR Breast Density Category b: There are scattered  areas of fibroglandular density. FINDINGS: There are no findings suspicious for malignancy. Images were processed with CAD. IMPRESSION: No mammographic evidence of malignancy. A result letter of this screening mammogram will be mailed directly to the patient. RECOMMENDATION: Screening mammogram in one year. (Code:SM-B-01Y) BI-RADS CATEGORY  1: Negative. Electronically Signed   By: Curlene Dolphin M.D.   On: 10/29/2019 16:10       Assessment & Plan:   Problem List Items Addressed This Visit    Anxiety    On no medication.  Doing well.  Follow.        COLONIC POLYPS, HYPERPLASTIC, HX OF    Colonoscopy 02/2020 - One 81mm polyp in the cecum and diverticulosis.        HTN (hypertension)    Blood pressure on recheck improved.  On amlodipine.  Have her spot check her pressure.  Continue same medication.  Follow metabolic panel.       Relevant Orders   CBC with Differential/Platelet (Completed)   Basic metabolic panel (Completed)   Hypercholesterolemia    On pravastatin.  Low cholesterol diet and exercise.  Follow lipid panel and liver function tests.        Relevant Orders   Hepatic function panel (Completed)   Lipid panel (Completed)  Hypothyroidism - Primary    On no medication.  Check tsh to confirm wnl.       Relevant Orders   TSH (Completed)       [name], MD

## 2020-07-19 NOTE — Assessment & Plan Note (Signed)
Blood pressure on recheck improved.  On amlodipine.  Have her spot check her pressure.  Continue same medication.  Follow metabolic panel.

## 2020-07-19 NOTE — Assessment & Plan Note (Signed)
On pravastatin.  Low cholesterol diet and exercise.  Follow lipid panel and liver function tests.   

## 2020-07-19 NOTE — Assessment & Plan Note (Signed)
On no medication.  Check tsh to confirm wnl.

## 2020-07-19 NOTE — Assessment & Plan Note (Signed)
On no medication.  Doing well.  Follow.

## 2020-07-19 NOTE — Assessment & Plan Note (Signed)
Colonoscopy 02/2020 - One 36mm polyp in the cecum and diverticulosis.

## 2020-09-24 ENCOUNTER — Other Ambulatory Visit: Payer: Self-pay | Admitting: Internal Medicine

## 2020-09-24 DIAGNOSIS — Z1231 Encounter for screening mammogram for malignant neoplasm of breast: Secondary | ICD-10-CM

## 2020-09-30 DIAGNOSIS — Z23 Encounter for immunization: Secondary | ICD-10-CM | POA: Diagnosis not present

## 2020-10-01 DIAGNOSIS — R69 Illness, unspecified: Secondary | ICD-10-CM | POA: Diagnosis not present

## 2020-11-10 ENCOUNTER — Ambulatory Visit
Admission: RE | Admit: 2020-11-10 | Discharge: 2020-11-10 | Disposition: A | Discharge status: Home / Self Care | Payer: Medicare HMO | Source: Ambulatory Visit | Attending: Internal Medicine | Admitting: Internal Medicine

## 2020-11-10 ENCOUNTER — Other Ambulatory Visit: Payer: Self-pay

## 2020-11-10 DIAGNOSIS — Z1231 Encounter for screening mammogram for malignant neoplasm of breast: Secondary | ICD-10-CM | POA: Insufficient documentation

## 2020-11-11 ENCOUNTER — Encounter: Payer: Self-pay | Admitting: Internal Medicine

## 2020-11-11 ENCOUNTER — Ambulatory Visit (INDEPENDENT_AMBULATORY_CARE_PROVIDER_SITE_OTHER): Payer: Medicare HMO | Admitting: Internal Medicine

## 2020-11-11 VITALS — BP 134/78 | HR 78 | Temp 98.3°F | Resp 16 | Ht 63.0 in | Wt 182.0 lb

## 2020-11-11 DIAGNOSIS — K589 Irritable bowel syndrome without diarrhea: Secondary | ICD-10-CM

## 2020-11-11 DIAGNOSIS — E7800 Pure hypercholesterolemia, unspecified: Secondary | ICD-10-CM

## 2020-11-11 DIAGNOSIS — I10 Essential (primary) hypertension: Secondary | ICD-10-CM | POA: Diagnosis not present

## 2020-11-11 LAB — BASIC METABOLIC PANEL
BUN: 10 mg/dL (ref 6–23)
CO2: 24 mEq/L (ref 19–32)
Calcium: 9.3 mg/dL (ref 8.4–10.5)
Chloride: 104 mEq/L (ref 96–112)
Creatinine, Ser: 0.97 mg/dL (ref 0.40–1.20)
GFR: 60.13 mL/min (ref >60.00)
Glucose, Bld: 85 mg/dL (ref 70–99)
Potassium: 3.9 mEq/L (ref 3.5–5.1)
Sodium: 138 mEq/L (ref 135–145)

## 2020-11-11 LAB — HEPATIC FUNCTION PANEL
ALT: 13 U/L (ref 0–35)
AST: 15 U/L (ref 0–37)
Albumin: 4.2 g/dL (ref 3.5–5.2)
Alkaline Phosphatase: 60 U/L (ref 39–117)
Bilirubin, Direct: 0.1 mg/dL (ref 0.0–0.3)
Total Bilirubin: 1 mg/dL (ref 0.2–1.2)
Total Protein: 7 g/dL (ref 6.0–8.3)

## 2020-11-11 LAB — LIPID PANEL
Cholesterol: 147 mg/dL (ref 0–200)
HDL: 59.7 mg/dL (ref >39.00)
LDL Cholesterol: 52 mg/dL (ref 0–99)
NonHDL: 87.06
Total CHOL/HDL Ratio: 2
Triglycerides: 175 mg/dL — ABNORMAL HIGH (ref 0.0–149.0)
VLDL: 35 mg/dL (ref 0.0–40.0)

## 2020-11-11 NOTE — Assessment & Plan Note (Signed)
Blood pressure as outlined.  On amlodipine.  Follow pressures.  Follow metabolic panel.  

## 2020-11-11 NOTE — Assessment & Plan Note (Signed)
Bowels stable.  

## 2020-11-11 NOTE — Progress Notes (Signed)
Patient ID: Jill Torres, female   DOB: 05-Nov-1951, 69 y.o.   MRN: [id number]   Subjective:    Patient ID: Jill Torres, female    DOB: 1951-02-18, 69 y.o.   MRN: [id number]  HPI This visit occurred during the SARS-CoV-2 public health emergency.  Safety protocols were in place, including screening questions prior to the visit, additional usage of staff PPE, and extensive cleaning of exam room while observing appropriate contact time as indicated for disinfecting solutions.  Patient here for a scheduled follow up.  Here to f/u regarding her blood pressure.  She reports she is doing relatively well.  Not exercising as much.  Plans to restart.  (Silver Sneakers).  No chest pain or sob with increased activity or exertion.  No acid reflux.  No abdominal pain.  Bowels moving.    Past Medical History:  Diagnosis Date  . Allergy   . Arthritis   . Depression   . Diverticulosis of colon (without mention of hemorrhage)   . GERD (gastroesophageal reflux disease)   . History of chicken pox   . History of shingles may 2013  . HLD (hyperlipidemia)   . HTN (hypertension)   . Irritable bowel syndrome   . Unspecified hypothyroidism    Past Surgical History:  Procedure Laterality Date  . ABDOMINAL HYSTERECTOMY  1983   due to heavy bleeding & ovary cyst  . APPENDECTOMY  1974  . BLADDER DIVERTICULECTOMY  1990  . CESAREAN SECTION     x 2  . CHOLECYSTECTOMY    . DILATION AND CURETTAGE OF UTERUS     x 2  . DIRECT LARYNGOSCOPY Right 06/11/2015   Procedure: suspension microdirect laryngoscopy with biopsy of right tongue base;  Surgeon: Carloyn Manner, MD;  Location: [facility] ORS;  Service: ENT;  Laterality: Right;  . NASAL SEPTUM SURGERY  1999  . PARTIAL HYSTERECTOMY  1983   cyst on ovary and heavy bleeding    Family History  Problem Relation Age of Onset  . Arthritis Mother   . Hypertension Mother   . Hyperlipidemia Mother   . Atrial fibrillation Mother   . Heart disease Mother   . Diabetes  Mother   . Colon polyps Mother   . Hyperlipidemia Father   . Heart disease Father        s/p CABG  . Stroke Father   . Rheumatic fever Sister        s/p valve replacement  . Deep vein thrombosis Brother        after immobilization   . Colon polyps Brother   . Heart disease Brother   . Diabetes Maternal Grandfather   . Heart disease Brother   . Diabetes Maternal Aunt   . Diabetes Maternal Uncle   . Breast cancer Neg Hx   . Colon cancer Neg Hx   . Esophageal cancer Neg Hx   . Rectal cancer Neg Hx   . Stomach cancer Neg Hx    Social History   Socioeconomic History  . Marital status: Single    Spouse name: Not on file  . Number of children: 2  . Years of education: Not on file  . Highest education level: Not on file  Occupational History  . Occupation: retired    Comment: Chief of Staff   Tobacco Use  . Smoking status: Never Smoker  . Smokeless tobacco: Never Used  Substance and Sexual Activity  . Alcohol use: No    Alcohol/week: 0.0 standard drinks  .  Drug use: No  . Sexual activity: Not on file  Other Topics Concern  . Not on file  Social History Narrative   No regular exercise   Lives in noisy environment- very stressful    Daily caffeine use    Social Determinants of Health   Financial Resource Strain: Low Risk   . Difficulty of Paying Living Expenses: Not hard at all  Food Insecurity: No Food Insecurity  . Worried About Charity fundraiser in the Last Year: Never true  . Ran Out of Food in the Last Year: Never true  Transportation Needs: No Transportation Needs  . Lack of Transportation (Medical): No  . Lack of Transportation (Non-Medical): No  Physical Activity: Insufficiently Active  . Days of Exercise per Week: 4 days  . Minutes of Exercise per Session: 20 min  Stress: No Stress Concern Present  . Feeling of Stress : Not at all  Social Connections: Socially Integrated  . Frequency of Communication with Friends and Family: More than three  times a week  . Frequency of Social Gatherings with Friends and Family: More than three times a week  . Attends Religious Services: 1 to 4 times per year  . Active Member of Clubs or Organizations: Yes  . Attends Archivist Meetings: Not on file  . Marital Status: Married    Outpatient Encounter Medications as of 11/11/2020  Medication Sig  . amLODipine (NORVASC) 2.5 MG tablet TAKE 1 TABLET BY MOUTH EVERY DAY  . Ascorbic Acid (VITAMIN C) 1000 MG tablet Take 1,000 mg by mouth daily.  . bifidobacterium infantis (ALIGN) capsule Take 1 capsule by mouth daily.    . calcium-vitamin D (OSCAL WITH D 500-200) 500-200 MG-UNIT per tablet Take 1 tablet by mouth daily.    . Coenzyme Q10 (COQ10 PO) Take by mouth daily.  . fish oil-omega-3 fatty acids 1000 MG capsule Take 2 g by mouth daily.  . Fluticasone Furoate (FLONASE SENSIMIST NA) Place into the nose.  . multivitamin (THERAGRAN) per tablet Take 1 tablet by mouth daily.    Marland Kitchen neomycin-polymyxin-hydrocortisone (CORTISPORIN) OTIC solution Place 3 drops into the left ear 3 (three) times daily as needed.  . polyethylene glycol (MIRALAX) powder Take 17 g by mouth as needed.   . rosuvastatin (CRESTOR) 20 MG tablet Take 1 tablet (20 mg total) by mouth daily.  Marland Kitchen zinc gluconate 50 MG tablet Take 50 mg by mouth daily.  . [DISCONTINUED] AMBULATORY NON FORMULARY MEDICATION Allergy shots once a week  . [DISCONTINUED] azelastine (ASTELIN) 0.1 % nasal spray PLACE 1 SPRAY INTO BOTH NOSTRILS 2 (TWO) TIMES DAILY. USE IN EACH NOSTRIL AS DIRECTED  . [DISCONTINUED] ibuprofen (ADVIL,MOTRIN) 200 MG tablet Take 200 mg by mouth as directed.     No facility-administered encounter medications on file as of 11/11/2020.    Review of Systems  Constitutional: Negative for appetite change and unexpected weight change.  HENT: Negative for congestion and sinus pressure.   Respiratory: Negative for cough, chest tightness and shortness of breath.   Cardiovascular:  Negative for chest pain, palpitations and leg swelling.  Gastrointestinal: Negative for abdominal pain, diarrhea, nausea and vomiting.  Genitourinary: Negative for difficulty urinating and dysuria.  Musculoskeletal: Negative for joint swelling and myalgias.  Skin: Negative for color change and rash.  Neurological: Negative for dizziness, light-headedness and headaches.  Psychiatric/Behavioral: Negative for agitation and dysphoric mood.       Objective:    Physical Exam Vitals reviewed.  Constitutional:  General: She is not in acute distress.    Appearance: Normal appearance.  HENT:     Head: Normocephalic and atraumatic.     Right Ear: External ear normal.     Left Ear: External ear normal.  Eyes:     General: No scleral icterus.       Right eye: No discharge.        Left eye: No discharge.     Conjunctiva/sclera: Conjunctivae normal.  Neck:     Thyroid: No thyromegaly.  Cardiovascular:     Rate and Rhythm: Normal rate and regular rhythm.  Pulmonary:     Effort: No respiratory distress.     Breath sounds: Normal breath sounds. No wheezing.  Abdominal:     General: Bowel sounds are normal.     Palpations: Abdomen is soft.     Tenderness: There is no abdominal tenderness.  Musculoskeletal:        General: No swelling or tenderness.     Cervical back: Neck supple. No tenderness.  Lymphadenopathy:     Cervical: No cervical adenopathy.  Skin:    Findings: No erythema or rash.  Neurological:     Mental Status: She is alert.  Psychiatric:        Mood and Affect: Mood normal.        Behavior: Behavior normal.     BP 134/78   Pulse 78   Temp 98.3 F (36.8 C) (Oral)   Resp 16   Ht 5\' 3"  (1.6 m)   Wt 182 lb (82.6 kg)   LMP 12/12/1981   SpO2 98%   BMI 32.24 kg/m  Wt Readings from Last 3 Encounters:  11/11/20 182 lb (82.6 kg)  07/08/20 179 lb (81.2 kg)  02/07/20 170 lb (77.1 kg)     Lab Results  Component Value Date   WBC 8.8 07/08/2020   HGB 13.5  07/08/2020   HCT 40.3 07/08/2020   PLT 370.0 07/08/2020   GLUCOSE 85 11/11/2020   CHOL 147 11/11/2020   TRIG 175.0 (H) 11/11/2020   HDL 59.70 11/11/2020   LDLDIRECT 155.8 06/27/2013   LDLCALC 52 11/11/2020   ALT 13 11/11/2020   AST 15 11/11/2020   NA 138 11/11/2020   K 3.9 11/11/2020   CL 104 11/11/2020   CREATININE 0.97 11/11/2020   BUN 10 11/11/2020   CO2 24 11/11/2020   TSH 1.08 07/08/2020    MM 3D SCREEN BREAST BILATERAL  Result Date: 11/10/2020 CLINICAL DATA:  Screening. EXAM: DIGITAL SCREENING BILATERAL MAMMOGRAM WITH TOMO AND CAD COMPARISON:  Previous exam(s). ACR Breast Density Category c: The breast tissue is heterogeneously dense, which may obscure small masses. FINDINGS: There are no findings suspicious for malignancy. Images were processed with CAD. IMPRESSION: No mammographic evidence of malignancy. A result letter of this screening mammogram will be mailed directly to the patient. RECOMMENDATION: Screening mammogram in one year. (Code:SM-B-01Y) BI-RADS CATEGORY  1: Negative. Electronically Signed   By: Kristopher Oppenheim M.D.   On: 11/10/2020 14:16       Assessment & Plan:   Problem List Items Addressed This Visit    IRRITABLE BOWEL SYNDROME    Bowels stable.        Hypercholesterolemia - Primary    On pravastatin.  Low cholesterol diet and exercise.  Follow lipid panel and liver function tests.        Relevant Orders   Hepatic function panel (Completed)   Lipid panel (Completed)   HTN (hypertension)  Blood pressure as outlined.  On amlodipine.  Follow pressures.  Follow metabolic panel.        Relevant Orders   Basic metabolic panel (Completed)       [name], MD

## 2020-11-11 NOTE — Assessment & Plan Note (Signed)
On pravastatin.  Low cholesterol diet and exercise.  Follow lipid panel and liver function tests.   

## 2020-11-16 ENCOUNTER — Other Ambulatory Visit: Payer: Self-pay | Admitting: Internal Medicine

## 2020-11-16 DIAGNOSIS — R030 Elevated blood-pressure reading, without diagnosis of hypertension: Secondary | ICD-10-CM

## 2021-02-09 ENCOUNTER — Ambulatory Visit (INDEPENDENT_AMBULATORY_CARE_PROVIDER_SITE_OTHER): Payer: Medicare HMO

## 2021-02-09 VITALS — Ht 63.0 in | Wt 182.0 lb

## 2021-02-09 DIAGNOSIS — Z0000 Encounter for general adult medical examination without abnormal findings: Secondary | ICD-10-CM | POA: Diagnosis not present

## 2021-02-09 NOTE — Patient Instructions (Addendum)
Jill Torres , Thank you for taking time to come for your Medicare Wellness Visit. I appreciate your ongoing commitment to your health goals. Please review the following plan we discussed and let me know if I can assist you in the future.   These are the goals we discussed: Goals    . Increase physical activity     Use stretch bands, peddler and hand weights for exercise.       This is a list of the screening recommended for you and due dates:  Health Maintenance  Topic Date Due  . Tetanus Vaccine  04/17/2021  . Mammogram  11/10/2021  . Colon Cancer Screening  02/21/2029  . Flu Shot  Completed  . DEXA scan (bone density measurement)  Completed  .  Hepatitis C: One time screening is recommended by Center for Disease Control  (CDC) for  adults born from [birth year] through 1964.   Completed  . Pneumonia vaccines  Completed  . HPV Vaccine  Aged Out  . COVID-19 Vaccine  Discontinued    Immunizations Immunization History  Administered Date(s) Administered  . Influenza Split 08/29/2014  . Influenza, High Dose Seasonal PF 08/16/2018, 09/30/2020  . Influenza,inj,Quad PF,6+ Mos 08/13/2019  . Influenza-Unspecified 08/27/2015, 08/21/2017  . PFIZER(Purple Top)SARS-COV-2 Vaccination 04/23/2020, 05/14/2020  . Pneumococcal Conjugate-13 04/11/2018  . Pneumococcal Polysaccharide-23 07/26/2019  . Tdap 04/18/2011    Keep all routine maintenance appointments.   Cpe 03/15/21 @ 8:30  Advanced directives: not yet completed  Conditions/risks identified: none new  Follow up in one year for your annual wellness visit.   Preventive Care 32 Years and Older, Female Preventive care refers to lifestyle choices and visits with your health care provider that can promote health and wellness. What does preventive care include?  A yearly physical exam. This is also called an annual well check.  Dental exams once or twice a year.  Routine eye exams. Ask your health care provider how often you should  have your eyes checked.  Personal lifestyle choices, including:  Daily care of your teeth and gums.  Regular physical activity.  Eating a healthy diet.  Avoiding tobacco and drug use.  Limiting alcohol use.  Practicing safe sex.  Taking low-dose aspirin every day.  Taking vitamin and mineral supplements as recommended by your health care provider. What happens during an annual well check? The services and screenings done by your health care provider during your annual well check will depend on your age, overall health, lifestyle risk factors, and family history of disease. Counseling  Your health care provider may ask you questions about your:  Alcohol use.  Tobacco use.  Drug use.  Emotional well-being.  Home and relationship well-being.  Sexual activity.  Eating habits.  History of falls.  Memory and ability to understand (cognition).  Work and work Statistician.  Reproductive health. Screening  You may have the following tests or measurements:  Height, weight, and BMI.  Blood pressure.  Lipid and cholesterol levels. These may be checked every 5 years, or more frequently if you are over 1 years old.  Skin check.  Lung cancer screening. You may have this screening every year starting at age 28 if you have a 30-pack-year history of smoking and currently smoke or have quit within the past 15 years.  Fecal occult blood test (FOBT) of the stool. You may have this test every year starting at age 45.  Flexible sigmoidoscopy or colonoscopy. You may have a sigmoidoscopy every 5 years or a  colonoscopy every 10 years starting at age 42.  Hepatitis C blood test.  Hepatitis B blood test.  Sexually transmitted disease (STD) testing.  Diabetes screening. This is done by checking your blood sugar (glucose) after you have not eaten for a while (fasting). You may have this done every 1-3 years.  Bone density scan. This is done to screen for osteoporosis. You may  have this done starting at age 36.  Mammogram. This may be done every 1-2 years. Talk to your health care provider about how often you should have regular mammograms. Talk with your health care provider about your test results, treatment options, and if necessary, the need for more tests. Vaccines  Your health care provider may recommend certain vaccines, such as:  Influenza vaccine. This is recommended every year.  Tetanus, diphtheria, and acellular pertussis (Tdap, Td) vaccine. You may need a Td booster every 10 years.  Zoster vaccine. You may need this after age 53.  Pneumococcal 13-valent conjugate (PCV13) vaccine. One dose is recommended after age 70.  Pneumococcal polysaccharide (PPSV23) vaccine. One dose is recommended after age 39. Talk to your health care provider about which screenings and vaccines you need and how often you need them. This information is not intended to replace advice given to you by your health care provider. Make sure you discuss any questions you have with your health care provider. Document Released: 12/25/2015 Document Revised: 08/17/2016 Document Reviewed: 09/29/2015 Elsevier Interactive Patient Education  2017 [place] Prevention in the Home Falls can cause injuries. They can happen to people of all ages. There are many things you can do to make your home safe and to help prevent falls. What can I do on the outside of my home?  Regularly fix the edges of walkways and driveways and fix any cracks.  Remove anything that might make you trip as you walk through a door, such as a raised step or threshold.  Trim any bushes or trees on the path to your home.  Use bright outdoor lighting.  Clear any walking paths of anything that might make someone trip, such as rocks or tools.  Regularly check to see if handrails are loose or broken. Make sure that both sides of any steps have handrails.  Any raised decks and porches should have guardrails  on the edges.  Have any leaves, snow, or ice cleared regularly.  Use sand or salt on walking paths during winter.  Clean up any spills in your garage right away. This includes oil or grease spills. What can I do in the bathroom?  Use night lights.  Install grab bars by the toilet and in the tub and shower. Do not use towel bars as grab bars.  Use non-skid mats or decals in the tub or shower.  If you need to sit down in the shower, use a plastic, non-slip stool.  Keep the floor dry. Clean up any water that spills on the floor as soon as it happens.  Remove soap buildup in the tub or shower regularly.  Attach bath mats securely with double-sided non-slip rug tape.  Do not have throw rugs and other things on the floor that can make you trip. What can I do in the bedroom?  Use night lights.  Make sure that you have a light by your bed that is easy to reach.  Do not use any sheets or blankets that are too big for your bed. They should not hang down onto the  floor.  Have a firm chair that has side arms. You can use this for support while you get dressed.  Do not have throw rugs and other things on the floor that can make you trip. What can I do in the kitchen?  Clean up any spills right away.  Avoid walking on wet floors.  Keep items that you use a lot in easy-to-reach places.  If you need to reach something above you, use a strong step stool that has a grab bar.  Keep electrical cords out of the way.  Do not use floor polish or wax that makes floors slippery. If you must use wax, use non-skid floor wax.  Do not have throw rugs and other things on the floor that can make you trip. What can I do with my stairs?  Do not leave any items on the stairs.  Make sure that there are handrails on both sides of the stairs and use them. Fix handrails that are broken or loose. Make sure that handrails are as long as the stairways.  Check any carpeting to make sure that it is  firmly attached to the stairs. Fix any carpet that is loose or worn.  Avoid having throw rugs at the top or bottom of the stairs. If you do have throw rugs, attach them to the floor with carpet tape.  Make sure that you have a light switch at the top of the stairs and the bottom of the stairs. If you do not have them, ask someone to add them for you. What else can I do to help prevent falls?  Wear shoes that:  Do not have high heels.  Have rubber bottoms.  Are comfortable and fit you well.  Are closed at the toe. Do not wear sandals.  If you use a stepladder:  Make sure that it is fully opened. Do not climb a closed stepladder.  Make sure that both sides of the stepladder are locked into place.  Ask someone to hold it for you, if possible.  Clearly mark and make sure that you can see:  Any grab bars or handrails.  First and last steps.  Where the edge of each step is.  Use tools that help you move around (mobility aids) if they are needed. These include:  Canes.  Walkers.  Scooters.  Crutches.  Turn on the lights when you go into a dark area. Replace any light bulbs as soon as they burn out.  Set up your furniture so you have a clear path. Avoid moving your furniture around.  If any of your floors are uneven, fix them.  If there are any pets around you, be aware of where they are.  Review your medicines with your doctor. Some medicines can make you feel dizzy. This can increase your chance of falling. Ask your doctor what other things that you can do to help prevent falls. This information is not intended to replace advice given to you by your health care provider. Make sure you discuss any questions you have with your health care provider. Document Released: 09/24/2009 Document Revised: 05/05/2016 Document Reviewed: 01/02/2015 Elsevier Interactive Patient Education  2017 [employer].

## 2021-02-09 NOTE — Progress Notes (Signed)
Subjective:   Jill Torres is a 70 y.o. female who presents for Medicare Annual (Subsequent) preventive examination.  Review of Systems    No ROS.  Medicare Wellness Virtual Visit.   Cardiac Risk Factors include: advanced age (>56men, >66 women);hypertension     Objective:    Today's Vitals   02/09/21 0840  Weight: 182 lb (82.6 kg)  Height: 5\' 3"  (1.6 m)   Body mass index is 32.24 kg/m.  Advanced Directives 02/09/2021 02/07/2020 06/11/2015 06/04/2015  Does Patient Have a Medical Advance Directive? No No No No  Would patient like information on creating a medical advance directive? No - Patient declined No - Patient declined No - patient declined information -    Current Medications (verified) Outpatient Encounter Medications as of 02/09/2021  Medication Sig  . amLODipine (NORVASC) 2.5 MG tablet TAKE 1 TABLET BY MOUTH EVERY DAY  . Ascorbic Acid (VITAMIN C) 1000 MG tablet Take 1,000 mg by mouth daily.  . bifidobacterium infantis (ALIGN) capsule Take 1 capsule by mouth daily.    . calcium-vitamin D (OSCAL WITH D 500-200) 500-200 MG-UNIT per tablet Take 1 tablet by mouth daily.    . Coenzyme Q10 (COQ10 PO) Take by mouth daily.  . fish oil-omega-3 fatty acids 1000 MG capsule Take 2 g by mouth daily.  . Fluticasone Furoate (FLONASE SENSIMIST NA) Place into the nose.  . multivitamin (THERAGRAN) per tablet Take 1 tablet by mouth daily.    Marland Kitchen neomycin-polymyxin-hydrocortisone (CORTISPORIN) OTIC solution Place 3 drops into the left ear 3 (three) times daily as needed.  . polyethylene glycol (MIRALAX) powder Take 17 g by mouth as needed.   . rosuvastatin (CRESTOR) 20 MG tablet TAKE 1 TABLET BY MOUTH EVERY DAY  . zinc gluconate 50 MG tablet Take 50 mg by mouth daily.   No facility-administered encounter medications on file as of 02/09/2021.    Allergies (verified) Patient has no known allergies.   History: Past Medical History:  Diagnosis Date  . Allergy   . Arthritis   .  Depression   . Diverticulosis of colon (without mention of hemorrhage)   . GERD (gastroesophageal reflux disease)   . History of chicken pox   . History of shingles may 2013  . HLD (hyperlipidemia)   . HTN (hypertension)   . Irritable bowel syndrome   . Unspecified hypothyroidism    Past Surgical History:  Procedure Laterality Date  . ABDOMINAL HYSTERECTOMY  1983   due to heavy bleeding & ovary cyst  . APPENDECTOMY  1974  . BLADDER DIVERTICULECTOMY  1990  . CESAREAN SECTION     x 2  . CHOLECYSTECTOMY    . DILATION AND CURETTAGE OF UTERUS     x 2  . DIRECT LARYNGOSCOPY Right 06/11/2015   Procedure: suspension microdirect laryngoscopy with biopsy of right tongue base;  Surgeon: Carloyn Manner, MD;  Location: [facility] ORS;  Service: ENT;  Laterality: Right;  . NASAL SEPTUM SURGERY  1999  . PARTIAL HYSTERECTOMY  1983   cyst on ovary and heavy bleeding    Family History  Problem Relation Age of Onset  . Arthritis Mother   . Hypertension Mother   . Hyperlipidemia Mother   . Atrial fibrillation Mother   . Heart disease Mother   . Diabetes Mother   . Colon polyps Mother   . Hyperlipidemia Father   . Heart disease Father        s/p CABG  . Stroke Father   . Rheumatic fever  Sister        s/p valve replacement  . Deep vein thrombosis Brother        after immobilization   . Colon polyps Brother   . Heart disease Brother   . Diabetes Maternal Grandfather   . Heart disease Brother   . Diabetes Maternal Aunt   . Diabetes Maternal Uncle   . Breast cancer Neg Hx   . Colon cancer Neg Hx   . Esophageal cancer Neg Hx   . Rectal cancer Neg Hx   . Stomach cancer Neg Hx    Social History   Socioeconomic History  . Marital status: Single    Spouse name: Not on file  . Number of children: 2  . Years of education: Not on file  . Highest education level: Not on file  Occupational History  . Occupation: retired    Comment: Chief of Staff   Tobacco Use  . Smoking status:  Never Smoker  . Smokeless tobacco: Never Used  Substance and Sexual Activity  . Alcohol use: No    Alcohol/week: 0.0 standard drinks  . Drug use: No  . Sexual activity: Not on file  Other Topics Concern  . Not on file  Social History Narrative   No regular exercise   Lives in noisy environment- very stressful    Daily caffeine use    Social Determinants of Health   Financial Resource Strain: Low Risk   . Difficulty of Paying Living Expenses: Not hard at all  Food Insecurity: No Food Insecurity  . Worried About Charity fundraiser in the Last Year: Never true  . Ran Out of Food in the Last Year: Never true  Transportation Needs: No Transportation Needs  . Lack of Transportation (Medical): No  . Lack of Transportation (Non-Medical): No  Physical Activity: Insufficiently Active  . Days of Exercise per Week: 4 days  . Minutes of Exercise per Session: 20 min  Stress: No Stress Concern Present  . Feeling of Stress : Not at all  Social Connections: Unknown  . Frequency of Communication with Friends and Family: More than three times a week  . Frequency of Social Gatherings with Friends and Family: More than three times a week  . Attends Religious Services: 1 to 4 times per year  . Active Member of Clubs or Organizations: Yes  . Attends Archivist Meetings: Not on file  . Marital Status: Not on file    Tobacco Counseling Counseling given: Not Answered   Clinical Intake:  Pre-visit preparation completed: Yes        Diabetes: No  How often do you need to have someone help you when you read instructions, pamphlets, or other written materials from your doctor or pharmacy?: 1 - Never   Interpreter Needed?: No      Activities of Daily Living In your present state of health, do you have any difficulty performing the following activities: 02/09/2021  Hearing? N  Vision? N  Difficulty concentrating or making decisions? N  Walking or climbing stairs? N  Dressing  or bathing? N  Doing errands, shopping? N  Preparing Food and eating ? N  Using the Toilet? N  In the past six months, have you accidently leaked urine? N  Do you have problems with loss of bowel control? N  Managing your Medications? N  Managing your Finances? N  Housekeeping or managing your Housekeeping? N  Some recent data might be hidden    Patient Care  Team: [name], MD as PCP - General (Internal Medicine)  Indicate any recent Medical Services you may have received from other than [facility] providers in the past year (date may be approximate).     Assessment:   This is a routine wellness examination for Adelia.  I connected with Rakayla today by telephone and verified that I am speaking with the correct person using two identifiers. Location patient: home Location provider: work Persons participating in the virtual visit: patient, Marine scientist.    I discussed the limitations, risks, security and privacy concerns of performing an evaluation and management service by telephone and the availability of in person appointments. The patient expressed understanding and verbally consented to this telephonic visit.    Interactive audio and video telecommunications were attempted between this provider and patient, however failed, due to patient having technical difficulties OR patient did not have access to video capability.  We continued and completed visit with audio only.  Some vital signs may be absent or patient reported.   Hearing/Vision screen  Hearing Screening   125Hz  250Hz  500Hz  1000Hz  2000Hz  3000Hz  4000Hz  6000Hz  8000Hz   Right ear:           Left ear:           Comments: Patient is able to hear conversational tones without difficulty.  No issues reported.   Vision Screening Comments: Followed by Dr. Matilde Sprang Wears corrective lenses Visual acuity not assessed, virtual visit.       Dietary issues and exercise activities discussed: Current Exercise Habits: Home exercise  routine, Type of exercise: walking, Intensity: Moderate  Healthy diet Good water intake  Goals    . Increase physical activity     Use stretch bands, peddler and hand weights for exercise.      Depression Screen PHQ 2/9 Scores 02/09/2021 02/07/2020 02/07/2020 02/13/2017 10/08/2015  PHQ - 2 Score 0 0 0 0 0    Fall Risk Fall Risk  02/09/2021 02/07/2020 12/04/2019 04/11/2018 02/13/2017  Falls in the past year? 0 0 0 No No  Number falls in past yr: 0 - - - -  Injury with Fall? 0 - - - -  Follow up Falls evaluation completed Falls evaluation completed Falls evaluation completed - -    FALL RISK PREVENTION PERTAINING TO THE HOME: Handrails in use when climbing stairs? Yes Home free of loose throw rugs in walkways, pet beds, electrical cords, etc? Yes  Adequate lighting in your home to reduce risk of falls? Yes   ASSISTIVE DEVICES UTILIZED TO PREVENT FALLS: Life alert? No  Use of a cane, walker or w/c? No   TIMED UP AND GO: Was the test performed? No . Virtual visit.   Cognitive Function:  Patient is alert and oriented x3.  Denies difficulty focusing, making decisions, memory loss.  Enjoys reading.  MMSE/6CIT deferred. Normal by direct communication/observation.    6CIT Screen 02/07/2020  What Year? 0 points  What month? 0 points  What time? 0 points  Count back from 20 0 points  Months in reverse 0 points  Repeat phrase 0 points  Total Score 0    Immunizations Immunization History  Administered Date(s) Administered  . Influenza Split 08/29/2014  . Influenza, High Dose Seasonal PF 08/16/2018, 09/30/2020  . Influenza,inj,Quad PF,6+ Mos 08/13/2019  . Influenza-Unspecified 08/27/2015, 08/21/2017  . PFIZER(Purple Top)SARS-COV-2 Vaccination 04/23/2020, 05/14/2020  . Pneumococcal Conjugate-13 04/11/2018  . Pneumococcal Polysaccharide-23 07/26/2019  . Tdap 04/18/2011   Health Maintenance Health Maintenance  Topic Date Due  .  TETANUS/TDAP  04/17/2021  . MAMMOGRAM  11/10/2021  .  COLONOSCOPY (Pts 45-73yrs Insurance coverage will need to be confirmed)  02/21/2029  . INFLUENZA VACCINE  Completed  . DEXA SCAN  Completed  . Hepatitis C Screening  Completed  . PNA vac Low Risk Adult  Completed  . HPV VACCINES  Aged Out  . COVID-19 Vaccine  Discontinued   Colorectal cancer screening: Type of screening: Colonoscopy. Completed 02/22/19. Repeat every 10 years  Mammogram status: Completed 11/10/20. Repeat every year.  Lung Cancer Screening: (Low Dose CT Chest recommended if Age 64-80 years, 30 pack-year currently smoking OR have quit w/in 15years.) does not qualify.   Vision Screening: Recommended annual ophthalmology exams for early detection of glaucoma and other disorders of the eye. Is the patient up to date with their annual eye exam?  Yes  Who is the provider or what is the name of the office in which the patient attends annual eye exams? Dr. Matilde Sprang  Dental Screening: Recommended annual dental exams for proper oral hygiene.  Community Resource Referral / Chronic Care Management: CRR required this visit?  No   CCM required this visit?  No      Plan:   Keep all routine maintenance appointments.   Cpe 03/15/21 @ 8:30  I have personally reviewed and noted the following in the patient's chart:   . Medical and social history . Use of alcohol, tobacco or illicit drugs  . Current medications and supplements-no opioids in use . Functional ability and status . Nutritional status . Physical activity . Advanced directives . List of other physicians . Hospitalizations, surgeries, and ER visits in previous 12 months . Vitals . Screenings to include cognitive, depression, and falls . Referrals and appointments  In addition, I have reviewed and discussed with patient certain preventive protocols, quality metrics, and best practice recommendations. A written personalized care plan for preventive services as well as general preventive health recommendations were provided  to patient via mchart.     Varney Biles, LPN   0/06/1218

## 2021-03-15 ENCOUNTER — Ambulatory Visit (INDEPENDENT_AMBULATORY_CARE_PROVIDER_SITE_OTHER): Payer: Medicare HMO | Admitting: Internal Medicine

## 2021-03-15 ENCOUNTER — Other Ambulatory Visit: Payer: Self-pay

## 2021-03-15 VITALS — BP 136/70 | HR 88 | Temp 98.0°F | Resp 16 | Ht 63.0 in | Wt 183.2 lb

## 2021-03-15 DIAGNOSIS — K219 Gastro-esophageal reflux disease without esophagitis: Secondary | ICD-10-CM | POA: Diagnosis not present

## 2021-03-15 DIAGNOSIS — I10 Essential (primary) hypertension: Secondary | ICD-10-CM

## 2021-03-15 DIAGNOSIS — E039 Hypothyroidism, unspecified: Secondary | ICD-10-CM | POA: Diagnosis not present

## 2021-03-15 DIAGNOSIS — E7800 Pure hypercholesterolemia, unspecified: Secondary | ICD-10-CM | POA: Diagnosis not present

## 2021-03-15 DIAGNOSIS — Z9109 Other allergy status, other than to drugs and biological substances: Secondary | ICD-10-CM

## 2021-03-15 DIAGNOSIS — Z0000 Encounter for general adult medical examination without abnormal findings: Secondary | ICD-10-CM

## 2021-03-15 LAB — HEPATIC FUNCTION PANEL
ALT: 19 U/L (ref 0–35)
AST: 17 U/L (ref 0–37)
Albumin: 4.4 g/dL (ref 3.5–5.2)
Alkaline Phosphatase: 66 U/L (ref 39–117)
Bilirubin, Direct: 0.1 mg/dL (ref 0.0–0.3)
Total Bilirubin: 0.8 mg/dL (ref 0.2–1.2)
Total Protein: 7.3 g/dL (ref 6.0–8.3)

## 2021-03-15 LAB — BASIC METABOLIC PANEL
BUN: 15 mg/dL (ref 6–23)
CO2: 27 mEq/L (ref 19–32)
Calcium: 9.7 mg/dL (ref 8.4–10.5)
Chloride: 101 mEq/L (ref 96–112)
Creatinine, Ser: 0.97 mg/dL (ref 0.40–1.20)
GFR: 59.98 mL/min — ABNORMAL LOW (ref >60.00)
Glucose, Bld: 98 mg/dL (ref 70–99)
Potassium: 4.1 mEq/L (ref 3.5–5.1)
Sodium: 138 mEq/L (ref 135–145)

## 2021-03-15 LAB — LIPID PANEL
Cholesterol: 169 mg/dL (ref 0–200)
HDL: 61.5 mg/dL (ref >39.00)
LDL Cholesterol: 73 mg/dL (ref 0–99)
NonHDL: 107.03
Total CHOL/HDL Ratio: 3
Triglycerides: 171 mg/dL — ABNORMAL HIGH (ref 0.0–149.0)
VLDL: 34.2 mg/dL (ref 0.0–40.0)

## 2021-03-15 NOTE — Assessment & Plan Note (Signed)
Physical today 03/15/21.  Mammogram 11/10/20 - Birads I.  Colonoscopy 02/22/19 - 2 mm polyp (cecum).

## 2021-03-15 NOTE — Progress Notes (Signed)
Patient ID: Jill Torres, female   DOB: 1951-03-28, 70 y.o.   MRN: [id number]   Subjective:    Patient ID: Jill Torres, female    DOB: October 25, 1951, 70 y.o.   MRN: [id number]  HPI This visit occurred during the SARS-CoV-2 public health emergency.  Safety protocols were in place, including screening questions prior to the visit, additional usage of staff PPE, and extensive cleaning of exam room while observing appropriate contact time as indicated for disinfecting solutions.  Patient here for her physical exam.  She reports she is doing well.  Feels good.  Stays active.  No chest pain or sob.  Noticed previously some acid reflux.  She cut back her coffee intake.  Decreased tea intake and decreased chocolate.  No acid reflux now.  No abdominal pain.  Bowels - regular.  Is having some allergy symptoms.  Taking mucinex, saline and flonase.  Controlling.  No fever.  No chest congestion or cough.     Past Medical History:  Diagnosis Date  . Allergy   . Arthritis   . Depression   . Diverticulosis of colon (without mention of hemorrhage)   . GERD (gastroesophageal reflux disease)   . History of chicken pox   . History of shingles may 2013  . HLD (hyperlipidemia)   . HTN (hypertension)   . Irritable bowel syndrome   . Unspecified hypothyroidism    Past Surgical History:  Procedure Laterality Date  . ABDOMINAL HYSTERECTOMY  1983   due to heavy bleeding & ovary cyst  . APPENDECTOMY  1974  . BLADDER DIVERTICULECTOMY  1990  . CESAREAN SECTION     x 2  . CHOLECYSTECTOMY    . DILATION AND CURETTAGE OF UTERUS     x 2  . DIRECT LARYNGOSCOPY Right 06/11/2015   Procedure: suspension microdirect laryngoscopy with biopsy of right tongue base;  Surgeon: Carloyn Manner, MD;  Location: [facility] ORS;  Service: ENT;  Laterality: Right;  . NASAL SEPTUM SURGERY  1999  . PARTIAL HYSTERECTOMY  1983   cyst on ovary and heavy bleeding    Family History  Problem Relation Age of Onset  . Arthritis Mother    . Hypertension Mother   . Hyperlipidemia Mother   . Atrial fibrillation Mother   . Heart disease Mother   . Diabetes Mother   . Colon polyps Mother   . Hyperlipidemia Father   . Heart disease Father        s/p CABG  . Stroke Father   . Rheumatic fever Sister        s/p valve replacement  . Deep vein thrombosis Brother        after immobilization   . Colon polyps Brother   . Heart disease Brother   . Diabetes Maternal Grandfather   . Heart disease Brother   . Diabetes Maternal Aunt   . Diabetes Maternal Uncle   . Breast cancer Neg Hx   . Colon cancer Neg Hx   . Esophageal cancer Neg Hx   . Rectal cancer Neg Hx   . Stomach cancer Neg Hx    Social History   Socioeconomic History  . Marital status: Single    Spouse name: Not on file  . Number of children: 2  . Years of education: Not on file  . Highest education level: Not on file  Occupational History  . Occupation: retired    Comment: Chief of Staff   Tobacco Use  . Smoking status: Never Smoker  .  Smokeless tobacco: Never Used  Substance and Sexual Activity  . Alcohol use: No    Alcohol/week: 0.0 standard drinks  . Drug use: No  . Sexual activity: Not on file  Other Topics Concern  . Not on file  Social History Narrative   No regular exercise   Lives in noisy environment- very stressful    Daily caffeine use    Social Determinants of Health   Financial Resource Strain: Low Risk   . Difficulty of Paying Living Expenses: Not hard at all  Food Insecurity: No Food Insecurity  . Worried About Charity fundraiser in the Last Year: Never true  . Ran Out of Food in the Last Year: Never true  Transportation Needs: No Transportation Needs  . Lack of Transportation (Medical): No  . Lack of Transportation (Non-Medical): No  Physical Activity: Insufficiently Active  . Days of Exercise per Week: 4 days  . Minutes of Exercise per Session: 20 min  Stress: No Stress Concern Present  . Feeling of Stress : Not  at all  Social Connections: Unknown  . Frequency of Communication with Friends and Family: More than three times a week  . Frequency of Social Gatherings with Friends and Family: More than three times a week  . Attends Religious Services: 1 to 4 times per year  . Active Member of Clubs or Organizations: Yes  . Attends Archivist Meetings: Not on file  . Marital Status: Not on file    Outpatient Encounter Medications as of 03/15/2021  Medication Sig  . amLODipine (NORVASC) 2.5 MG tablet TAKE 1 TABLET BY MOUTH EVERY DAY  . Ascorbic Acid (VITAMIN C) 1000 MG tablet Take 1,000 mg by mouth daily.  . bifidobacterium infantis (ALIGN) capsule Take 1 capsule by mouth daily.  . calcium-vitamin D (OSCAL WITH D 500-200) 500-200 MG-UNIT per tablet Take 1 tablet by mouth daily.  . Coenzyme Q10 (COQ10 PO) Take by mouth daily.  . fish oil-omega-3 fatty acids 1000 MG capsule Take 2 g by mouth daily.  . Fluticasone Furoate (FLONASE SENSIMIST NA) Place into the nose.  . multivitamin (THERAGRAN) per tablet Take 1 tablet by mouth daily.  Marland Kitchen neomycin-polymyxin-hydrocortisone (CORTISPORIN) OTIC solution Place 3 drops into the left ear 3 (three) times daily as needed.  . polyethylene glycol powder (GLYCOLAX/MIRALAX) 17 GM/SCOOP powder Take 17 g by mouth as needed.  . rosuvastatin (CRESTOR) 20 MG tablet TAKE 1 TABLET BY MOUTH EVERY DAY  . zinc gluconate 50 MG tablet Take 50 mg by mouth daily.   No facility-administered encounter medications on file as of 03/15/2021.    Review of Systems  Constitutional: Negative for appetite change and unexpected weight change.  HENT: Negative for congestion and sinus pressure.   Respiratory: Negative for cough, chest tightness and shortness of breath.   Cardiovascular: Negative for chest pain, palpitations and leg swelling.  Gastrointestinal: Negative for abdominal pain, diarrhea, nausea and vomiting.  Genitourinary: Negative for difficulty urinating and dysuria.   Musculoskeletal: Negative for joint swelling and myalgias.  Skin: Negative for color change and rash.  Neurological: Negative for dizziness, light-headedness and headaches.  Psychiatric/Behavioral: Negative for agitation and dysphoric mood.       Objective:    Physical Exam Vitals reviewed.  Constitutional:      General: She is not in acute distress.    Appearance: Normal appearance.  HENT:     Head: Normocephalic and atraumatic.     Right Ear: External ear normal.  Left Ear: External ear normal.     Nose: Nose normal.  Eyes:     General: No scleral icterus.       Right eye: No discharge.        Left eye: No discharge.     Conjunctiva/sclera: Conjunctivae normal.  Neck:     Thyroid: No thyromegaly.  Cardiovascular:     Rate and Rhythm: Normal rate and regular rhythm.  Pulmonary:     Effort: No respiratory distress.     Breath sounds: Normal breath sounds. No wheezing.  Abdominal:     General: Bowel sounds are normal.     Palpations: Abdomen is soft.     Tenderness: There is no abdominal tenderness.  Musculoskeletal:        General: No tenderness.     Cervical back: Neck supple.  Lymphadenopathy:     Cervical: No cervical adenopathy.  Neurological:     Mental Status: She is alert.  Psychiatric:        Mood and Affect: Mood normal.        Behavior: Behavior normal.     BP 136/70   Pulse 88   Temp 98 F (36.7 C) (Oral)   Resp 16   Ht 5\' 3"  (1.6 m)   Wt 183 lb 3.2 oz (83.1 kg)   LMP 12/12/1981   SpO2 98%   BMI 32.45 kg/m  Wt Readings from Last 3 Encounters:  03/15/21 183 lb 3.2 oz (83.1 kg)  02/09/21 182 lb (82.6 kg)  11/11/20 182 lb (82.6 kg)     Lab Results  Component Value Date   WBC 8.8 07/08/2020   HGB 13.5 07/08/2020   HCT 40.3 07/08/2020   PLT 370.0 07/08/2020   GLUCOSE 98 03/15/2021   CHOL 169 03/15/2021   TRIG 171.0 (H) 03/15/2021   HDL 61.50 03/15/2021   LDLDIRECT 155.8 06/27/2013   LDLCALC 73 03/15/2021   ALT 19 03/15/2021    AST 17 03/15/2021   NA 138 03/15/2021   K 4.1 03/15/2021   CL 101 03/15/2021   CREATININE 0.97 03/15/2021   BUN 15 03/15/2021   CO2 27 03/15/2021   TSH 1.08 07/08/2020    MM 3D SCREEN BREAST BILATERAL  Result Date: 11/10/2020 CLINICAL DATA:  Screening. EXAM: DIGITAL SCREENING BILATERAL MAMMOGRAM WITH TOMO AND CAD COMPARISON:  Previous exam(s). ACR Breast Density Category c: The breast tissue is heterogeneously dense, which may obscure small masses. FINDINGS: There are no findings suspicious for malignancy. Images were processed with CAD. IMPRESSION: No mammographic evidence of malignancy. A result letter of this screening mammogram will be mailed directly to the patient. RECOMMENDATION: Screening mammogram in one year. (Code:SM-B-01Y) BI-RADS CATEGORY  1: Negative. Electronically Signed   By: Kristopher Oppenheim M.D.   On: 11/10/2020 14:16       Assessment & Plan:   Problem List Items Addressed This Visit    Environmental allergies    Continue antihistamine and nasal spray.  Follow.       GERD (gastroesophageal reflux disease)    Adjusted coffee intake and decreased tea and chocolate intake.  No acid reflux.  Follow.       Health care maintenance    Physical today 03/15/21.  Mammogram 11/10/20 - Birads I.  Colonoscopy 02/22/19 - 2 mm polyp (cecum).       HTN (hypertension)    Continue amlodipine.  Follow pressures.  Follow metabolic panel.       Relevant Orders   Basic metabolic panel (Completed)  Hypercholesterolemia - Primary    Continue pravastatin.  Ow cholesterol diet and exercise.  Follow lipid panel and liver function tests.       Relevant Orders   Hepatic function panel (Completed)   Lipid panel (Completed)   Hypothyroidism    On thyroid replacement.  Follow tsh.           [name], MD

## 2021-03-20 ENCOUNTER — Encounter: Payer: Self-pay | Admitting: Internal Medicine

## 2021-03-20 NOTE — Assessment & Plan Note (Signed)
Continue pravastatin.  Ow cholesterol diet and exercise.  Follow lipid panel and liver function tests.

## 2021-03-20 NOTE — Assessment & Plan Note (Signed)
Adjusted coffee intake and decreased tea and chocolate intake.  No acid reflux.  Follow.

## 2021-03-20 NOTE — Assessment & Plan Note (Signed)
Continue amlodipine.  Follow pressures.  Follow metabolic panel.   

## 2021-03-20 NOTE — Assessment & Plan Note (Signed)
Continue antihistamine and nasal spray.  Follow.

## 2021-03-20 NOTE — Assessment & Plan Note (Signed)
On thyroid replacement.  Follow tsh.  

## 2021-05-10 ENCOUNTER — Other Ambulatory Visit: Payer: Self-pay | Admitting: Internal Medicine

## 2021-05-10 DIAGNOSIS — R030 Elevated blood-pressure reading, without diagnosis of hypertension: Secondary | ICD-10-CM

## 2021-07-15 ENCOUNTER — Ambulatory Visit (INDEPENDENT_AMBULATORY_CARE_PROVIDER_SITE_OTHER): Payer: Medicare HMO | Admitting: Internal Medicine

## 2021-07-15 ENCOUNTER — Other Ambulatory Visit: Payer: Self-pay

## 2021-07-15 ENCOUNTER — Encounter: Payer: Self-pay | Admitting: Internal Medicine

## 2021-07-15 VITALS — BP 128/78 | HR 69 | Ht 62.99 in | Wt 178.2 lb

## 2021-07-15 DIAGNOSIS — E7800 Pure hypercholesterolemia, unspecified: Secondary | ICD-10-CM

## 2021-07-15 DIAGNOSIS — K219 Gastro-esophageal reflux disease without esophagitis: Secondary | ICD-10-CM | POA: Diagnosis not present

## 2021-07-15 DIAGNOSIS — H60503 Unspecified acute noninfective otitis externa, bilateral: Secondary | ICD-10-CM | POA: Diagnosis not present

## 2021-07-15 DIAGNOSIS — Z9109 Other allergy status, other than to drugs and biological substances: Secondary | ICD-10-CM | POA: Diagnosis not present

## 2021-07-15 DIAGNOSIS — I10 Essential (primary) hypertension: Secondary | ICD-10-CM | POA: Diagnosis not present

## 2021-07-15 DIAGNOSIS — E039 Hypothyroidism, unspecified: Secondary | ICD-10-CM | POA: Diagnosis not present

## 2021-07-15 LAB — CBC WITH DIFFERENTIAL/PLATELET
Basophils Absolute: 0.1 10*3/uL (ref 0.0–0.1)
Basophils Relative: 1.2 % (ref 0.0–3.0)
Eosinophils Absolute: 0.1 10*3/uL (ref 0.0–0.7)
Eosinophils Relative: 1.2 % (ref 0.0–5.0)
HCT: 39.4 % (ref 36.0–46.0)
Hemoglobin: 13.2 g/dL (ref 12.0–15.0)
Lymphocytes Relative: 34.7 % (ref 12.0–46.0)
Lymphs Abs: 2.6 10*3/uL (ref 0.7–4.0)
MCHC: 33.5 g/dL (ref 30.0–36.0)
MCV: 90.1 fl (ref 78.0–100.0)
Monocytes Absolute: 0.5 10*3/uL (ref 0.1–1.0)
Monocytes Relative: 6.7 % (ref 3.0–12.0)
Neutro Abs: 4.2 10*3/uL (ref 1.4–7.7)
Neutrophils Relative %: 56.2 % (ref 43.0–77.0)
Platelets: 331 10*3/uL (ref 150.0–400.0)
RBC: 4.37 Mil/uL (ref 3.87–5.11)
RDW: 12.5 % (ref 11.5–15.5)
WBC: 7.5 10*3/uL (ref 4.0–10.5)

## 2021-07-15 LAB — HEPATIC FUNCTION PANEL
ALT: 11 U/L (ref 0–35)
AST: 12 U/L (ref 0–37)
Albumin: 4.3 g/dL (ref 3.5–5.2)
Alkaline Phosphatase: 61 U/L (ref 39–117)
Bilirubin, Direct: 0.1 mg/dL (ref 0.0–0.3)
Total Bilirubin: 0.6 mg/dL (ref 0.2–1.2)
Total Protein: 7.1 g/dL (ref 6.0–8.3)

## 2021-07-15 LAB — BASIC METABOLIC PANEL
BUN: 9 mg/dL (ref 6–23)
CO2: 25 mEq/L (ref 19–32)
Calcium: 9.6 mg/dL (ref 8.4–10.5)
Chloride: 104 mEq/L (ref 96–112)
Creatinine, Ser: 0.95 mg/dL (ref 0.40–1.20)
GFR: 61.36 mL/min (ref >60.00)
Glucose, Bld: 89 mg/dL (ref 70–99)
Potassium: 4.2 mEq/L (ref 3.5–5.1)
Sodium: 140 mEq/L (ref 135–145)

## 2021-07-15 LAB — LIPID PANEL
Cholesterol: 157 mg/dL (ref 0–200)
HDL: 56.3 mg/dL (ref >39.00)
LDL Cholesterol: 61 mg/dL (ref 0–99)
NonHDL: 100.45
Total CHOL/HDL Ratio: 3
Triglycerides: 199 mg/dL — ABNORMAL HIGH (ref 0.0–149.0)
VLDL: 39.8 mg/dL (ref 0.0–40.0)

## 2021-07-15 LAB — TSH: TSH: 1.26 u[IU]/mL (ref 0.35–5.50)

## 2021-07-15 MED ORDER — NEOMYCIN-POLYMYXIN-HC 3.5-10000-1 OT SOLN: 3.0000 [drp] | Freq: Three times a day (TID) | OTIC | 0 refills | Status: DC | PRN

## 2021-07-15 NOTE — Assessment & Plan Note (Signed)
On thyroid replacement.  Follow tsh.  

## 2021-07-15 NOTE — Assessment & Plan Note (Signed)
Continue pravastatin.  Ow cholesterol diet and exercise.  Follow lipid panel and liver function tests.

## 2021-07-15 NOTE — Assessment & Plan Note (Signed)
Continue zyrtec and flonase.  Also using saline nasal spray.  Continue.  Follow.

## 2021-07-15 NOTE — Assessment & Plan Note (Signed)
No acid reflux now.  Doing well.  Follow.

## 2021-07-15 NOTE — Progress Notes (Signed)
Patient ID: Jill Torres, female   DOB: May 17, 1951, 70 y.o.   MRN: [id number]   Subjective:    Patient ID: Jill Torres, female    DOB: [birth year], 70 y.o.   MRN: [id number]  HPI This visit occurred during the SARS-CoV-2 public health emergency.  Safety protocols were in place, including screening questions prior to the visit, additional usage of staff PPE, and extensive cleaning of exam room while observing appropriate contact time as indicated for disinfecting solutions.   Patient here for a scheduled follow up.  Here to follow up regarding her cholesterol and blood pressure.  She is doing well.  Some increased stress recently.  Her son fracture foot/ankle.  She is handling things well.  Stays active.  Working on weekends at [employer].  No chest pain reported.  Breathing stable.  No increased cough or congestion.  No increased abdominal pain reported.  No bowel issues reported.  No sick contacts.  No fever.  No nausea or vomiting. No acid reflux.  Overall feels good. She does report having some allergy symptoms.  Some minimal drainage.  She has noticed her ears are draining, some irritation inside of her ears.  She has had to use Cortisporin otic before and this is worked well.  No change in hearing.   Past Medical History:  Diagnosis Date   Allergy    Arthritis    Depression    Diverticulosis of colon (without mention of hemorrhage)    GERD (gastroesophageal reflux disease)    History of chicken pox    History of shingles may 2013   HLD (hyperlipidemia)    HTN (hypertension)    Irritable bowel syndrome    Unspecified hypothyroidism    Past Surgical History:  Procedure Laterality Date   ABDOMINAL HYSTERECTOMY  1983   due to heavy bleeding & ovary cyst   APPENDECTOMY  1974   BLADDER DIVERTICULECTOMY  1990   CESAREAN SECTION     x 2   CHOLECYSTECTOMY     DILATION AND CURETTAGE OF UTERUS     x 2   DIRECT LARYNGOSCOPY Right 06/11/2015   Procedure: suspension microdirect  laryngoscopy with biopsy of right tongue base;  Surgeon: Carloyn Manner, MD;  Location: [facility] ORS;  Service: ENT;  Laterality: Right;   [place]   cyst on ovary and heavy bleeding    Family History  Problem Relation Age of Onset   Arthritis Mother    Hypertension Mother    Hyperlipidemia Mother    Atrial fibrillation Mother    Heart disease Mother    Diabetes Mother    Colon polyps Mother    Hyperlipidemia Father    Heart disease Father        s/p CABG   Stroke Father    Rheumatic fever Sister        s/p valve replacement   Deep vein thrombosis Brother        after immobilization    Colon polyps Brother    Heart disease Brother    Diabetes Maternal Grandfather    Heart disease Brother    Diabetes Maternal Aunt    Diabetes Maternal Uncle    Breast cancer Neg Hx    Colon cancer Neg Hx    Esophageal cancer Neg Hx    Rectal cancer Neg Hx    Stomach cancer Neg Hx    Social History   Socioeconomic History   Marital status: Single  Spouse name: Not on file   Number of children: 2   Years of education: Not on file   Highest education level: Not on file  Occupational History   Occupation: retired    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   Tobacco Use   Smoking status: Never   Smokeless tobacco: Never  Substance and Sexual Activity   Alcohol use: No    Alcohol/week: 0.0 standard drinks   Drug use: No   Sexual activity: Not on file  Other Topics Concern   Not on file  Social History Narrative   No regular exercise   Lives in noisy environment- very stressful    Daily caffeine use    Social Determinants of Health   Financial Resource Strain: Low Risk    Difficulty of Paying Living Expenses: Not hard at all  Food Insecurity: No Food Insecurity   Worried About Charity fundraiser in the Last Year: Never true   [place] in the Last Year: Never true  Transportation Needs: No Transportation Needs   Lack of  Transportation (Medical): No   Lack of Transportation (Non-Medical): No  Physical Activity: Insufficiently Active   Days of Exercise per Week: 4 days   Minutes of Exercise per Session: 20 min  Stress: No Stress Concern Present   Feeling of Stress : Not at all  Social Connections: Unknown   Frequency of Communication with Friends and Family: More than three times a week   Frequency of Social Gatherings with Friends and Family: More than three times a week   Attends Religious Services: 1 to 4 times per year   Active Member of [employer] or Organizations: Yes   Attends Archivist Meetings: Not on file   Marital Status: Not on file    Review of Systems  Constitutional:  Negative for appetite change and unexpected weight change.  HENT:  Negative for congestion and sinus pressure.        Ear irritation as outlined.  Some minimal drainage and allergy symptoms.    Respiratory:  Negative for cough, chest tightness and shortness of breath.   Cardiovascular:  Negative for chest pain, palpitations and leg swelling.  Gastrointestinal:  Negative for abdominal pain, diarrhea, nausea and vomiting.  Genitourinary:  Negative for difficulty urinating and dysuria.  Musculoskeletal:  Negative for joint swelling and myalgias.  Skin:  Negative for color change and rash.  Neurological:  Negative for dizziness, light-headedness and headaches.  Psychiatric/Behavioral:  Negative for agitation and dysphoric mood.       Objective:    Physical Exam Vitals reviewed.  Constitutional:      General: She is not in acute distress.    Appearance: Normal appearance.  HENT:     Head: Normocephalic and atraumatic.     Right Ear: Tympanic membrane and external ear normal.     Left Ear: Tympanic membrane and external ear normal.     Ears:     Comments: Scaling and minimal erythema/irritation - ear canal (Bilateral).  Eyes:     General: No scleral icterus.       Right eye: No discharge.        Left eye: No  discharge.     Conjunctiva/sclera: Conjunctivae normal.  Neck:     Thyroid: No thyromegaly.  Cardiovascular:     Rate and Rhythm: Normal rate and regular rhythm.  Pulmonary:     Effort: No respiratory distress.     Breath sounds: Normal breath sounds. No wheezing.  Abdominal:     General: Bowel sounds are normal.     Palpations: Abdomen is soft.     Tenderness: There is no abdominal tenderness.  Musculoskeletal:        General: No swelling or tenderness.     Cervical back: Neck supple. No tenderness.  Lymphadenopathy:     Cervical: No cervical adenopathy.  Skin:    Findings: No erythema or rash.  Neurological:     Mental Status: She is alert.  Psychiatric:        Mood and Affect: Mood normal.        Behavior: Behavior normal.    BP 138/80   Pulse 69   Ht 5' 2.99" (1.6 m)   Wt 178 lb 3.2 oz (80.8 kg)   LMP 12/12/1981   SpO2 98%   BMI 31.57 kg/m  Wt Readings from Last 3 Encounters:  07/15/21 178 lb 3.2 oz (80.8 kg)  03/15/21 183 lb 3.2 oz (83.1 kg)  02/09/21 182 lb (82.6 kg)    Outpatient Encounter Medications as of 07/15/2021  Medication Sig   amLODipine (NORVASC) 2.5 MG tablet TAKE 1 TABLET BY MOUTH EVERY DAY   Ascorbic Acid (VITAMIN C) 1000 MG tablet Take 1,000 mg by mouth daily.   bifidobacterium infantis (ALIGN) capsule Take 1 capsule by mouth daily.   calcium-vitamin D (OSCAL WITH D 500-200) 500-200 MG-UNIT per tablet Take 1 tablet by mouth daily.   Coenzyme Q10 (COQ10 PO) Take by mouth daily.   fish oil-omega-3 fatty acids 1000 MG capsule Take 2 g by mouth daily.   Fluticasone Furoate (FLONASE SENSIMIST NA) Place into the nose.   multivitamin (THERAGRAN) per tablet Take 1 tablet by mouth daily.   neomycin-polymyxin-hydrocortisone (CORTISPORIN) OTIC solution Place 3 drops into the left ear 3 (three) times daily as needed.   polyethylene glycol powder (GLYCOLAX/MIRALAX) 17 GM/SCOOP powder Take 17 g by mouth as needed.   rosuvastatin (CRESTOR) 20 MG tablet TAKE 1  TABLET BY MOUTH EVERY DAY   zinc gluconate 50 MG tablet Take 50 mg by mouth daily.   No facility-administered encounter medications on file as of 07/15/2021.     Lab Results  Component Value Date   WBC 8.8 07/08/2020   HGB 13.5 07/08/2020   HCT 40.3 07/08/2020   PLT 370.0 07/08/2020   GLUCOSE 98 03/15/2021   CHOL 169 03/15/2021   TRIG 171.0 (H) 03/15/2021   HDL 61.50 03/15/2021   LDLDIRECT 155.8 06/27/2013   LDLCALC 73 03/15/2021   ALT 19 03/15/2021   AST 17 03/15/2021   NA 138 03/15/2021   K 4.1 03/15/2021   CL 101 03/15/2021   CREATININE 0.97 03/15/2021   BUN 15 03/15/2021   CO2 27 03/15/2021   TSH 1.08 07/08/2020    MM 3D SCREEN BREAST BILATERAL  Result Date: 11/10/2020 CLINICAL DATA:  Screening. EXAM: DIGITAL SCREENING BILATERAL MAMMOGRAM WITH TOMO AND CAD COMPARISON:  Previous exam(s). ACR Breast Density Category c: The breast tissue is heterogeneously dense, which may obscure small masses. FINDINGS: There are no findings suspicious for malignancy. Images were processed with CAD. IMPRESSION: No mammographic evidence of malignancy. A result letter of this screening mammogram will be mailed directly to the patient. RECOMMENDATION: Screening mammogram in one year. (Code:SM-B-01Y) BI-RADS CATEGORY  1: Negative. Electronically Signed   By: Kristopher Oppenheim M.D.   On: 11/10/2020 14:16       Assessment & Plan:   Problem List Items Addressed This Visit     HTN (hypertension)  Relevant Orders   CBC with Differential/Platelet   Basic metabolic panel   Hypercholesterolemia - Primary   Relevant Orders   Lipid panel   TSH   Hepatic function panel     [name], MD

## 2021-07-15 NOTE — Assessment & Plan Note (Signed)
Cortisporin otic as directed.  Follow.

## 2021-07-15 NOTE — Assessment & Plan Note (Signed)
Continue amlodipine.  Follow pressures.  Follow metabolic panel.   

## 2021-07-25 ENCOUNTER — Encounter: Payer: Self-pay | Admitting: Internal Medicine

## 2021-07-26 ENCOUNTER — Encounter: Payer: Self-pay | Admitting: Family

## 2021-07-26 ENCOUNTER — Telehealth (INDEPENDENT_AMBULATORY_CARE_PROVIDER_SITE_OTHER): Payer: Medicare HMO | Admitting: Family

## 2021-07-26 VITALS — Ht 62.99 in | Wt 178.0 lb

## 2021-07-26 DIAGNOSIS — U071 COVID-19: Secondary | ICD-10-CM | POA: Diagnosis not present

## 2021-07-26 MED ORDER — MOLNUPIRAVIR EUA 200MG CAPSULE: 4.0000 | ORAL_CAPSULE | Freq: Two times a day (BID) | ORAL | 0 refills | Status: AC

## 2021-07-26 NOTE — Progress Notes (Signed)
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  She tested positive yesterday.  Complains of nonbloody diarrhea which is dark in color to yellow x one day.  She also endorses nasal congestion and scratchy throat. No fever, sob, chills.   Taking imodium x 2 days. Stool is loose brown. Diarrhea is similar to prior episodes of IBS  H/o HLD, HTN, IBS, seasonal allergies.   She has had pfizer 2/2. No booster.   No h/o CKD.   Never smoker.      A/P/next steps:  Problem List Items Addressed This Visit       Other   COVID-19 - Primary    COVID-positive.  Afebrile.  No acute respiratory distress.  We discussed treating with antiviral.  We discussed both paxlvoid and molnupiravir.  As patient is currently on statin, calcium channel blocker, and there is an interaction with paxlovid, we opted to treat with molnupiravir.   I advised to hold Imodium in the setting of a viral gastroenteritis.  Discussed molnupiravir.I have counseled on lacking long term safely and effectiveness data of medication,molnupiravir.  Explained EUA for molnupiravir. Criteria met for consideration of  Molnupiravir :  covid positive, patient older than 69 years old, started within 5 days of symptom onset and risk factor for severe disease include: HTN, HLD  Counseled on adverse effects including nausea, dizziness, diarrhea.  Patient is most comfortable and desires to start [place] .        Relevant Medications   molnupiravir EUA 200 mg CAPS     I spent 11 min  discussing plan of care over the phone.

## 2021-07-26 NOTE — Telephone Encounter (Signed)
Patient scheduled with Joycelyn Schmid today

## 2021-07-26 NOTE — Assessment & Plan Note (Signed)
COVID-positive.  Afebrile.  No acute respiratory distress.  We discussed treating with antiviral.  We discussed both paxlvoid and molnupiravir.  As patient is currently on statin, calcium channel blocker, and there is an interaction with paxlovid, we opted to treat with molnupiravir.   I advised to hold Imodium in the setting of a viral gastroenteritis.  Discussed molnupiravir.I have counseled on lacking long term safely and effectiveness data of medication,molnupiravir.  Explained EUA for molnupiravir. Criteria met for consideration of  Molnupiravir :  covid positive, patient older than 69 years old, started within 5 days of symptom onset and risk factor for severe disease include: HTN, HLD  Counseled on adverse effects including nausea, dizziness, diarrhea.  Patient is most comfortable and desires to start [place] .

## 2021-07-26 NOTE — Telephone Encounter (Signed)
Please advise 

## 2021-07-26 NOTE — Patient Instructions (Addendum)
It was very nice to speak with you today.  As discussed, please stop the Imodium in the setting of a viral illness.  We discussed starting Molnupiravir which is an unapproved drug that is authorized for use under an Emergency Use Authorization.  There are no adequate, approved, available products for the treatment of COVID-19 in adults who have mild-to-moderate COVID-19 and are at high risk for progressing to severe COVID-19, including hospitalization or death.  I have sent  Molnupiravir to your pharmacy. Please call pharmacy so they bring medication out to your car and you do not have to go inside.   As the medication can cause diarrhea and you are currently experiencing diarrhea, if diarrhea is at all worse, you may stop medication.  Please all hesitate to call us with any questions or concerns.  Please remain in quarantine for 10 days.   This medication is not recommended in pregnancy.    COMMON SIDE EFFECTS: Diarrhea Nausea dizziness   If your COVID-19 symptoms get worse, get medical help right away. Call 911 if you experience symptoms such as worsening cough, trouble breathing, chest pain that doesn't go away, confusion, a hard time staying awake, and pale or blue-colored skin.This medication won't prevent all COVID-19 cases from getting worse.   Molnupiravir Oral Capsules What is this medication? MOLNUPIRAVIR (mol nue pir a vir) treats COVID-19. It is an antiviral medication. It may decrease the risk of developing severe symptoms of COVID-19. It may also decrease the chance of going to the hospital. This medication is not approved by the FDA. The FDA has authorized emergency use of thismedication during the COVID-19 pandemic. This medicine may be used for other purposes; ask your health care provider orpharmacist if you have questions. What should I tell my care team before I take this medication? They need to know if you have any of these conditions: Any allergies Any serious  illness An unusual or allergic reaction to molnupiravir, other medications, foods, dyes, or preservatives Pregnant or trying to get pregnant Breast-feeding How should I use this medication? Take this medication by mouth with water. Take it as directed on the prescription label at the same time every day. Do not cut, crush or chew this medication. Swallow the capsules whole. You can take it with or without food. If it upsets your stomach, take it with food. Take all of this medication unless your care team tells you to stop it early. Keep taking it even if youthink you are better. Talk to your care team about the use of this medication in children. Specialcare may be needed. Overdosage: If you think you have taken too much of this medicine contact apoison control center or emergency room at once. NOTE: This medicine is only for you. Do not share this medicine with others. What if I miss a dose? If you miss a dose, take it as soon as you can unless it is more than 10 hours late. If it is more than 10 hours late, skip the missed dose. Take the next dose at the normal time. Do not take extra or 2 doses at the same time to makeup for the missed dose. What may interact with this medication? Interactions have not been studied. This list may not describe all possible interactions. Give your health care provider a list of all the medicines, herbs, non-prescription drugs, or dietary supplements you use. Also tell them if you smoke, drink alcohol, or use illegaldrugs. Some items may interact with your medicine. What  should I watch for while using this medication? Your condition will be monitored carefully while you are receiving this medication. Visit your care team for regular checkups. Tell your care team ifyour symptoms do not start to get better or if they get worse. Do not become pregnant while taking this medication. You may need a pregnancy test before starting this medication. Women must use a reliable  form of birth control while taking this medication and for 4 days after stopping the medication. Women should inform their care team if they wish to become pregnant or think they might be pregnant. Men should not father a child while taking this medication and for 3 months after stopping it. There is potential for serious harm to an unborn child. Talk to your care team for more information. Do not breast-feed an infant while taking this medication and for 4 days afterstopping the medication. What side effects may I notice from receiving this medication? Side effects that you should report to your care team as soon as possible: Allergic reactions-skin rash, itching, hives, swelling of the face, lips, tongue, or throat Side effects that usually do not require medical attention (report these toyour care team if they continue or are bothersome): Diarrhea Dizziness Nausea This list may not describe all possible side effects. Call your doctor for medical advice about side effects. You may report side effects to FDA at1-800-FDA-1088. Where should I keep my medication? Keep out of the reach of children and pets. Store at room temperature between 20 and 25 degrees C (68 and 77 degrees F).Get rid of any unused medication after the expiration date. To get rid of medications that are no longer needed or have expired: Take the medication to a medication take-back program. Check with your pharmacy or law enforcement to find a location. If you cannot return the medication, check the label or package insert to see if the medication should be thrown out in the garbage or flushed down the toilet. If you are not sure, ask your care team. If it is safe to put it in the trash, take the medication out of the container. Mix the medication with cat litter, dirt, coffee grounds, or other unwanted substance. Seal the mixture in a bag or container. Put it in the trash. NOTE: This sheet is a summary. It may not cover all  possible information. If you have questions about this medicine, talk to your doctor, pharmacist, orhealth care provider.  2022 Elsevier/Gold Standard (2020-12-07 16:16:01)

## 2021-09-13 ENCOUNTER — Telehealth: Payer: Self-pay | Admitting: Internal Medicine

## 2021-09-13 ENCOUNTER — Other Ambulatory Visit: Payer: Self-pay | Admitting: Internal Medicine

## 2021-09-13 DIAGNOSIS — Z1231 Encounter for screening mammogram for malignant neoplasm of breast: Secondary | ICD-10-CM

## 2021-09-13 NOTE — Telephone Encounter (Signed)
Patient is calling in to see if she can come in to have a urinalysis done for pressure in her stomach, trouble urinating and lower back pain.Offered to transfer patient to Access Nurse and she stated she was seen for these symptoms in December but is still having problems.Please advise.

## 2021-09-14 ENCOUNTER — Encounter: Payer: Self-pay | Admitting: Family

## 2021-09-14 ENCOUNTER — Other Ambulatory Visit: Payer: Self-pay

## 2021-09-14 ENCOUNTER — Ambulatory Visit (INDEPENDENT_AMBULATORY_CARE_PROVIDER_SITE_OTHER): Payer: Medicare HMO | Admitting: Family

## 2021-09-14 VITALS — BP 142/86 | HR 76 | Temp 96.1°F | Ht 62.99 in | Wt 170.0 lb

## 2021-09-14 DIAGNOSIS — R300 Dysuria: Secondary | ICD-10-CM | POA: Diagnosis not present

## 2021-09-14 DIAGNOSIS — N390 Urinary tract infection, site not specified: Secondary | ICD-10-CM | POA: Diagnosis not present

## 2021-09-14 DIAGNOSIS — A499 Bacterial infection, unspecified: Secondary | ICD-10-CM | POA: Diagnosis not present

## 2021-09-14 LAB — POCT URINALYSIS DIPSTICK
Bilirubin, UA: NEGATIVE
Glucose, UA: NEGATIVE
Ketones, UA: NEGATIVE
Nitrite, UA: NEGATIVE
Protein, UA: NEGATIVE
Spec Grav, UA: 1.01 (ref 1.010–1.025)
Urobilinogen, UA: 0.2 E.U./dL
pH, UA: 6.5 (ref 5.0–8.0)

## 2021-09-14 MED ORDER — SULFAMETHOXAZOLE-TRIMETHOPRIM 800-160 MG PO TABS: 1.0000 | ORAL_TABLET | Freq: Two times a day (BID) | ORAL | 0 refills | Status: DC

## 2021-09-14 NOTE — Addendum Note (Signed)
Addended by: Baron Hamper on: 09/14/2021 02:31 PM   Modules accepted: Orders

## 2021-09-14 NOTE — Progress Notes (Signed)
Acute Office Visit  Subjective:    Patient ID: Jill Torres, female    DOB: 31-Jan-1951, 70 y.o.   MRN: [id number]  Chief Complaint  Patient presents with   Urinary symptoms    Pt is having pressure in her abdomen and vaginal area, burning when urinating and changes to the color in her urine.    HPI Patient is in today with c/o urinary frequency, urgency, burning, pelvic pain and low back pain x 3 days and worsening. Denies any fever or chills. Has a history of UTIs in the past. Has not taken any medication  Past Medical History:  Diagnosis Date   Allergy    Arthritis    Depression    Diverticulosis of colon (without mention of hemorrhage)    GERD (gastroesophageal reflux disease)    History of chicken pox    History of shingles may 2013   HLD (hyperlipidemia)    HTN (hypertension)    Irritable bowel syndrome    Unspecified hypothyroidism     Past Surgical History:  Procedure Laterality Date   ABDOMINAL HYSTERECTOMY  1983   due to heavy bleeding & ovary cyst   APPENDECTOMY  1974   BLADDER DIVERTICULECTOMY  1990   CESAREAN SECTION     x 2   CHOLECYSTECTOMY     DILATION AND CURETTAGE OF UTERUS     x 2   DIRECT LARYNGOSCOPY Right 06/11/2015   Procedure: suspension microdirect laryngoscopy with biopsy of right tongue base;  Surgeon: Carloyn Manner, MD;  Location: [facility];  Service: ENT;  Laterality: Right;   [place]   cyst on ovary and heavy bleeding     Family History  Problem Relation Age of Onset   Arthritis Mother    Hypertension Mother    Hyperlipidemia Mother    Atrial fibrillation Mother    Heart disease Mother    Diabetes Mother    Colon polyps Mother    Hyperlipidemia Father    Heart disease Father        s/p CABG   Stroke Father    Rheumatic fever Sister        s/p valve replacement   Deep vein thrombosis Brother        after immobilization    Colon polyps Brother    Heart disease Brother     Diabetes Maternal Grandfather    Heart disease Brother    Diabetes Maternal Aunt    Diabetes Maternal Uncle    Breast cancer Neg Hx    Colon cancer Neg Hx    Esophageal cancer Neg Hx    Rectal cancer Neg Hx    Stomach cancer Neg Hx     Social History   Socioeconomic History   Marital status: Single    Spouse name: Not on file   Number of children: 2   Years of education: Not on file   Highest education level: Not on file  Occupational History   Occupation: retired    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   Tobacco Use   Smoking status: Never   Smokeless tobacco: Never  Substance and Sexual Activity   Alcohol use: No    Alcohol/week: 0.0 standard drinks   Drug use: No   Sexual activity: Not on file  Other Topics Concern   Not on file  Social History Narrative   No regular exercise   Lives in noisy environment- very stressful  Daily caffeine use    Social Determinants of Health   Financial Resource Strain: Low Risk    Difficulty of Paying Living Expenses: Not hard at all  Food Insecurity: No Food Insecurity   Worried About Charity fundraiser in the Last Year: Never true   Arboriculturist in the Last Year: Never true  Transportation Needs: No Transportation Needs   Lack of Transportation (Medical): No   Lack of Transportation (Non-Medical): No  Physical Activity: Insufficiently Active   Days of Exercise per Week: 4 days   Minutes of Exercise per Session: 20 min  Stress: No Stress Concern Present   Feeling of Stress : Not at all  Social Connections: Unknown   Frequency of Communication with Friends and Family: More than three times a week   Frequency of Social Gatherings with Friends and Family: More than three times a week   Attends Religious Services: 1 to 4 times per year   Active Member of [employer] or Organizations: Yes   Attends Music therapist: Not on file   Marital Status: Not on file  Intimate Partner Violence: Not At Risk   Fear of Current  or Ex-Partner: No   Emotionally Abused: No   Physically Abused: No   Sexually Abused: No    Outpatient Medications Prior to Visit  Medication Sig Dispense Refill   amLODipine (NORVASC) 2.5 MG tablet TAKE 1 TABLET BY MOUTH EVERY DAY 90 tablet 1   Ascorbic Acid (VITAMIN C) 1000 MG tablet Take 1,000 mg by mouth daily.     bifidobacterium infantis (ALIGN) capsule Take 1 capsule by mouth daily.     calcium-vitamin D (OSCAL WITH D 500-200) 500-200 MG-UNIT per tablet Take 1 tablet by mouth daily.     Coenzyme Q10 (COQ10 PO) Take by mouth daily.     fish oil-omega-3 fatty acids 1000 MG capsule Take 2 g by mouth daily.     Fluticasone Furoate (FLONASE SENSIMIST NA) Place into the nose.     multivitamin (THERAGRAN) per tablet Take 1 tablet by mouth daily.     neomycin-polymyxin-hydrocortisone (CORTISPORIN) OTIC solution Place 3 drops into both ears 3 (three) times daily as needed. 10 mL 0   polyethylene glycol powder (GLYCOLAX/MIRALAX) 17 GM/SCOOP powder Take 17 g by mouth as needed.     rosuvastatin (CRESTOR) 20 MG tablet TAKE 1 TABLET BY MOUTH EVERY DAY 90 tablet 1   zinc gluconate 50 MG tablet Take 50 mg by mouth daily.     No facility-administered medications prior to visit.    No Known Allergies  Review of Systems  Genitourinary:  Positive for dysuria, frequency, hematuria and urgency. Negative for vaginal bleeding and vaginal discharge.  All other systems reviewed and are negative.     Objective:    Physical Exam Vitals and nursing note reviewed.  Constitutional:      Appearance: Normal appearance. She is normal weight.  Cardiovascular:     Rate and Rhythm: Normal rate and regular rhythm.     Pulses: Normal pulses.     Heart sounds: Normal heart sounds.  Pulmonary:     Effort: Pulmonary effort is normal.     Breath sounds: Normal breath sounds.  Abdominal:     General: Abdomen is flat. Bowel sounds are normal.     Palpations: Abdomen is soft.     Tenderness: There is no  guarding or rebound.  Musculoskeletal:        General: Normal range of motion.  Skin:    General: Skin is warm and dry.  Neurological:     General: No focal deficit present.     Mental Status: She is alert and oriented to person, place, and time.  Psychiatric:        Mood and Affect: Mood normal.        Behavior: Behavior normal.    BP (!) 142/86   Pulse 76   Temp (!) 96.1 F (35.6 C)   Ht 5' 2.99" (1.6 m)   Wt 170 lb (77.1 kg)   LMP 12/12/1981   SpO2 99%   BMI 30.12 kg/m  Wt Readings from Last 3 Encounters:  09/14/21 170 lb (77.1 kg)  07/26/21 178 lb (80.7 kg)  07/15/21 178 lb 3.2 oz (80.8 kg)    Health Maintenance Due  Topic Date Due   TETANUS/TDAP  04/17/2021   INFLUENZA VACCINE  07/12/2021    There are no preventive care reminders to display for this patient.   Lab Results  Component Value Date   TSH 1.26 07/15/2021   Lab Results  Component Value Date   WBC 7.5 07/15/2021   HGB 13.2 07/15/2021   HCT 39.4 07/15/2021   MCV 90.1 07/15/2021   PLT 331.0 07/15/2021   Lab Results  Component Value Date   NA 140 07/15/2021   K 4.2 07/15/2021   CO2 25 07/15/2021   GLUCOSE 89 07/15/2021   BUN 9 07/15/2021   CREATININE 0.95 07/15/2021   BILITOT 0.6 07/15/2021   ALKPHOS 61 07/15/2021   AST 12 07/15/2021   ALT 11 07/15/2021   PROT 7.1 07/15/2021   ALBUMIN 4.3 07/15/2021   CALCIUM 9.6 07/15/2021   ANIONGAP 10 07/11/2013   GFR 61.36 07/15/2021   Lab Results  Component Value Date   CHOL 157 07/15/2021   Lab Results  Component Value Date   HDL 56.30 07/15/2021   Lab Results  Component Value Date   LDLCALC 61 07/15/2021   Lab Results  Component Value Date   TRIG 199.0 (H) 07/15/2021   Lab Results  Component Value Date   CHOLHDL 3 07/15/2021   No results found for: HGBA1C     Assessment & Plan:   Problem List Items Addressed This Visit   None Visit Diagnoses     Bacterial urinary infection    -  Primary   Relevant Medications    sulfamethoxazole-trimethoprim (BACTRIM DS) 800-160 MG tablet   Other Relevant Orders   Urine Culture   Dysuria            Meds ordered this encounter  Medications   sulfamethoxazole-trimethoprim (BACTRIM DS) 800-160 MG tablet    Sig: Take 1 tablet by mouth 2 (two) times daily.    Dispense:  14 tablet    Refill:  0   Call the office with any questions or concerns. Recheck as scheduled and sooner as needed. Culture sent, will follow-up  Kennyth Arnold, FNP

## 2021-09-14 NOTE — Telephone Encounter (Signed)
Patient has been scheduled today with NP in office.

## 2021-09-15 LAB — URINE CULTURE
MICRO NUMBER:: 12458277
Result:: NO GROWTH
SPECIMEN QUALITY:: ADEQUATE

## 2021-09-16 ENCOUNTER — Ambulatory Visit (INDEPENDENT_AMBULATORY_CARE_PROVIDER_SITE_OTHER): Payer: Medicare HMO

## 2021-09-16 ENCOUNTER — Other Ambulatory Visit: Payer: Self-pay

## 2021-09-16 DIAGNOSIS — Z23 Encounter for immunization: Secondary | ICD-10-CM | POA: Diagnosis not present

## 2021-09-17 ENCOUNTER — Encounter: Payer: Self-pay | Admitting: Internal Medicine

## 2021-11-02 ENCOUNTER — Other Ambulatory Visit: Payer: Self-pay | Admitting: Internal Medicine

## 2021-11-02 DIAGNOSIS — R030 Elevated blood-pressure reading, without diagnosis of hypertension: Secondary | ICD-10-CM

## 2021-11-11 ENCOUNTER — Other Ambulatory Visit: Payer: Self-pay

## 2021-11-11 ENCOUNTER — Ambulatory Visit
Admission: RE | Admit: 2021-11-11 | Discharge: 2021-11-11 | Disposition: A | Discharge status: Home / Self Care | Payer: Medicare HMO | Source: Ambulatory Visit | Attending: Internal Medicine | Admitting: Internal Medicine

## 2021-11-11 DIAGNOSIS — Z1231 Encounter for screening mammogram for malignant neoplasm of breast: Secondary | ICD-10-CM | POA: Diagnosis not present

## 2021-11-16 ENCOUNTER — Other Ambulatory Visit: Payer: Self-pay

## 2021-11-16 ENCOUNTER — Ambulatory Visit (INDEPENDENT_AMBULATORY_CARE_PROVIDER_SITE_OTHER): Payer: Medicare HMO | Admitting: Internal Medicine

## 2021-11-16 ENCOUNTER — Encounter: Payer: Self-pay | Admitting: Internal Medicine

## 2021-11-16 DIAGNOSIS — E039 Hypothyroidism, unspecified: Secondary | ICD-10-CM

## 2021-11-16 DIAGNOSIS — I10 Essential (primary) hypertension: Secondary | ICD-10-CM | POA: Diagnosis not present

## 2021-11-16 DIAGNOSIS — Z87448 Personal history of other diseases of urinary system: Secondary | ICD-10-CM | POA: Diagnosis not present

## 2021-11-16 DIAGNOSIS — H60503 Unspecified acute noninfective otitis externa, bilateral: Secondary | ICD-10-CM

## 2021-11-16 DIAGNOSIS — Z9109 Other allergy status, other than to drugs and biological substances: Secondary | ICD-10-CM

## 2021-11-16 DIAGNOSIS — E7800 Pure hypercholesterolemia, unspecified: Secondary | ICD-10-CM

## 2021-11-16 DIAGNOSIS — R69 Illness, unspecified: Secondary | ICD-10-CM | POA: Diagnosis not present

## 2021-11-16 DIAGNOSIS — F419 Anxiety disorder, unspecified: Secondary | ICD-10-CM

## 2021-11-16 LAB — HEPATIC FUNCTION PANEL
ALT: 11 U/L (ref 0–35)
AST: 13 U/L (ref 0–37)
Albumin: 4.2 g/dL (ref 3.5–5.2)
Alkaline Phosphatase: 57 U/L (ref 39–117)
Bilirubin, Direct: 0.1 mg/dL (ref 0.0–0.3)
Total Bilirubin: 0.6 mg/dL (ref 0.2–1.2)
Total Protein: 7.1 g/dL (ref 6.0–8.3)

## 2021-11-16 LAB — BASIC METABOLIC PANEL
BUN: 13 mg/dL (ref 6–23)
CO2: 26 mEq/L (ref 19–32)
Calcium: 9.7 mg/dL (ref 8.4–10.5)
Chloride: 104 mEq/L (ref 96–112)
Creatinine, Ser: 0.96 mg/dL (ref 0.40–1.20)
GFR: 60.45 mL/min (ref >60.00)
Glucose, Bld: 97 mg/dL (ref 70–99)
Potassium: 3.9 mEq/L (ref 3.5–5.1)
Sodium: 139 mEq/L (ref 135–145)

## 2021-11-16 LAB — LIPID PANEL
Cholesterol: 148 mg/dL (ref 0–200)
HDL: 55.1 mg/dL (ref >39.00)
LDL Cholesterol: 63 mg/dL (ref 0–99)
NonHDL: 93.29
Total CHOL/HDL Ratio: 3
Triglycerides: 149 mg/dL (ref 0.0–149.0)
VLDL: 29.8 mg/dL (ref 0.0–40.0)

## 2021-11-16 MED ORDER — NEOMYCIN-POLYMYXIN-HC 3.5-10000-1 OT SOLN: 3.0000 [drp] | Freq: Three times a day (TID) | OTIC | 0 refills | Status: DC | PRN

## 2021-11-16 MED ORDER — AZELASTINE HCL 0.1 % NA SOLN: 1.0000 | Freq: Two times a day (BID) | NASAL | 1 refills | Status: DC

## 2021-11-16 NOTE — Assessment & Plan Note (Signed)
Continue amlodipine.  Follow pressures.  Follow metabolic panel.   

## 2021-11-16 NOTE — Assessment & Plan Note (Signed)
On thyroid replacement.  Follow tsh.  

## 2021-11-16 NOTE — Assessment & Plan Note (Signed)
Continue pravastatin.  Ow cholesterol diet and exercise.  Follow lipid panel and liver function tests.

## 2021-11-16 NOTE — Progress Notes (Signed)
Patient ID: Jill Torres, female   DOB: Jan 17, 1951, 70 y.o.   MRN: [id number]   Subjective:    Patient ID: Jill Torres, female    DOB: 01/11/1951, 70 y.o.   MRN: [id number]  This visit occurred during the SARS-CoV-2 public health emergency.  Safety protocols were in place, including screening questions prior to the visit, additional usage of staff PPE, and extensive cleaning of exam room while observing appropriate contact time as indicated for disinfecting solutions.   Patient here for a scheduled follow up.   Marland Kitchen   HPI Here to follow up regarding her cholesterol and blood pressure.  She is doing relatively well.  Does report increased nasal congestion and drainage.  Increased left sinus pressure and left ear fullness.  No sore throat.  No chest pain or chest tightness.  No sob.  No increased cough or chest congestion.  Drainage/dripping.  Using flonase.  Taking zyrted.  Had covid in 07/2021.  Stays active.  No abdominal pain or bowel change reported.    Past Medical History:  Diagnosis Date   Allergy    Arthritis    Depression    Diverticulosis of colon (without mention of hemorrhage)    GERD (gastroesophageal reflux disease)    History of chicken pox    History of shingles may 2013   HLD (hyperlipidemia)    HTN (hypertension)    Irritable bowel syndrome    Unspecified hypothyroidism    Past Surgical History:  Procedure Laterality Date   ABDOMINAL HYSTERECTOMY  1983   due to heavy bleeding & ovary cyst   APPENDECTOMY  1974   BLADDER DIVERTICULECTOMY  1990   CESAREAN SECTION     x 2   CHOLECYSTECTOMY     DILATION AND CURETTAGE OF UTERUS     x 2   DIRECT LARYNGOSCOPY Right 06/11/2015   Procedure: suspension microdirect laryngoscopy with biopsy of right tongue base;  Surgeon: Carloyn Manner, MD;  Location: [facility];  Service: ENT;  Laterality: Right;   [place]   cyst on ovary and heavy bleeding    Family History   Problem Relation Age of Onset   Arthritis Mother    Hypertension Mother    Hyperlipidemia Mother    Atrial fibrillation Mother    Heart disease Mother    Diabetes Mother    Colon polyps Mother    Hyperlipidemia Father    Heart disease Father        s/p CABG   Stroke Father    Rheumatic fever Sister        s/p valve replacement   Deep vein thrombosis Brother        after immobilization    Colon polyps Brother    Heart disease Brother    Diabetes Maternal Grandfather    Heart disease Brother    Diabetes Maternal Aunt    Diabetes Maternal Uncle    Breast cancer Neg Hx    Colon cancer Neg Hx    Esophageal cancer Neg Hx    Rectal cancer Neg Hx    Stomach cancer Neg Hx    Social History   Socioeconomic History   Marital status: Single    Spouse name: Not on file   Number of children: 2   Years of education: Not on file   Highest education level: Not on file  Occupational History   Occupation: retired    Comment: Chief of Staff  Tobacco Use   Smoking status: Never   Smokeless tobacco: Never  Substance and Sexual Activity   Alcohol use: No    Alcohol/week: 0.0 standard drinks   Drug use: No   Sexual activity: Not on file  Other Topics Concern   Not on file  Social History Narrative   No regular exercise   Lives in noisy environment- very stressful    Daily caffeine use    Social Determinants of Health   Financial Resource Strain: Low Risk    Difficulty of Paying Living Expenses: Not hard at all  Food Insecurity: No Food Insecurity   Worried About Charity fundraiser in the Last Year: Never true   Ran Out of Food in the Last Year: Never true  Transportation Needs: No Transportation Needs   Lack of Transportation (Medical): No   Lack of Transportation (Non-Medical): No  Physical Activity: Insufficiently Active   Days of Exercise per Week: 4 days   Minutes of Exercise per Session: 20 min  Stress: No Stress Concern Present   Feeling of Stress : Not  at all  Social Connections: Unknown   Frequency of Communication with Friends and Family: More than three times a week   Frequency of Social Gatherings with Friends and Family: More than three times a week   Attends Religious Services: 1 to 4 times per year   Active Member of [employer] or Organizations: Yes   Attends Archivist Meetings: Not on file   Marital Status: Not on file     Review of Systems  Constitutional:  Negative for appetite change and unexpected weight change.  HENT:  Positive for congestion and postnasal drip. Negative for sore throat.   Respiratory:  Negative for cough, chest tightness and shortness of breath.   Cardiovascular:  Negative for chest pain, palpitations and leg swelling.  Gastrointestinal:  Negative for abdominal pain, diarrhea, nausea and vomiting.  Genitourinary:  Negative for difficulty urinating and dysuria.  Musculoskeletal:  Negative for joint swelling and myalgias.  Skin:  Negative for color change and rash.  Neurological:  Negative for dizziness, light-headedness and headaches.  Psychiatric/Behavioral:  Negative for agitation and dysphoric mood.       Objective:     BP 124/76 (BP Location: Left Arm, Patient Position: Sitting, Cuff Size: Large)   Pulse 75   Temp (!) 96.8 F (36 C) (Temporal)   Ht 5' 2.99" (1.6 m)   Wt 170 lb 3.2 oz (77.2 kg)   LMP 12/12/1981   SpO2 97%   BMI 30.16 kg/m  Wt Readings from Last 3 Encounters:  11/16/21 170 lb 3.2 oz (77.2 kg)  09/14/21 170 lb (77.1 kg)  07/26/21 178 lb (80.7 kg)    Physical Exam Vitals reviewed.  Constitutional:      General: She is not in acute distress.    Appearance: Normal appearance.  HENT:     Head: Normocephalic and atraumatic.     Right Ear: External ear normal.     Left Ear: External ear normal.     Ears:     Comments: Left ear canal - question of minimal scaling and minimal inflammation.  Eyes:     General: No scleral icterus.       Right eye: No discharge.         Left eye: No discharge.     Conjunctiva/sclera: Conjunctivae normal.  Neck:     Thyroid: No thyromegaly.  Cardiovascular:     Rate and Rhythm: Normal  rate and regular rhythm.  Pulmonary:     Effort: No respiratory distress.     Breath sounds: Normal breath sounds. No wheezing.  Abdominal:     General: Bowel sounds are normal.     Palpations: Abdomen is soft.     Tenderness: There is no abdominal tenderness.  Musculoskeletal:        General: No swelling or tenderness.     Cervical back: Neck supple. No tenderness.  Lymphadenopathy:     Cervical: No cervical adenopathy.  Skin:    Findings: No erythema or rash.  Neurological:     Mental Status: She is alert.  Psychiatric:        Mood and Affect: Mood normal.        Behavior: Behavior normal.     Outpatient Encounter Medications as of 11/16/2021  Medication Sig   amLODipine (NORVASC) 2.5 MG tablet TAKE 1 TABLET BY MOUTH EVERY DAY   Ascorbic Acid (VITAMIN C) 1000 MG tablet Take 1,000 mg by mouth daily.   azelastine (ASTELIN) 0.1 % nasal spray Place 1 spray into both nostrils 2 (two) times daily. Use in each nostril as directed   bifidobacterium infantis (ALIGN) capsule Take 1 capsule by mouth daily.   calcium-vitamin D (OSCAL WITH D 500-200) 500-200 MG-UNIT per tablet Take 1 tablet by mouth daily.   Coenzyme Q10 (COQ10 PO) Take by mouth daily.   fish oil-omega-3 fatty acids 1000 MG capsule Take 2 g by mouth daily.   Fluticasone Furoate (FLONASE SENSIMIST NA) Place into the nose.   multivitamin (THERAGRAN) per tablet Take 1 tablet by mouth daily.   polyethylene glycol powder (GLYCOLAX/MIRALAX) 17 GM/SCOOP powder Take 17 g by mouth as needed.   rosuvastatin (CRESTOR) 20 MG tablet TAKE 1 TABLET BY MOUTH EVERY DAY   [DISCONTINUED] neomycin-polymyxin-hydrocortisone (CORTISPORIN) OTIC solution Place 3 drops into both ears 3 (three) times daily as needed.   neomycin-polymyxin-hydrocortisone (CORTISPORIN) OTIC solution Place 3 drops  into both ears 3 (three) times daily as needed.   [DISCONTINUED] sulfamethoxazole-trimethoprim (BACTRIM DS) 800-160 MG tablet Take 1 tablet by mouth 2 (two) times daily. (Patient not taking: Reported on 11/16/2021)   [DISCONTINUED] zinc gluconate 50 MG tablet Take 50 mg by mouth daily. (Patient not taking: Reported on 11/16/2021)   No facility-administered encounter medications on file as of 11/16/2021.     Lab Results  Component Value Date   WBC 7.5 07/15/2021   HGB 13.2 07/15/2021   HCT 39.4 07/15/2021   PLT 331.0 07/15/2021   GLUCOSE 97 11/16/2021   CHOL 148 11/16/2021   TRIG 149.0 11/16/2021   HDL 55.10 11/16/2021   LDLDIRECT 155.8 06/27/2013   LDLCALC 63 11/16/2021   ALT 11 11/16/2021   AST 13 11/16/2021   NA 139 11/16/2021   K 3.9 11/16/2021   CL 104 11/16/2021   CREATININE 0.96 11/16/2021   BUN 13 11/16/2021   CO2 26 11/16/2021   TSH 1.26 07/15/2021    MM 3D SCREEN BREAST BILATERAL  Result Date: 11/12/2021 CLINICAL DATA:  Screening. EXAM: DIGITAL SCREENING BILATERAL MAMMOGRAM WITH TOMOSYNTHESIS AND CAD TECHNIQUE: Bilateral screening digital craniocaudal and mediolateral oblique mammograms were obtained. Bilateral screening digital breast tomosynthesis was performed. The images were evaluated with computer-aided detection. COMPARISON:  Previous exam(s). ACR Breast Density Category b: There are scattered areas of fibroglandular density. FINDINGS: There are no findings suspicious for malignancy. IMPRESSION: No mammographic evidence of malignancy. A result letter of this screening mammogram will be mailed directly to the patient. RECOMMENDATION:  Screening mammogram in one year. (Code:[id number]-B-01Y) BI-RADS CATEGORY  1: Negative. Electronically Signed   By: Ammie Ferrier M.D.   On: 11/12/2021 06:28      Assessment & Plan:   Problem List Items Addressed This Visit     Anxiety    On no medication.  Appears to be doing well.  Follow.       Environmental allergies    Drainage  as outlined.  Sinus pressure.  Continue zyrtec and flonase.  Add astelin nasal spray.  Follow.  Notify me if persistent.       History of hematuria    Urine 12/2019 - no red blood cells.        HTN (hypertension)    Continue amlodipine.  Follow pressures.  Follow metabolic panel.       Relevant Orders   Basic metabolic panel (Completed)   Hypercholesterolemia    Continue pravastatin.  Ow cholesterol diet and exercise.  Follow lipid panel and liver function tests.       Relevant Orders   Lipid panel (Completed)   Hepatic function panel (Completed)   Hypothyroidism    On thyroid replacement.  Follow tsh.       Otitis externa    Cortisporin otic as directed.  Notify me if does not completely resolve.          [name], MD

## 2021-11-21 ENCOUNTER — Encounter: Payer: Self-pay | Admitting: Internal Medicine

## 2021-11-21 NOTE — Assessment & Plan Note (Signed)
Drainage as outlined.  Sinus pressure.  Continue zyrtec and flonase.  Add astelin nasal spray.  Follow.  Notify me if persistent.

## 2021-11-21 NOTE — Assessment & Plan Note (Signed)
Cortisporin otic as directed.  Notify me if does not completely resolve.

## 2021-11-21 NOTE — Assessment & Plan Note (Signed)
On no medication.  Appears to be doing well.  Follow.

## 2021-11-21 NOTE — Assessment & Plan Note (Signed)
Urine 12/2019 - no red blood cells.

## 2021-12-12 ENCOUNTER — Other Ambulatory Visit: Payer: Self-pay | Admitting: Internal Medicine

## 2022-01-07 ENCOUNTER — Other Ambulatory Visit: Payer: Self-pay | Admitting: Internal Medicine

## 2022-02-06 ENCOUNTER — Other Ambulatory Visit: Payer: Self-pay | Admitting: Internal Medicine

## 2022-02-10 ENCOUNTER — Ambulatory Visit (INDEPENDENT_AMBULATORY_CARE_PROVIDER_SITE_OTHER): Payer: Medicare HMO

## 2022-02-10 VITALS — BP 123/73 | Ht 63.0 in | Wt 170.0 lb

## 2022-02-10 DIAGNOSIS — Z0000 Encounter for general adult medical examination without abnormal findings: Secondary | ICD-10-CM | POA: Diagnosis not present

## 2022-02-10 NOTE — Patient Instructions (Addendum)
Jill Torres , Thank you for taking time to come for your Medicare Wellness Visit. I appreciate your ongoing commitment to your health goals. Please review the following plan we discussed and let me know if I can assist you in the future.   These are the goals we discussed:  Goals      Maintain Healthy Lifestyle     Stay active Healthy diet        This is a list of the screening recommended for you and due dates:  Health Maintenance  Topic Date Due   Zoster (Shingles) Vaccine (1 of 2) 05/13/2022*   Tetanus Vaccine  02/11/2023*   Mammogram  11/11/2022   Colon Cancer Screening  02/21/2029   Pneumonia Vaccine  Completed   Flu Shot  Completed   DEXA scan (bone density measurement)  Completed   Hepatitis C Screening: USPSTF Recommendation to screen - Ages 37-79 yo.  Completed   HPV Vaccine  Aged Out   COVID-19 Vaccine  Discontinued  *Topic was postponed. The date shown is not the original due date.      Advanced directives: not yet completed  Conditions/risks identified: none new  Follow up in one year for your annual wellness visit    Preventive Care 65 Years and Older, Female Preventive care refers to lifestyle choices and visits with your health care provider that can promote health and wellness. What does preventive care include? A yearly physical exam. This is also called an annual well check. Dental exams once or twice a year. Routine eye exams. Ask your health care provider how often you should have your eyes checked. Personal lifestyle choices, including: Daily care of your teeth and gums. Regular physical activity. Eating a healthy diet. Avoiding tobacco and drug use. Limiting alcohol use. Practicing safe sex. Taking low-dose aspirin every day. Taking vitamin and mineral supplements as recommended by your health care provider. What happens during an annual well check? The services and screenings done by your health care provider during your annual well check  will depend on your age, overall health, lifestyle risk factors, and family history of disease. Counseling  Your health care provider may ask you questions about your: Alcohol use. Tobacco use. Drug use. Emotional well-being. Home and relationship well-being. Sexual activity. Eating habits. History of falls. Memory and ability to understand (cognition). Work and work Statistician. Reproductive health. Screening  You may have the following tests or measurements: Height, weight, and BMI. Blood pressure. Lipid and cholesterol levels. These may be checked every 5 years, or more frequently if you are over 25 years old. Skin check. Lung cancer screening. You may have this screening every year starting at age 50 if you have a 30-pack-year history of smoking and currently smoke or have quit within the past 15 years. Fecal occult blood test (FOBT) of the stool. You may have this test every year starting at age 71. Flexible sigmoidoscopy or colonoscopy. You may have a sigmoidoscopy every 5 years or a colonoscopy every 10 years starting at age 8. Hepatitis C blood test. Hepatitis B blood test. Sexually transmitted disease (STD) testing. Diabetes screening. This is done by checking your blood sugar (glucose) after you have not eaten for a while (fasting). You may have this done every 1-3 years. Bone density scan. This is done to screen for osteoporosis. You may have this done starting at age 90. Mammogram. This may be done every 1-2 years. Talk to your health care provider about how often you should have  regular mammograms. Talk with your health care provider about your test results, treatment options, and if necessary, the need for more tests. Vaccines  Your health care provider may recommend certain vaccines, such as: Influenza vaccine. This is recommended every year. Tetanus, diphtheria, and acellular pertussis (Tdap, Td) vaccine. You may need a Td booster every 10 years. Zoster vaccine. You  may need this after age 69. Pneumococcal 13-valent conjugate (PCV13) vaccine. One dose is recommended after age 83. Pneumococcal polysaccharide (PPSV23) vaccine. One dose is recommended after age 29. Talk to your health care provider about which screenings and vaccines you need and how often you need them. This information is not intended to replace advice given to you by your health care provider. Make sure you discuss any questions you have with your health care provider. Document Released: 12/25/2015 Document Revised: 08/17/2016 Document Reviewed: 09/29/2015 Elsevier Interactive Patient Education  2017 [employer] Prevention in the Home Falls can cause injuries. They can happen to people of all ages. There are many things you can do to make your home safe and to help prevent falls. What can I do on the outside of my home? Regularly fix the edges of walkways and driveways and fix any cracks. Remove anything that might make you trip as you walk through a door, such as a raised step or threshold. Trim any bushes or trees on the path to your home. Use bright outdoor lighting. Clear any walking paths of anything that might make someone trip, such as rocks or tools. Regularly check to see if handrails are loose or broken. Make sure that both sides of any steps have handrails. Any raised decks and porches should have guardrails on the edges. Have any leaves, snow, or ice cleared regularly. Use sand or salt on walking paths during winter. Clean up any spills in your garage right away. This includes oil or grease spills. What can I do in the bathroom? Use night lights. Install grab bars by the toilet and in the tub and shower. Do not use towel bars as grab bars. Use non-skid mats or decals in the tub or shower. If you need to sit down in the shower, use a plastic, non-slip stool. Keep the floor dry. Clean up any water that spills on the floor as soon as it happens. Remove soap buildup  in the tub or shower regularly. Attach bath mats securely with double-sided non-slip rug tape. Do not have throw rugs and other things on the floor that can make you trip. What can I do in the bedroom? Use night lights. Make sure that you have a light by your bed that is easy to reach. Do not use any sheets or blankets that are too big for your bed. They should not hang down onto the floor. Have a firm chair that has side arms. You can use this for support while you get dressed. Do not have throw rugs and other things on the floor that can make you trip. What can I do in the kitchen? Clean up any spills right away. Avoid walking on wet floors. Keep items that you use a lot in easy-to-reach places. If you need to reach something above you, use a strong step stool that has a grab bar. Keep electrical cords out of the way. Do not use floor polish or wax that makes floors slippery. If you must use wax, use non-skid floor wax. Do not have throw rugs and other things on the floor that  can make you trip. What can I do with my stairs? Do not leave any items on the stairs. Make sure that there are handrails on both sides of the stairs and use them. Fix handrails that are broken or loose. Make sure that handrails are as long as the stairways. Check any carpeting to make sure that it is firmly attached to the stairs. Fix any carpet that is loose or worn. Avoid having throw rugs at the top or bottom of the stairs. If you do have throw rugs, attach them to the floor with carpet tape. Make sure that you have a light switch at the top of the stairs and the bottom of the stairs. If you do not have them, ask someone to add them for you. What else can I do to help prevent falls? Wear shoes that: Do not have high heels. Have rubber bottoms. Are comfortable and fit you well. Are closed at the toe. Do not wear sandals. If you use a stepladder: Make sure that it is fully opened. Do not climb a closed  stepladder. Make sure that both sides of the stepladder are locked into place. Ask someone to hold it for you, if possible. Clearly mark and make sure that you can see: Any grab bars or handrails. First and last steps. Where the edge of each step is. Use tools that help you move around (mobility aids) if they are needed. These include: Canes. Walkers. Scooters. Crutches. Turn on the lights when you go into a dark area. Replace any light bulbs as soon as they burn out. Set up your furniture so you have a clear path. Avoid moving your furniture around. If any of your floors are uneven, fix them. If there are any pets around you, be aware of where they are. Review your medicines with your doctor. Some medicines can make you feel dizzy. This can increase your chance of falling. Ask your doctor what other things that you can do to help prevent falls. This information is not intended to replace advice given to you by your health care provider. Make sure you discuss any questions you have with your health care provider. Document Released: 09/24/2009 Document Revised: 05/05/2016 Document Reviewed: 01/02/2015 Elsevier Interactive Patient Education  2017 [employer].

## 2022-02-10 NOTE — Progress Notes (Signed)
Subjective:   Jill Torres is a 71 y.o. female who presents for Medicare Annual (Subsequent) preventive examination.  Review of Systems    No ROS.  Medicare Wellness Virtual Visit.  Visual/audio telehealth visit, UTA vital signs.   See social history for additional risk factors.   Cardiac Risk Factors include: advanced age (>71men, >76 women)     Objective:    Today's Vitals   02/10/22 0832  BP: 123/73  Weight: 170 lb (77.1 kg)  Height: 5\' 3"  (1.6 m)   Body mass index is 30.11 kg/m.  Advanced Directives 02/10/2022 02/09/2021 02/07/2020 06/11/2015 06/04/2015  Does Patient Have a Medical Advance Directive? No No No No No  Would patient like information on creating a medical advance directive? No - Patient declined No - Patient declined No - Patient declined No - patient declined information -   Current Medications (verified) Outpatient Encounter Medications as of 02/10/2022  Medication Sig   amLODipine (NORVASC) 2.5 MG tablet TAKE 1 TABLET BY MOUTH EVERY DAY   Ascorbic Acid (VITAMIN C) 1000 MG tablet Take 1,000 mg by mouth daily.   Azelastine HCl 137 MCG/SPRAY SOLN PLACE 1 SPRAY INTO BOTH NOSTRILS 2 (TWO) TIMES DAILY. USE IN EACH NOSTRIL AS DIRECTED   bifidobacterium infantis (ALIGN) capsule Take 1 capsule by mouth daily.   calcium-vitamin D (OSCAL WITH D 500-200) 500-200 MG-UNIT per tablet Take 1 tablet by mouth daily.   Coenzyme Q10 (COQ10 PO) Take by mouth daily.   fish oil-omega-3 fatty acids 1000 MG capsule Take 2 g by mouth daily.   Fluticasone Furoate (FLONASE SENSIMIST NA) Place into the nose.   multivitamin (THERAGRAN) per tablet Take 1 tablet by mouth daily.   neomycin-polymyxin-hydrocortisone (CORTISPORIN) OTIC solution Place 3 drops into both ears 3 (three) times daily as needed.   polyethylene glycol powder (GLYCOLAX/MIRALAX) 17 GM/SCOOP powder Take 17 g by mouth as needed.   rosuvastatin (CRESTOR) 20 MG tablet TAKE 1 TABLET BY MOUTH EVERY DAY   No  facility-administered encounter medications on file as of 02/10/2022.   Allergies (verified) Patient has no known allergies.   History: Past Medical History:  Diagnosis Date   Allergy    Arthritis    Depression    Diverticulosis of colon (without mention of hemorrhage)    GERD (gastroesophageal reflux disease)    History of chicken pox    History of shingles may 2013   HLD (hyperlipidemia)    HTN (hypertension)    Irritable bowel syndrome    Unspecified hypothyroidism    Past Surgical History:  Procedure Laterality Date   ABDOMINAL HYSTERECTOMY  1983   due to heavy bleeding & ovary cyst   APPENDECTOMY  1974   BLADDER DIVERTICULECTOMY  1990   CESAREAN SECTION     x 2   CHOLECYSTECTOMY     DILATION AND CURETTAGE OF UTERUS     x 2   DIRECT LARYNGOSCOPY Right 06/11/2015   Procedure: suspension microdirect laryngoscopy with biopsy of right tongue base;  Surgeon: Carloyn Manner, MD;  Location: [facility] ORS;  Service: ENT;  Laterality: Right;   [place]   cyst on ovary and heavy bleeding    Family History  Problem Relation Age of Onset   Arthritis Mother    Hypertension Mother    Hyperlipidemia Mother    Atrial fibrillation Mother    Heart disease Mother    Diabetes Mother    Colon polyps Mother  Hyperlipidemia Father    Heart disease Father        s/p CABG   Stroke Father    Rheumatic fever Sister        s/p valve replacement   Deep vein thrombosis Brother        after immobilization    Colon polyps Brother    Heart disease Brother    Diabetes Maternal Grandfather    Heart disease Brother    Diabetes Maternal Aunt    Diabetes Maternal Uncle    Breast cancer Neg Hx    Colon cancer Neg Hx    Esophageal cancer Neg Hx    Rectal cancer Neg Hx    Stomach cancer Neg Hx    Social History   Socioeconomic History   Marital status: Single    Spouse name: Not on file   Number of children: 2   Years of education: Not on  file   Highest education level: Not on file  Occupational History   Occupation: retired    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   Tobacco Use   Smoking status: Never   Smokeless tobacco: Never  Substance and Sexual Activity   Alcohol use: No    Alcohol/week: 0.0 standard drinks   Drug use: No   Sexual activity: Not on file  Other Topics Concern   Not on file  Social History Narrative   No regular exercise   Lives in noisy environment- very stressful    Daily caffeine use    Social Determinants of Health   Financial Resource Strain: Low Risk    Difficulty of Paying Living Expenses: Not hard at all  Food Insecurity: No Food Insecurity   Worried About Charity fundraiser in the Last Year: Never true   [place] in the Last Year: Never true  Transportation Needs: No Transportation Needs   Lack of Transportation (Medical): No   Lack of Transportation (Non-Medical): No  Physical Activity: Insufficiently Active   Days of Exercise per Week: 4 days   Minutes of Exercise per Session: 20 min  Stress: No Stress Concern Present   Feeling of Stress : Not at all  Social Connections: Socially Integrated   Frequency of Communication with Friends and Family: More than three times a week   Frequency of Social Gatherings with Friends and Family: More than three times a week   Attends Religious Services: 1 to 4 times per year   Active Member of [employer] or Organizations: Yes   Attends Music therapist: Not on file   Marital Status: Married   Tobacco Counseling Counseling given: Not Answered  Clinical Intake:  Pre-visit preparation completed: Yes        Diabetes: No  How often do you need to have someone help you when you read instructions, pamphlets, or other written materials from your doctor or pharmacy?: 1 - Never  Interpreter Needed?: No    Activities of Daily Living In your present state of health, do you have any difficulty performing the following  activities: 02/10/2022  Hearing? N  Vision? N  Difficulty concentrating or making decisions? N  Walking or climbing stairs? N  Dressing or bathing? N  Doing errands, shopping? N  Preparing Food and eating ? N  Using the Toilet? N  In the past six months, have you accidently leaked urine? N  Do you have problems with loss of bowel control? N  Managing your Medications? N  Managing your Finances? N  Housekeeping or managing your Housekeeping? N  Some recent data might be hidden    Patient Care Team: [name], MD as PCP - General (Internal Medicine)  Indicate any recent Medical Services you may have received from other than [facility] providers in the past year (date may be approximate).     Assessment:   This is a routine wellness examination for Jill Torres.  Virtual Visit via Telephone Note  I connected with  Jill Torres on 02/10/22 at  8:15 AM EST by telephone and verified that I am speaking with the correct person using two identifiers.  Persons participating in the virtual visit: patient/Nurse Health Advisor   I discussed the limitations, risks, security and privacy concerns of performing an evaluation and management service by telephone and the availability of in person appointments. The patient expressed understanding and agreed to proceed.  Interactive audio and video telecommunications were attempted between this nurse and patient, however failed, due to patient having technical difficulties OR patient did not have access to video capability.  We continued and completed visit with audio only.  Some vital signs may be absent or patient reported.   Hearing/Vision screen Hearing Screening - Comments:: Patient is able to hear conversational tones without difficulty. No issues reported. Vision Screening - Comments:: Followed by Dr. Matilde Sprang  Wears corrective lenses   Dietary issues and exercise activities discussed: Current Exercise Habits: Home exercise routine, Type of  exercise: walking, Intensity: Mild Healthy diet Good water intake   Goals Addressed             This Visit's Progress    Maintain Healthy Lifestyle       Stay active Healthy diet       Depression Screen PHQ 2/9 Scores 02/10/2022 09/14/2021 07/15/2021 02/09/2021 02/07/2020 02/07/2020 02/13/2017  PHQ - 2 Score 0 0 0 0 0 0 0    Fall Risk Fall Risk  02/10/2022 09/14/2021 07/15/2021 02/09/2021 02/07/2020  Falls in the past year? 0 0 0 0 0  Number falls in past yr: - 0 0 0 -  Injury with Fall? - 0 0 0 -  Follow up Falls evaluation completed Falls evaluation completed Falls evaluation completed Falls evaluation completed Falls evaluation completed   [place]:  Home free of loose throw rugs in walkways, pet beds, electrical cords, etc? Yes  Adequate lighting in your home to reduce risk of falls? Yes   ASSISTIVE DEVICES UTILIZED TO PREVENT FALLS: Use of a cane, walker or w/c? No   TIMED UP AND GO: Was the test performed? No .   Cognitive Function:  Patient is alert and oriented x3.    6CIT Screen 02/10/2022 02/07/2020  What Year? 0 points 0 points  What month? 0 points 0 points  What time? 0 points 0 points  Count back from 20 - 0 points  Months in reverse 0 points 0 points  Repeat phrase - 0 points  Total Score - 0   Immunizations Immunization History  Administered Date(s) Administered   Fluad Quad(high Dose 65+) 09/16/2021   Influenza Split 08/29/2014   Influenza, High Dose Seasonal PF 08/16/2018, 09/30/2020   Influenza,inj,Quad PF,6+ Mos 08/13/2019   Influenza-Unspecified 08/27/2015, 08/21/2017   PFIZER(Purple Top)SARS-COV-2 Vaccination 04/23/2020, 05/14/2020   Pneumococcal Conjugate-13 04/11/2018   Pneumococcal Polysaccharide-23 07/26/2019   Tdap 04/18/2011   TDAP status: Due, Education has been provided regarding the importance of this vaccine. Advised may receive this vaccine at local pharmacy or Health Dept. Aware  to provide a copy of the  vaccination record if obtained from local pharmacy or Health Dept. Verbalized acceptance and understanding.   Shingrix Completed?: No.    Education has been provided regarding the importance of this vaccine. Patient has been advised to call insurance company to determine out of pocket expense if they have not yet received this vaccine. Advised may also receive vaccine at local pharmacy or Health Dept. Verbalized acceptance and understanding.  Screening Tests Health Maintenance  Topic Date Due   Zoster Vaccines- Shingrix (1 of 2) 05/13/2022 (Originally 07/20/2002)   TETANUS/TDAP  02/11/2023 (Originally 04/17/2021)   MAMMOGRAM  11/11/2022   COLONOSCOPY (Pts 45-54yrs Insurance coverage will need to be confirmed)  02/21/2029   Pneumonia Vaccine 34+ Years old  Completed   INFLUENZA VACCINE  Completed   DEXA SCAN  Completed   Hepatitis C Screening  Completed   HPV VACCINES  Aged Out   COVID-19 Vaccine  Discontinued   Health Maintenance There are no preventive care reminders to display for this patient.  Lung Cancer Screening: (Low Dose CT Chest recommended if Age 90-80 years, 30 pack-year currently smoking OR have quit w/in 15years.) does not qualify.   Vision Screening: Recommended annual ophthalmology exams for early detection of glaucoma and other disorders of the eye.  Dental Screening: Recommended annual dental exams for proper oral hygiene  Community Resource Referral / Chronic Care Management: CRR required this visit?  No   CCM required this visit?  No      Plan:   Keep all routine maintenance appointments.   I have personally reviewed and noted the following in the patients chart:   Medical and social history Use of alcohol, tobacco or illicit drugs  Current medications and supplements including opioid prescriptions.  Functional ability and status Nutritional status Physical activity Advanced directives List of other physicians Hospitalizations, surgeries, and ER visits  in previous 12 months Vitals Screenings to include cognitive, depression, and falls Referrals and appointments  In addition, I have reviewed and discussed with patient certain preventive protocols, quality metrics, and best practice recommendations. A written personalized care plan for preventive services as well as general preventive health recommendations were provided to patient.     Varney Biles, LPN   07/19/4165

## 2022-02-24 DIAGNOSIS — H5203 Hypermetropia, bilateral: Secondary | ICD-10-CM | POA: Diagnosis not present

## 2022-03-03 ENCOUNTER — Other Ambulatory Visit: Payer: Self-pay | Admitting: Internal Medicine

## 2022-03-22 ENCOUNTER — Ambulatory Visit (INDEPENDENT_AMBULATORY_CARE_PROVIDER_SITE_OTHER): Payer: Medicare HMO | Admitting: Internal Medicine

## 2022-03-22 ENCOUNTER — Encounter: Payer: Self-pay | Admitting: Internal Medicine

## 2022-03-22 VITALS — BP 130/84 | HR 76 | Temp 98.1°F | Resp 16 | Ht 63.0 in | Wt 175.8 lb

## 2022-03-22 DIAGNOSIS — E7800 Pure hypercholesterolemia, unspecified: Secondary | ICD-10-CM | POA: Diagnosis not present

## 2022-03-22 DIAGNOSIS — Z1231 Encounter for screening mammogram for malignant neoplasm of breast: Secondary | ICD-10-CM

## 2022-03-22 DIAGNOSIS — K5730 Diverticulosis of large intestine without perforation or abscess without bleeding: Secondary | ICD-10-CM | POA: Diagnosis not present

## 2022-03-22 DIAGNOSIS — Z86010 Personal history of colonic polyps: Secondary | ICD-10-CM

## 2022-03-22 DIAGNOSIS — K589 Irritable bowel syndrome without diarrhea: Secondary | ICD-10-CM | POA: Diagnosis not present

## 2022-03-22 DIAGNOSIS — R1032 Left lower quadrant pain: Secondary | ICD-10-CM

## 2022-03-22 DIAGNOSIS — E039 Hypothyroidism, unspecified: Secondary | ICD-10-CM

## 2022-03-22 DIAGNOSIS — Z9109 Other allergy status, other than to drugs and biological substances: Secondary | ICD-10-CM

## 2022-03-22 DIAGNOSIS — Z0000 Encounter for general adult medical examination without abnormal findings: Secondary | ICD-10-CM

## 2022-03-22 DIAGNOSIS — I10 Essential (primary) hypertension: Secondary | ICD-10-CM

## 2022-03-22 LAB — BASIC METABOLIC PANEL
BUN: 14 mg/dL (ref 6–23)
CO2: 27 mEq/L (ref 19–32)
Calcium: 10.1 mg/dL (ref 8.4–10.5)
Chloride: 102 mEq/L (ref 96–112)
Creatinine, Ser: 1.03 mg/dL (ref 0.40–1.20)
GFR: 55.42 mL/min — ABNORMAL LOW (ref >60.00)
Glucose, Bld: 89 mg/dL (ref 70–99)
Potassium: 4.1 mEq/L (ref 3.5–5.1)
Sodium: 138 mEq/L (ref 135–145)

## 2022-03-22 LAB — CBC WITH DIFFERENTIAL/PLATELET
Basophils Absolute: 0.1 10*3/uL (ref 0.0–0.1)
Basophils Relative: 1.3 % (ref 0.0–3.0)
Eosinophils Absolute: 0.1 10*3/uL (ref 0.0–0.7)
Eosinophils Relative: 1.8 % (ref 0.0–5.0)
HCT: 41 % (ref 36.0–46.0)
Hemoglobin: 13.8 g/dL (ref 12.0–15.0)
Lymphocytes Relative: 33 % (ref 12.0–46.0)
Lymphs Abs: 2.4 10*3/uL (ref 0.7–4.0)
MCHC: 33.6 g/dL (ref 30.0–36.0)
MCV: 92.3 fl (ref 78.0–100.0)
Monocytes Absolute: 0.5 10*3/uL (ref 0.1–1.0)
Monocytes Relative: 7.1 % (ref 3.0–12.0)
Neutro Abs: 4.1 10*3/uL (ref 1.4–7.7)
Neutrophils Relative %: 56.8 % (ref 43.0–77.0)
Platelets: 360 10*3/uL (ref 150.0–400.0)
RBC: 4.44 Mil/uL (ref 3.87–5.11)
RDW: 12.8 % (ref 11.5–15.5)
WBC: 7.1 10*3/uL (ref 4.0–10.5)

## 2022-03-22 LAB — HEPATIC FUNCTION PANEL
ALT: 19 U/L (ref 0–35)
AST: 19 U/L (ref 0–37)
Albumin: 4.5 g/dL (ref 3.5–5.2)
Alkaline Phosphatase: 64 U/L (ref 39–117)
Bilirubin, Direct: 0.1 mg/dL (ref 0.0–0.3)
Total Bilirubin: 0.6 mg/dL (ref 0.2–1.2)
Total Protein: 7.3 g/dL (ref 6.0–8.3)

## 2022-03-22 LAB — LIPID PANEL
Cholesterol: 175 mg/dL (ref 0–200)
HDL: 70 mg/dL (ref >39.00)
LDL Cholesterol: 73 mg/dL (ref 0–99)
NonHDL: 105.45
Total CHOL/HDL Ratio: 3
Triglycerides: 163 mg/dL — ABNORMAL HIGH (ref 0.0–149.0)
VLDL: 32.6 mg/dL (ref 0.0–40.0)

## 2022-03-22 NOTE — Assessment & Plan Note (Signed)
Continue amlodipine.  Follow pressures.  Follow metabolic panel.   

## 2022-03-22 NOTE — Assessment & Plan Note (Signed)
Physical today 03/22/22.  Mammogram 11/11/21 - Birads I.  Colonoscopy 02/2019 - 52m polyp (cecum).  ?

## 2022-03-22 NOTE — Patient Instructions (Signed)
Benefiber - can take daily.  ?

## 2022-03-22 NOTE — Progress Notes (Signed)
Patient ID: Jill Torres, female   DOB: 1951/05/23, 71 y.o.   MRN: [id number] ? ? ?Subjective:  ? ? Patient ID: Jill Torres, female    DOB: Apr 23, 1951, 71 y.o.   MRN: [id number] ? ?This visit occurred during the SARS-CoV-2 public health emergency.  Safety protocols were in place, including screening questions prior to the visit, additional usage of staff PPE, and extensive cleaning of exam room while observing appropriate contact time as indicated for disinfecting solutions.  ? ?Patient here for her physical exam.  ? ?Chief Complaint  ?Patient presents with  ? Follow-up  ?  CPE - no pap. Pt reports feeling well despite seasonal allergies.  ? .  ? ?HPI ?Reports some allergy issues - nasal congestion.  Taking zyrtec and astelin nasal spray - controls symptoms.  Does not feel needs anything more at this time.  No cough or chest congestion.  No acid reflux.  No abdominal pain.  Bowels moving.  Does have some flares with "IBS".  Diarrhea this weekend.  Some LLQ pain - notices with flares.  Some constipation issues.  Discussed fiber.  Handling stress.   ? ? ?Past Medical History:  ?Diagnosis Date  ? Allergy   ? Arthritis   ? Depression   ? Diverticulosis of colon (without mention of hemorrhage)   ? GERD (gastroesophageal reflux disease)   ? History of chicken pox   ? History of shingles may 2013  ? HLD (hyperlipidemia)   ? HTN (hypertension)   ? Irritable bowel syndrome   ? Unspecified hypothyroidism   ? ?Past Surgical History:  ?Procedure Laterality Date  ? ABDOMINAL HYSTERECTOMY  1983  ? due to heavy bleeding & ovary cyst  ? APPENDECTOMY  1974  ? BLADDER DIVERTICULECTOMY  1990  ? CESAREAN SECTION    ? x 2  ? CHOLECYSTECTOMY    ? DILATION AND CURETTAGE OF UTERUS    ? x 2  ? DIRECT LARYNGOSCOPY Right 06/11/2015  ? Procedure: suspension microdirect laryngoscopy with biopsy of right tongue base;  Surgeon: Carloyn Manner, MD;  Location: [facility] ORS;  Service: ENT;  Laterality: Right;  ? NASAL SEPTUM SURGERY  1999  ?  PARTIAL HYSTERECTOMY  1983  ? cyst on ovary and heavy bleeding   ? ?Family History  ?Problem Relation Age of Onset  ? Arthritis Mother   ? Hypertension Mother   ? Hyperlipidemia Mother   ? Atrial fibrillation Mother   ? Heart disease Mother   ? Diabetes Mother   ? Colon polyps Mother   ? Hyperlipidemia Father   ? Heart disease Father   ?     s/p CABG  ? Stroke Father   ? Rheumatic fever Sister   ?     s/p valve replacement  ? Deep vein thrombosis Brother   ?     after immobilization   ? Colon polyps Brother   ? Heart disease Brother   ? Diabetes Maternal Grandfather   ? Heart disease Brother   ? Diabetes Maternal Aunt   ? Diabetes Maternal Uncle   ? Breast cancer Neg Hx   ? Colon cancer Neg Hx   ? Esophageal cancer Neg Hx   ? Rectal cancer Neg Hx   ? Stomach cancer Neg Hx   ? ?Social History  ? ?Socioeconomic History  ? Marital status: Single  ?  Spouse name: Not on file  ? Number of children: 2  ? Years of education: Not on file  ? Highest  education level: Not on file  ?Occupational History  ? Occupation: retired  ?  Comment: [employer]   ?Tobacco Use  ? Smoking status: Never  ? Smokeless tobacco: Never  ?Substance and Sexual Activity  ? Alcohol use: No  ?  Alcohol/week: 0.0 standard drinks  ? Drug use: No  ? Sexual activity: Not on file  ?Other Topics Concern  ? Not on file  ?Social History Narrative  ? No regular exercise  ? Lives in noisy environment- very stressful   ? Daily caffeine use   ? ?Social Determinants of Health  ? ?Financial Resource Strain: Low Risk   ? Difficulty of Paying Living Expenses: Not hard at all  ?Food Insecurity: No Food Insecurity  ? Worried About Charity fundraiser in the Last Year: Never true  ? Ran Out of Food in the Last Year: Never true  ?Transportation Needs: No Transportation Needs  ? Lack of Transportation (Medical): No  ? Lack of Transportation (Non-Medical): No  ?Physical Activity: Insufficiently Active  ? Days of Exercise per Week: 4 days  ? Minutes of Exercise  per Session: 20 min  ?Stress: No Stress Concern Present  ? Feeling of Stress : Not at all  ?Social Connections: Socially Integrated  ? Frequency of Communication with Friends and Family: More than three times a week  ? Frequency of Social Gatherings with Friends and Family: More than three times a week  ? Attends Religious Services: 1 to 4 times per year  ? Active Member of Clubs or Organizations: Yes  ? Attends Archivist Meetings: Not on file  ? Marital Status: Married  ? ? ? ?Review of Systems  ?Constitutional:  Negative for appetite change and unexpected weight change.  ?HENT:  Positive for congestion. Negative for sinus pressure and sore throat.   ?Eyes:  Negative for pain and visual disturbance.  ?Respiratory:  Negative for cough, chest tightness and shortness of breath.   ?Cardiovascular:  Negative for chest pain, palpitations and leg swelling.  ?Gastrointestinal:  Positive for constipation. Negative for abdominal pain, diarrhea, nausea and vomiting.  ?Genitourinary:  Negative for difficulty urinating and dysuria.  ?Musculoskeletal:  Negative for joint swelling and myalgias.  ?Skin:  Negative for color change and rash.  ?Neurological:  Negative for dizziness, light-headedness and headaches.  ?Hematological:  Negative for adenopathy. Does not bruise/bleed easily.  ?Psychiatric/Behavioral:  Negative for agitation and dysphoric mood.   ? ?   ?Objective:  ?  ? ?BP 130/84 (BP Location: Left Arm, Patient Position: Sitting, Cuff Size: Small)   Pulse 76   Temp 98.1 ?F (36.7 ?C) (Temporal)   Resp 16   Ht '5\' 3"'$  (1.6 m)   Wt 175 lb 12.8 oz (79.7 kg)   LMP 12/12/1981   SpO2 97%   BMI 31.14 kg/m?  ?Wt Readings from Last 3 Encounters:  ?03/22/22 175 lb 12.8 oz (79.7 kg)  ?02/10/22 170 lb (77.1 kg)  ?11/16/21 170 lb 3.2 oz (77.2 kg)  ? ? ?Physical Exam ?Vitals reviewed.  ?Constitutional:   ?   General: She is not in acute distress. ?   Appearance: Normal appearance. She is well-developed.  ?HENT:  ?    Head: Normocephalic and atraumatic.  ?   Right Ear: External ear normal.  ?   Left Ear: External ear normal.  ?Eyes:  ?   General: No scleral icterus.    ?   Right eye: No discharge.     ?   Left  eye: No discharge.  ?   Conjunctiva/sclera: Conjunctivae normal.  ?Neck:  ?   Thyroid: No thyromegaly.  ?Cardiovascular:  ?   Rate and Rhythm: Normal rate and regular rhythm.  ?Pulmonary:  ?   Effort: No tachypnea, accessory muscle usage or respiratory distress.  ?   Breath sounds: Normal breath sounds. No decreased breath sounds or wheezing.  ?Chest:  ?Breasts: ?   Right: No inverted nipple, mass, nipple discharge or tenderness (no axillary adenopathy).  ?   Left: No inverted nipple, mass, nipple discharge or tenderness (no axilarry adenopathy).  ?Abdominal:  ?   General: Bowel sounds are normal.  ?   Palpations: Abdomen is soft.  ?   Comments: Minimal LLQ tenderness.    ?Musculoskeletal:     ?   General: No swelling or tenderness.  ?   Cervical back: Neck supple.  ?Lymphadenopathy:  ?   Cervical: No cervical adenopathy.  ?Skin: ?   Findings: No erythema or rash.  ?Neurological:  ?   Mental Status: She is alert and oriented to person, place, and time.  ?Psychiatric:     ?   Mood and Affect: Mood normal.     ?   Behavior: Behavior normal.  ? ? ? ?Outpatient Encounter Medications as of 03/22/2022  ?Medication Sig  ? amLODipine (NORVASC) 2.5 MG tablet TAKE 1 TABLET BY MOUTH EVERY DAY  ? Ascorbic Acid (VITAMIN C) 1000 MG tablet Take 1,000 mg by mouth daily.  ? Azelastine HCl 137 MCG/SPRAY SOLN PLACE 1 SPRAY INTO BOTH NOSTRILS 2 (TWO) TIMES DAILY. USE IN EACH NOSTRIL AS DIRECTED  ? bifidobacterium infantis (ALIGN) capsule Take 1 capsule by mouth daily.  ? calcium-vitamin D (OSCAL WITH D 500-200) 500-200 MG-UNIT per tablet Take 1 tablet by mouth daily.  ? Coenzyme Q10 (COQ10 PO) Take by mouth daily.  ? fish oil-omega-3 fatty acids 1000 MG capsule Take 2 g by mouth daily.  ? Fluticasone Furoate (FLONASE SENSIMIST NA) Place into  the nose.  ? multivitamin (THERAGRAN) per tablet Take 1 tablet by mouth daily.  ? neomycin-polymyxin-hydrocortisone (CORTISPORIN) OTIC solution Place 3 drops into both ears 3 (three) times daily as needed.  ? polyethyle

## 2022-03-22 NOTE — Assessment & Plan Note (Signed)
On crestor.  Low cholesterol diet and exercise.  Follow lipid panel and liver function tests.   

## 2022-03-23 ENCOUNTER — Encounter: Payer: Self-pay | Admitting: Internal Medicine

## 2022-03-23 NOTE — Assessment & Plan Note (Signed)
02/22/19 - cecal polyp.  Recommended f/u colonoscopy in 10 years.   ?

## 2022-03-23 NOTE — Assessment & Plan Note (Signed)
Has known diverticulosis.  Question of some of her flares - diverticular flare. Doing better now.  Minimal LLQ tenderness on exam. Discussed f/u CT scan.  Wants to monitor.  Will notify me if persistent pain or if recurring flare.  ?

## 2022-03-23 NOTE — Assessment & Plan Note (Signed)
Continue zyrtec and astelin nasal spray.  Relatively well controlled.  Does not feel needs any further intervention.  Follow.  ?

## 2022-03-23 NOTE — Assessment & Plan Note (Signed)
Relates flare this past weekend to IBS flare.  Discussed possible diverticular flare, etc.  Doing better now.  Discussed benefiber daily.  Discussed CT scan.  Wants to monitor.  Will notify me if persistent pain or if recurring flare.  ?

## 2022-03-23 NOTE — Assessment & Plan Note (Signed)
On thyroid replacement.  Follow tsh.  

## 2022-03-23 NOTE — Assessment & Plan Note (Signed)
Mammogram 11/12/21  Birads I.  

## 2022-03-25 ENCOUNTER — Other Ambulatory Visit: Payer: Self-pay

## 2022-03-25 DIAGNOSIS — I10 Essential (primary) hypertension: Secondary | ICD-10-CM

## 2022-03-26 ENCOUNTER — Other Ambulatory Visit: Payer: Self-pay | Admitting: Internal Medicine

## 2022-04-19 ENCOUNTER — Other Ambulatory Visit: Payer: Self-pay | Admitting: Internal Medicine

## 2022-04-22 ENCOUNTER — Other Ambulatory Visit (INDEPENDENT_AMBULATORY_CARE_PROVIDER_SITE_OTHER): Payer: Medicare HMO

## 2022-04-22 DIAGNOSIS — I10 Essential (primary) hypertension: Secondary | ICD-10-CM

## 2022-04-22 LAB — BASIC METABOLIC PANEL
BUN: 18 mg/dL (ref 6–23)
CO2: 26 mEq/L (ref 19–32)
Calcium: 9.3 mg/dL (ref 8.4–10.5)
Chloride: 103 mEq/L (ref 96–112)
Creatinine, Ser: 1.05 mg/dL (ref 0.40–1.20)
GFR: 54.12 mL/min — ABNORMAL LOW (ref >60.00)
Glucose, Bld: 91 mg/dL (ref 70–99)
Potassium: 4 mEq/L (ref 3.5–5.1)
Sodium: 138 mEq/L (ref 135–145)

## 2022-04-30 ENCOUNTER — Other Ambulatory Visit: Payer: Self-pay | Admitting: Internal Medicine

## 2022-04-30 DIAGNOSIS — R030 Elevated blood-pressure reading, without diagnosis of hypertension: Secondary | ICD-10-CM

## 2022-05-13 ENCOUNTER — Other Ambulatory Visit: Payer: Self-pay | Admitting: Internal Medicine

## 2022-06-05 ENCOUNTER — Other Ambulatory Visit: Payer: Self-pay | Admitting: Internal Medicine

## 2022-07-22 ENCOUNTER — Encounter: Payer: Self-pay | Admitting: Internal Medicine

## 2022-07-22 ENCOUNTER — Ambulatory Visit (INDEPENDENT_AMBULATORY_CARE_PROVIDER_SITE_OTHER): Payer: Medicare HMO | Admitting: Internal Medicine

## 2022-07-22 VITALS — BP 134/80 | HR 67 | Temp 98.1°F | Resp 15 | Ht 63.0 in | Wt 175.2 lb

## 2022-07-22 DIAGNOSIS — E7800 Pure hypercholesterolemia, unspecified: Secondary | ICD-10-CM | POA: Diagnosis not present

## 2022-07-22 DIAGNOSIS — E039 Hypothyroidism, unspecified: Secondary | ICD-10-CM | POA: Diagnosis not present

## 2022-07-22 DIAGNOSIS — F419 Anxiety disorder, unspecified: Secondary | ICD-10-CM

## 2022-07-22 DIAGNOSIS — I10 Essential (primary) hypertension: Secondary | ICD-10-CM

## 2022-07-22 DIAGNOSIS — R69 Illness, unspecified: Secondary | ICD-10-CM | POA: Diagnosis not present

## 2022-07-22 DIAGNOSIS — Z1231 Encounter for screening mammogram for malignant neoplasm of breast: Secondary | ICD-10-CM | POA: Diagnosis not present

## 2022-07-22 DIAGNOSIS — Z9109 Other allergy status, other than to drugs and biological substances: Secondary | ICD-10-CM

## 2022-07-22 DIAGNOSIS — H60503 Unspecified acute noninfective otitis externa, bilateral: Secondary | ICD-10-CM | POA: Diagnosis not present

## 2022-07-22 LAB — HEPATIC FUNCTION PANEL
ALT: 13 U/L (ref 0–35)
AST: 16 U/L (ref 0–37)
Albumin: 4.3 g/dL (ref 3.5–5.2)
Alkaline Phosphatase: 53 U/L (ref 39–117)
Bilirubin, Direct: 0.1 mg/dL (ref 0.0–0.3)
Total Bilirubin: 0.7 mg/dL (ref 0.2–1.2)
Total Protein: 7.4 g/dL (ref 6.0–8.3)

## 2022-07-22 LAB — BASIC METABOLIC PANEL
BUN: 13 mg/dL (ref 6–23)
CO2: 25 mEq/L (ref 19–32)
Calcium: 9.3 mg/dL (ref 8.4–10.5)
Chloride: 104 mEq/L (ref 96–112)
Creatinine, Ser: 1.06 mg/dL (ref 0.40–1.20)
GFR: 53.41 mL/min — ABNORMAL LOW (ref >60.00)
Glucose, Bld: 98 mg/dL (ref 70–99)
Potassium: 3.7 mEq/L (ref 3.5–5.1)
Sodium: 140 mEq/L (ref 135–145)

## 2022-07-22 LAB — LIPID PANEL
Cholesterol: 167 mg/dL (ref 0–200)
HDL: 63.4 mg/dL (ref >39.00)
NonHDL: 103.89
Total CHOL/HDL Ratio: 3
Triglycerides: 204 mg/dL — ABNORMAL HIGH (ref 0.0–149.0)
VLDL: 40.8 mg/dL — ABNORMAL HIGH (ref 0.0–40.0)

## 2022-07-22 LAB — TSH: TSH: 1.32 u[IU]/mL (ref 0.35–5.50)

## 2022-07-22 LAB — LDL CHOLESTEROL, DIRECT: Direct LDL: 81 mg/dL

## 2022-07-22 MED ORDER — NEOMYCIN-POLYMYXIN-HC 3.5-10000-1 OT SOLN
3.0000 [drp] | Freq: Three times a day (TID) | OTIC | 0 refills | Status: DC | PRN
Start: 2022-07-22 — End: 2022-12-22

## 2022-07-22 MED ORDER — CEFDINIR 300 MG PO CAPS: 300.0000 mg | ORAL_CAPSULE | Freq: Two times a day (BID) | ORAL | 0 refills | Status: DC

## 2022-07-22 NOTE — Progress Notes (Unsigned)
Patient ID: Jill Torres, female   DOB: 13-Oct-1951, 71 y.o.   MRN: [id number]   Subjective:    Patient ID: Jill Torres, female    DOB: 07/26/1951, 71 y.o.   MRN: [id number]   Patient here for a scheduled follow up.   Chief Complaint  Patient presents with   Hypertension   Hyperlipidemia   Sinusitis   Ear Pain   .   HPI Reports she has been doing relatively well.  Has had issues with increased sinus pressure, congestion and green mucus production from her now.  Right ear pain.  Increased drainage.  No sore throat. Some cough from the drainage.  No chest pain or sob.  No vomiting.  Some nausea from  increased drainage.  No diarrhea.  No left lower quadrant pain.  No abdominal pain.  Bowels stable.  Blood pressure doing well  - 086V systolic on outside readings.     Past Medical History:  Diagnosis Date   Allergy    Arthritis    Depression    Diverticulosis of colon (without mention of hemorrhage)    GERD (gastroesophageal reflux disease)    History of chicken pox    History of shingles may 2013   HLD (hyperlipidemia)    HTN (hypertension)    Irritable bowel syndrome    Unspecified hypothyroidism    Past Surgical History:  Procedure Laterality Date   ABDOMINAL HYSTERECTOMY  1983   due to heavy bleeding & ovary cyst   APPENDECTOMY  1974   BLADDER DIVERTICULECTOMY  1990   CESAREAN SECTION     x 2   CHOLECYSTECTOMY     DILATION AND CURETTAGE OF UTERUS     x 2   DIRECT LARYNGOSCOPY Right 06/11/2015   Procedure: suspension microdirect laryngoscopy with biopsy of right tongue base;  Surgeon: Carloyn Manner, MD;  Location: [facility];  Service: ENT;  Laterality: Right;   [place]   cyst on ovary and heavy bleeding    Family History  Problem Relation Age of Onset   Arthritis Mother    Hypertension Mother    Hyperlipidemia Mother    Atrial fibrillation Mother    Heart disease Mother    Diabetes Mother    Colon polyps  Mother    Hyperlipidemia Father    Heart disease Father        s/p CABG   Stroke Father    Rheumatic fever Sister        s/p valve replacement   Deep vein thrombosis Brother        after immobilization    Colon polyps Brother    Heart disease Brother    Diabetes Maternal Grandfather    Heart disease Brother    Diabetes Maternal Aunt    Diabetes Maternal Uncle    Breast cancer Neg Hx    Colon cancer Neg Hx    Esophageal cancer Neg Hx    Rectal cancer Neg Hx    Stomach cancer Neg Hx    Social History   Socioeconomic History   Marital status: Single    Spouse name: Not on file   Number of children: 2   Years of education: Not on file   Highest education level: Not on file  Occupational History   Occupation: retired    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   Tobacco Use   Smoking status: Never   Smokeless tobacco: Never  Substance and Sexual  Activity   Alcohol use: No    Alcohol/week: 0.0 standard drinks of alcohol   Drug use: No   Sexual activity: Not on file  Other Topics Concern   Not on file  Social History Narrative   No regular exercise   Lives in noisy environment- very stressful    Daily caffeine use    Social Determinants of Health   Financial Resource Strain: Low Risk  (02/10/2022)   Overall Financial Resource Strain (CARDIA)    Difficulty of Paying Living Expenses: Not hard at all  Food Insecurity: No Food Insecurity (02/10/2022)   Hunger Vital Sign    Worried About Running Out of Food in the Last Year: Never true    Ran Out of Food in the Last Year: Never true  Transportation Needs: No Transportation Needs (02/10/2022)   PRAPARE - Hydrologist (Medical): No    Lack of Transportation (Non-Medical): No  Physical Activity: Insufficiently Active (02/10/2022)   Exercise Vital Sign    Days of Exercise per Week: 4 days    Minutes of Exercise per Session: 20 min  Stress: No Stress Concern Present (02/10/2022)   [place]    Feeling of Stress : Not at all  Social Connections: Socially Integrated (02/10/2022)   Social Connection and Isolation Panel [NHANES]    Frequency of Communication with Friends and Family: More than three times a week    Frequency of Social Gatherings with Friends and Family: More than three times a week    Attends Religious Services: 1 to 4 times per year    Active Member of [employer] or Organizations: Yes    Attends Music therapist: Not on file    Marital Status: Married     Review of Systems     Objective:     BP 134/80 (BP Location: Left Arm, Patient Position: Sitting, Cuff Size: Small)   Pulse 67   Temp 98.1 F (36.7 C) (Temporal)   Resp 15   Ht '5\' 3"'$  (1.6 m)   Wt 175 lb 3.2 oz (79.5 kg)   LMP 12/12/1981   SpO2 99%   BMI 31.04 kg/m  Wt Readings from Last 3 Encounters:  07/22/22 175 lb 3.2 oz (79.5 kg)  03/22/22 175 lb 12.8 oz (79.7 kg)  02/10/22 170 lb (77.1 kg)    Physical Exam   Outpatient Encounter Medications as of 07/22/2022  Medication Sig   amLODipine (NORVASC) 2.5 MG tablet TAKE 1 TABLET BY MOUTH EVERY DAY   Ascorbic Acid (VITAMIN C) 1000 MG tablet Take 1,000 mg by mouth daily.   Azelastine HCl 137 MCG/SPRAY SOLN PLACE 1 SPRAY INTO BOTH NOSTRILS 2 (TWO) TIMES DAILY. USE IN EACH NOSTRIL AS DIRECTED   bifidobacterium infantis (ALIGN) capsule Take 1 capsule by mouth daily.   calcium-vitamin D (OSCAL WITH D 500-200) 500-200 MG-UNIT per tablet Take 1 tablet by mouth daily.   Coenzyme Q10 (COQ10 PO) Take by mouth daily.   fish oil-omega-3 fatty acids 1000 MG capsule Take 2 g by mouth daily.   Fluticasone Furoate (FLONASE SENSIMIST NA) Place into the nose.   multivitamin (THERAGRAN) per tablet Take 1 tablet by mouth daily.   polyethylene glycol powder (GLYCOLAX/MIRALAX) 17 GM/SCOOP powder Take 17 g by mouth as needed.   rosuvastatin (CRESTOR) 20 MG tablet TAKE 1 TABLET BY MOUTH EVERY DAY    neomycin-polymyxin-hydrocortisone (CORTISPORIN) OTIC solution Place 3 drops into both ears 3 (  three) times daily as needed. (Patient not taking: Reported on 07/22/2022)   No facility-administered encounter medications on file as of 07/22/2022.     Lab Results  Component Value Date   WBC 7.1 03/22/2022   HGB 13.8 03/22/2022   HCT 41.0 03/22/2022   PLT 360.0 03/22/2022   GLUCOSE 91 04/22/2022   CHOL 175 03/22/2022   TRIG 163.0 (H) 03/22/2022   HDL 70.00 03/22/2022   LDLDIRECT 155.8 06/27/2013   LDLCALC 73 03/22/2022   ALT 19 03/22/2022   AST 19 03/22/2022   NA 138 04/22/2022   K 4.0 04/22/2022   CL 103 04/22/2022   CREATININE 1.05 04/22/2022   BUN 18 04/22/2022   CO2 26 04/22/2022   TSH 1.26 07/15/2021    MM 3D SCREEN BREAST BILATERAL  Result Date: 11/12/2021 CLINICAL DATA:  Screening. EXAM: DIGITAL SCREENING BILATERAL MAMMOGRAM WITH TOMOSYNTHESIS AND CAD TECHNIQUE: Bilateral screening digital craniocaudal and mediolateral oblique mammograms were obtained. Bilateral screening digital breast tomosynthesis was performed. The images were evaluated with computer-aided detection. COMPARISON:  Previous exam(s). ACR Breast Density Category b: There are scattered areas of fibroglandular density. FINDINGS: There are no findings suspicious for malignancy. IMPRESSION: No mammographic evidence of malignancy. A result letter of this screening mammogram will be mailed directly to the patient. RECOMMENDATION: Screening mammogram in one year. (Code:SM-B-01Y) BI-RADS CATEGORY  1: Negative. Electronically Signed   By: Ammie Ferrier M.D.   On: 11/12/2021 06:28      Assessment & Plan:   Problem List Items Addressed This Visit     HTN (hypertension) - Primary   Relevant Orders   Basic Metabolic Panel (BMET)   Hypercholesterolemia   Relevant Orders   Lipid Profile   Hepatic function panel   Hypothyroidism   Relevant Orders   TSH     [name], MD

## 2022-07-22 NOTE — Patient Instructions (Signed)
Nasacort nasal spray - 2 sprays each nostril one time per day.   Can continue mucinex.

## 2022-07-23 LAB — NOVEL CORONAVIRUS, NAA: SARS-CoV-2, NAA: NOT DETECTED

## 2022-07-24 ENCOUNTER — Encounter: Payer: Self-pay | Admitting: Internal Medicine

## 2022-07-24 NOTE — Assessment & Plan Note (Signed)
Mammogram 11/12/21  Birads I.

## 2022-07-24 NOTE — Assessment & Plan Note (Signed)
On thyroid replacement.  Follow tsh.  

## 2022-07-24 NOTE — Assessment & Plan Note (Signed)
Continue amlodipine.  Follow pressures.  Follow metabolic panel.   

## 2022-07-24 NOTE — Assessment & Plan Note (Signed)
Continues on zyrtec and astelin.  Appears to have progressed.  Now with sinus symptoms - appears to be c/w sinusitis.  Treat with omnicef as directed.  astelin and steroid nasal spray as directed.  Given symptoms, will swab for covid.  Discussed quarantine guidelines.

## 2022-07-24 NOTE — Assessment & Plan Note (Signed)
On crestor.  Low cholesterol diet and exercise.  Follow lipid panel and liver function tests.   

## 2022-07-24 NOTE — Assessment & Plan Note (Signed)
Appears to be doing well.  Follow.   

## 2022-07-24 NOTE — Assessment & Plan Note (Signed)
Left ear - canal - minimal swelling.  Cortisporin otic as directed.  Follow.

## 2022-10-24 ENCOUNTER — Other Ambulatory Visit: Payer: Self-pay | Admitting: Internal Medicine

## 2022-10-24 DIAGNOSIS — R030 Elevated blood-pressure reading, without diagnosis of hypertension: Secondary | ICD-10-CM

## 2022-10-27 ENCOUNTER — Ambulatory Visit (INDEPENDENT_AMBULATORY_CARE_PROVIDER_SITE_OTHER): Payer: Medicare HMO

## 2022-10-27 DIAGNOSIS — Z23 Encounter for immunization: Secondary | ICD-10-CM

## 2022-11-17 ENCOUNTER — Ambulatory Visit
Admission: RE | Admit: 2022-11-17 | Discharge: 2022-11-17 | Disposition: A | Discharge status: Home / Self Care | Payer: Medicare HMO | Source: Ambulatory Visit | Attending: Internal Medicine | Admitting: Internal Medicine

## 2022-11-17 DIAGNOSIS — Z1231 Encounter for screening mammogram for malignant neoplasm of breast: Secondary | ICD-10-CM | POA: Diagnosis not present

## 2022-12-02 ENCOUNTER — Ambulatory Visit: Payer: Medicare HMO | Admitting: Internal Medicine

## 2022-12-08 ENCOUNTER — Ambulatory Visit: Payer: Medicare HMO | Admitting: Internal Medicine

## 2022-12-22 ENCOUNTER — Encounter: Payer: Self-pay | Admitting: Internal Medicine

## 2022-12-22 ENCOUNTER — Ambulatory Visit (INDEPENDENT_AMBULATORY_CARE_PROVIDER_SITE_OTHER): Payer: Medicare HMO | Admitting: Internal Medicine

## 2022-12-22 VITALS — BP 136/80 | HR 59 | Temp 98.2°F | Resp 16 | Ht 63.0 in | Wt 176.6 lb

## 2022-12-22 DIAGNOSIS — I10 Essential (primary) hypertension: Secondary | ICD-10-CM

## 2022-12-22 DIAGNOSIS — M542 Cervicalgia: Secondary | ICD-10-CM

## 2022-12-22 DIAGNOSIS — E039 Hypothyroidism, unspecified: Secondary | ICD-10-CM | POA: Diagnosis not present

## 2022-12-22 DIAGNOSIS — R030 Elevated blood-pressure reading, without diagnosis of hypertension: Secondary | ICD-10-CM | POA: Diagnosis not present

## 2022-12-22 DIAGNOSIS — E7800 Pure hypercholesterolemia, unspecified: Secondary | ICD-10-CM | POA: Diagnosis not present

## 2022-12-22 LAB — LIPID PANEL
Cholesterol: 147 mg/dL (ref 0–200)
HDL: 65.4 mg/dL (ref >39.00)
LDL Cholesterol: 57 mg/dL (ref 0–99)
NonHDL: 81.68
Total CHOL/HDL Ratio: 2
Triglycerides: 123 mg/dL (ref 0.0–149.0)
VLDL: 24.6 mg/dL (ref 0.0–40.0)

## 2022-12-22 LAB — HEPATIC FUNCTION PANEL
ALT: 12 U/L (ref 0–35)
AST: 14 U/L (ref 0–37)
Albumin: 4.5 g/dL (ref 3.5–5.2)
Alkaline Phosphatase: 58 U/L (ref 39–117)
Bilirubin, Direct: 0.2 mg/dL (ref 0.0–0.3)
Total Bilirubin: 0.9 mg/dL (ref 0.2–1.2)
Total Protein: 7.6 g/dL (ref 6.0–8.3)

## 2022-12-22 LAB — BASIC METABOLIC PANEL
BUN: 10 mg/dL (ref 6–23)
CO2: 27 mEq/L (ref 19–32)
Calcium: 9.5 mg/dL (ref 8.4–10.5)
Chloride: 105 mEq/L (ref 96–112)
Creatinine, Ser: 1.05 mg/dL (ref 0.40–1.20)
GFR: 53.87 mL/min — ABNORMAL LOW (ref >60.00)
Glucose, Bld: 85 mg/dL (ref 70–99)
Potassium: 3.9 mEq/L (ref 3.5–5.1)
Sodium: 140 mEq/L (ref 135–145)

## 2022-12-22 MED ORDER — ROSUVASTATIN CALCIUM 20 MG PO TABS: 20.0000 mg | ORAL_TABLET | Freq: Every day | ORAL | 1 refills | Status: DC

## 2022-12-22 MED ORDER — AMLODIPINE BESYLATE 2.5 MG PO TABS: 2.5000 mg | ORAL_TABLET | Freq: Every day | ORAL | 1 refills | Status: DC

## 2022-12-22 MED ORDER — TIZANIDINE HCL 2 MG PO TABS: 2.0000 mg | ORAL_TABLET | Freq: Every evening | ORAL | 0 refills | Status: DC | PRN

## 2022-12-22 NOTE — Progress Notes (Signed)
Subjective:    Patient ID: Jill Torres, female    DOB: 23-Mar-1951, 72 y.o.   MRN: [id number]  Patient here for  Chief Complaint  Patient presents with   Medical Management of Chronic Issues   Hypertension   Hyperlipidemia   Hypothyroidism    HPI Here to follow up regarding hypercholesterolemia and hypertension.  Reports she has been doing relatively well.  Tries to stay active.  No chest pain or sob reported.  No increased cough or congestion.  No acid relfux reported.  No abdominal pain.  Bowels moving.  Does report increased neck pain - left posterior/lateral neck and posterior left shoulder. Some limited rom.  States started a couple of days ago.  Noticed after turning a mattress around on the bed.  Has been taking dual action advil and TENS unit.  Also using heat and ice.  Still with discomfort.  Reports blood pressures at home averaging 120s/70s.  Her cuff does correlate.  Checked today.     Past Medical History:  Diagnosis Date   Allergy    Arthritis    Depression    Diverticulosis of colon (without mention of hemorrhage)    GERD (gastroesophageal reflux disease)    History of chicken pox    History of shingles may 2013   HLD (hyperlipidemia)    HTN (hypertension)    Irritable bowel syndrome    Unspecified hypothyroidism    Past Surgical History:  Procedure Laterality Date   ABDOMINAL HYSTERECTOMY  1983   due to heavy bleeding & ovary cyst   APPENDECTOMY  1974   BLADDER DIVERTICULECTOMY  1990   CESAREAN SECTION     x 2   CHOLECYSTECTOMY     DILATION AND CURETTAGE OF UTERUS     x 2   DIRECT LARYNGOSCOPY Right 06/11/2015   Procedure: suspension microdirect laryngoscopy with biopsy of right tongue base;  Surgeon: Carloyn Manner, MD;  Location: [facility] OR[facility];  Service: ENT;  Laterality: Right;   [place]   cyst on ovary and heavy bleeding    Family History  Problem Relation Age of Onset   Arthritis Mother     Hypertension Mother    Hyperlipidemia Mother    Atrial fibrillation Mother    Heart disease Mother    Diabetes Mother    Colon polyps Mother    Hyperlipidemia Father    Heart disease Father        s/p CABG   Stroke Father    Rheumatic fever Sister        s/p valve replacement   Deep vein thrombosis Brother        after immobilization    Colon polyps Brother    Heart disease Brother    Diabetes Maternal Grandfather    Heart disease Brother    Diabetes Maternal Aunt    Diabetes Maternal Uncle    Breast cancer Neg Hx    Colon cancer Neg Hx    Esophageal cancer Neg Hx    Rectal cancer Neg Hx    Stomach cancer Neg Hx    Social History   Socioeconomic History   Marital status: Single    Spouse name: Not on file   Number of children: 2   Years of education: Not on file   Highest education level: Not on file  Occupational History   Occupation: retired    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   Tobacco Use  Smoking status: Never   Smokeless tobacco: Never  Substance and Sexual Activity   Alcohol use: No    Alcohol/week: 0.0 standard drinks of alcohol   Drug use: No   Sexual activity: Not on file  Other Topics Concern   Not on file  Social History Narrative   No regular exercise   Lives in noisy environment- very stressful    Daily caffeine use    Social Determinants of Health   Financial Resource Strain: Low Risk  (02/10/2022)   Overall Financial Resource Strain (CARDIA)    Difficulty of Paying Living Expenses: Not hard at all  Food Insecurity: No Food Insecurity (02/10/2022)   Hunger Vital Sign    Worried About Running Out of Food in the Last Year: Never true    Ran Out of Food in the Last Year: Never true  Transportation Needs: No Transportation Needs (02/10/2022)   PRAPARE - Hydrologist (Medical): No    Lack of Transportation (Non-Medical): No  Physical Activity: Insufficiently Active (02/10/2022)   Exercise Vital Sign    Days of Exercise  per Week: 4 days    Minutes of Exercise per Session: 20 min  Stress: No Stress Concern Present (02/10/2022)   [place]    Feeling of Stress : Not at all  Social Connections: [place] (02/10/2022)   Social Connection and Isolation Panel [NHANES]    Frequency of Communication with Friends and Family: More than three times a week    Frequency of Social Gatherings with Friends and Family: More than three times a week    Attends Religious Services: 1 to 4 times per year    Active Member of [employer] or Organizations: Yes    Attends Archivist Meetings: Not on file    Marital Status: Married     Review of Systems  Constitutional:  Negative for appetite change and unexpected weight change.  HENT:  Negative for congestion and sinus pressure.   Respiratory:  Negative for cough, chest tightness and shortness of breath.   Cardiovascular:  Negative for chest pain, palpitations and leg swelling.  Gastrointestinal:  Negative for abdominal pain, diarrhea, nausea and vomiting.  Genitourinary:  Negative for difficulty urinating and dyspareunia.  Musculoskeletal:  Positive for neck pain. Negative for joint swelling and myalgias.  Skin:  Negative for color change and rash.  Neurological:  Negative for dizziness and headaches.  Psychiatric/Behavioral:  Negative for agitation and dysphoric mood.        Objective:     BP 136/80   Pulse (!) 59   Temp 98.2 F (36.8 C) (Temporal)   Resp 16   Ht '5\' 3"'$  (1.6 m)   Wt 176 lb 9.6 oz (80.1 kg)   LMP 12/12/1981   SpO2 100%   BMI 31.28 kg/m  Wt Readings from Last 3 Encounters:  12/22/22 176 lb 9.6 oz (80.1 kg)  07/22/22 175 lb 3.2 oz (79.5 kg)  03/22/22 175 lb 12.8 oz (79.7 kg)    Physical Exam Vitals reviewed.  Constitutional:      General: She is not in acute distress.    Appearance: Normal appearance.  HENT:     Head: Normocephalic and atraumatic.     Right  Ear: External ear normal.     Left Ear: External ear normal.  Eyes:     General: No scleral icterus.       Right eye: No discharge.  Left eye: No discharge.     Conjunctiva/sclera: Conjunctivae normal.  Neck:     Thyroid: No thyromegaly.  Cardiovascular:     Rate and Rhythm: Normal rate and regular rhythm.  Pulmonary:     Effort: No respiratory distress.     Breath sounds: Normal breath sounds. No wheezing.  Abdominal:     General: Bowel sounds are normal.     Palpations: Abdomen is soft.     Tenderness: There is no abdominal tenderness.  Musculoskeletal:        General: No swelling or tenderness.     Cervical back: Neck supple. No tenderness.     Comments: Limited rom - neck - worse with looking to the right.    Lymphadenopathy:     Cervical: No cervical adenopathy.  Skin:    Findings: No erythema or rash.  Neurological:     Mental Status: She is alert.  Psychiatric:        Mood and Affect: Mood normal.        Behavior: Behavior normal.      Outpatient Encounter Medications as of 12/22/2022  Medication Sig   Ascorbic Acid (VITAMIN C) 1000 MG tablet Take 1,000 mg by mouth daily.   Azelastine HCl 137 MCG/SPRAY SOLN PLACE 1 SPRAY INTO BOTH NOSTRILS 2 (TWO) TIMES DAILY. USE IN EACH NOSTRIL AS DIRECTED   bifidobacterium infantis (ALIGN) capsule Take 1 capsule by mouth daily.   calcium-vitamin D (OSCAL WITH D 500-200) 500-200 MG-UNIT per tablet Take 1 tablet by mouth daily.   Coenzyme Q10 (COQ10 PO) Take by mouth daily.   fish oil-omega-3 fatty acids 1000 MG capsule Take 2 g by mouth daily.   Fluticasone Furoate (FLONASE SENSIMIST NA) Place into the nose.   multivitamin (THERAGRAN) per tablet Take 1 tablet by mouth daily.   polyethylene glycol powder (GLYCOLAX/MIRALAX) 17 GM/SCOOP powder Take 17 g by mouth as needed.   tiZANidine (ZANAFLEX) 2 MG tablet Take 1 tablet (2 mg total) by mouth at bedtime as needed for muscle spasms.   [DISCONTINUED] amLODipine (NORVASC) 2.5  MG tablet TAKE 1 TABLET BY MOUTH EVERY DAY   [DISCONTINUED] rosuvastatin (CRESTOR) 20 MG tablet TAKE 1 TABLET BY MOUTH EVERY DAY   amLODipine (NORVASC) 2.5 MG tablet Take 1 tablet (2.5 mg total) by mouth daily.   rosuvastatin (CRESTOR) 20 MG tablet Take 1 tablet (20 mg total) by mouth daily.   [DISCONTINUED] cefdinir (OMNICEF) 300 MG capsule Take 1 capsule (300 mg total) by mouth 2 (two) times daily.   [DISCONTINUED] neomycin-polymyxin-hydrocortisone (CORTISPORIN) OTIC solution Place 3 drops into the right ear 3 (three) times daily as needed.   No facility-administered encounter medications on file as of 12/22/2022.     Lab Results  Component Value Date   WBC 7.1 03/22/2022   HGB 13.8 03/22/2022   HCT 41.0 03/22/2022   PLT 360.0 03/22/2022   GLUCOSE 85 12/22/2022   CHOL 147 12/22/2022   TRIG 123.0 12/22/2022   HDL 65.40 12/22/2022   LDLDIRECT 81.0 07/22/2022   LDLCALC 57 12/22/2022   ALT 12 12/22/2022   AST 14 12/22/2022   NA 140 12/22/2022   K 3.9 12/22/2022   CL 105 12/22/2022   CREATININE 1.05 12/22/2022   BUN 10 12/22/2022   CO2 27 12/22/2022   TSH 1.32 07/22/2022    MM 3D SCREEN BREAST BILATERAL  Result Date: 11/17/2022 CLINICAL DATA:  Screening. EXAM: DIGITAL SCREENING BILATERAL MAMMOGRAM WITH TOMOSYNTHESIS AND CAD TECHNIQUE: Bilateral screening digital craniocaudal and mediolateral  oblique mammograms were obtained. Bilateral screening digital breast tomosynthesis was performed. The images were evaluated with computer-aided detection. COMPARISON:  Previous exam(s). ACR Breast Density Category c: The breast tissue is heterogeneously dense, which may obscure small masses. FINDINGS: There are no findings suspicious for malignancy. IMPRESSION: No mammographic evidence of malignancy. A result letter of this screening mammogram will be mailed directly to the patient. RECOMMENDATION: Screening mammogram in one year. (Code:SM-B-01Y) BI-RADS CATEGORY  1: Negative. Electronically Signed    By: Lovey Newcomer M.D.   On: 11/17/2022 11:37       Assessment & Plan:  Hypercholesterolemia Assessment & Plan: On crestor.  Low cholesterol diet and exercise.  Follow lipid panel and liver function tests.   Orders: -     Hepatic function panel -     Lipid panel  Hypothyroidism, unspecified type Assessment & Plan: On thyroid replacement.  Follow tsh.    Hypertension, unspecified type Assessment & Plan: Continue amlodipine.  Follow pressures.  Follow metabolic panel. Her cuff correlates.  Outside blood pressure readings averaging 120s/70s    Orders: -     Basic metabolic panel  Elevated blood pressure reading -     amLODIPine Besylate; Take 1 tablet (2.5 mg total) by mouth daily.  Dispense: 90 tablet; Refill: 1  Neck pain Assessment & Plan: Increased neck pain and limited rom as outlined.  Continue heat/ice.  Gentle use of antiinflammatory.  Add tizanidine.  Follow.  Call with update.    Other orders -     Rosuvastatin Calcium; Take 1 tablet (20 mg total) by mouth daily.  Dispense: 90 tablet; Refill: 1 -     tiZANidine HCl; Take 1 tablet (2 mg total) by mouth at bedtime as needed for muscle spasms.  Dispense: 20 tablet; Refill: 0     [name], MD

## 2022-12-22 NOTE — Assessment & Plan Note (Signed)
On crestor.  Low cholesterol diet and exercise.  Follow lipid panel and liver function tests.   

## 2022-12-22 NOTE — Assessment & Plan Note (Signed)
Increased neck pain and limited rom as outlined.  Continue heat/ice.  Gentle use of antiinflammatory.  Add tizanidine.  Follow.  Call with update.

## 2022-12-22 NOTE — Assessment & Plan Note (Signed)
On thyroid replacement.  Follow tsh.  

## 2022-12-22 NOTE — Assessment & Plan Note (Signed)
Continue amlodipine.  Follow pressures.  Follow metabolic panel. Her cuff correlates.  Outside blood pressure readings averaging 120s/70s

## 2023-01-11 ENCOUNTER — Telehealth: Payer: Self-pay | Admitting: Internal Medicine

## 2023-01-11 DIAGNOSIS — R350 Frequency of micturition: Secondary | ICD-10-CM | POA: Diagnosis not present

## 2023-01-11 NOTE — Telephone Encounter (Signed)
Patient complaining of 10/10 abdominal pain. Had good size bowel movement today with no relief. Urine has strong odor. Advised given her pain is a 10/10 she needs to be evaluated today to confirm nothing acute going on then we can f/u after. Patient agreed and is on her way to be evaluated.

## 2023-01-11 NOTE — Telephone Encounter (Signed)
Patient called and said she was suppose to call Dr Nicki Reaper when the next time she had pain in her lower abdomin left side to the middle of back. Yesterday she urinated a lot, today her urine  has a strong odor, her stools are very hard.

## 2023-01-30 DIAGNOSIS — R399 Unspecified symptoms and signs involving the genitourinary system: Secondary | ICD-10-CM | POA: Diagnosis not present

## 2023-01-30 DIAGNOSIS — R300 Dysuria: Secondary | ICD-10-CM | POA: Diagnosis not present

## 2023-02-03 ENCOUNTER — Telehealth: Payer: Self-pay | Admitting: Internal Medicine

## 2023-02-03 NOTE — Telephone Encounter (Signed)
Patient called and wanted Dr Nicki Reaper to look into what [facility] is treating her for and what is her opinion.

## 2023-02-03 NOTE — Telephone Encounter (Signed)
S/w pt - went to [facility] for "uti" twice. Has taken 10 days of Cipro Now on Augmentin since Monday. Still having pressure, lower abd pain and back pain. Wants Xray  Pt advised Dr Nicki Reaper not in office. Pt offered appt with another physician here for eval, declined. Stated has paid 2 copays already, doesn't want to pay for another one. Just wants XR.  Pt stated she will call [facility] and see if they can order for her.

## 2023-02-03 NOTE — Telephone Encounter (Signed)
Pt returned Jill Torres call. Transferred.

## 2023-02-03 NOTE — Telephone Encounter (Signed)
Attempted to call pt to clarify  No ans, VM full

## 2023-02-03 NOTE — Telephone Encounter (Signed)
Called patient to schedule Medicare Annual Wellness Visit (AWV). No voicemail available to leave a message.  Last date of AWV: 02/10/2022   Please schedule an appointment at any time with Denisa, [place].  If any questions, please contact me at [phone].   Thank you,  [place] Direct dial  [phone]

## 2023-02-06 NOTE — Telephone Encounter (Signed)
S/w pt - declined office visit again. Stated Saturday pain resolved. No pain Sunday, No pain so far today. Doesn't think needs eval.  Pt advised if any sx return, call office will get her set up for an appointment. Pt agreed.

## 2023-02-06 NOTE — Telephone Encounter (Signed)
Reviewed information from [place].  Was treated for UTI, but it looks like urine culture was negative.  If having persistent abdominal pain, she does need to be reevaluated.  May need CT scan, but would need to be evaluated to know what test to order - based on clinical exam findings and symptoms.

## 2023-02-09 NOTE — Telephone Encounter (Signed)
Pt called stating she still has the fullness in her stomach and back and want the provider to call her

## 2023-02-10 NOTE — Telephone Encounter (Signed)
Patient scheduled for work in Wednesday 3/6 at 1:30.

## 2023-02-10 NOTE — Telephone Encounter (Signed)
Discussed with patient and advised recommendation from earlier in the week. She is still having the abdominal fullness and back pain. No nausea, vomiting, fever, chills, bleeding, etc. Agreed to have evaluation. Explained given it is Friday we may not be able to work her in but she does need to be evaluated before the weekend to confirm nothing acute going on. Pt stated she does have an appt with GI but this is not until June 18th. Advised I would check schedules to see if any appointments available or help her get set up with urgent care. Pt stated she could do whatever we thought was necessary.

## 2023-02-10 NOTE — Telephone Encounter (Signed)
Pt prefers to wait to see Dr Nicki Reaper but agreed to be evaluated more acutely sooner than 3/6 if symptoms worsen, change or new symptoms develop.

## 2023-02-10 NOTE — Telephone Encounter (Signed)
Given schedule today and work in - I am unable to see her today, but will be happy to work her in next week, but if she is having increased pain, she needs to be reevaluated more urgently.  May need scan, etc - but unable to determine without evaluating.

## 2023-02-15 ENCOUNTER — Encounter: Payer: Self-pay | Admitting: Internal Medicine

## 2023-02-15 ENCOUNTER — Ambulatory Visit (INDEPENDENT_AMBULATORY_CARE_PROVIDER_SITE_OTHER): Payer: Medicare HMO | Admitting: Internal Medicine

## 2023-02-15 VITALS — BP 110/70 | HR 90 | Temp 98.0°F | Resp 16 | Ht 63.0 in | Wt 174.2 lb

## 2023-02-15 DIAGNOSIS — I10 Essential (primary) hypertension: Secondary | ICD-10-CM | POA: Diagnosis not present

## 2023-02-15 DIAGNOSIS — R69 Illness, unspecified: Secondary | ICD-10-CM | POA: Diagnosis not present

## 2023-02-15 DIAGNOSIS — R1032 Left lower quadrant pain: Secondary | ICD-10-CM

## 2023-02-15 DIAGNOSIS — E039 Hypothyroidism, unspecified: Secondary | ICD-10-CM

## 2023-02-15 DIAGNOSIS — Z87448 Personal history of other diseases of urinary system: Secondary | ICD-10-CM | POA: Diagnosis not present

## 2023-02-15 DIAGNOSIS — E7800 Pure hypercholesterolemia, unspecified: Secondary | ICD-10-CM | POA: Diagnosis not present

## 2023-02-15 DIAGNOSIS — Z86010 Personal history of colonic polyps: Secondary | ICD-10-CM | POA: Diagnosis not present

## 2023-02-15 DIAGNOSIS — F419 Anxiety disorder, unspecified: Secondary | ICD-10-CM

## 2023-02-15 DIAGNOSIS — K219 Gastro-esophageal reflux disease without esophagitis: Secondary | ICD-10-CM | POA: Diagnosis not present

## 2023-02-15 NOTE — Progress Notes (Addendum)
Subjective:    Patient ID: Jill Torres, female    DOB: November 24, 1951, 72 y.o.   MRN: [id number]  Patient here for  Chief Complaint  Patient presents with   Bloated   Abdominal Pain    HPI Work in for persistent abdominal pain and bloating.  Reports that symptoms started 01/09/23.  Woke with some discomfort.  Some burning sensation.  Took tylenol.  Was seen 01/11/23 - acute care - urinary urgency, suprapubic pressure and low back pain, along with lower abdominal pressure.  Treated for UTI. Had persistent symptoms, reevaluated urgent care.  Treated with a different abx - to cover UTI and possible diverticulitis.  Urine culture negative.  Has completed 10 days of cipro and augmentin. Comes in today - persistent abdominal pain and bloating.  Bowel change - will have a formed stool and then loose stools follow.  When eats - feels bloated.  Increased burping.      Past Medical History:  Diagnosis Date   Allergy    Arthritis    Depression    Diverticulosis of colon (without mention of hemorrhage)    GERD (gastroesophageal reflux disease)    History of chicken pox    History of shingles may 2013   HLD (hyperlipidemia)    HTN (hypertension)    Irritable bowel syndrome    Unspecified hypothyroidism    Past Surgical History:  Procedure Laterality Date   ABDOMINAL HYSTERECTOMY  1983   due to heavy bleeding & ovary cyst   APPENDECTOMY  1974   BLADDER DIVERTICULECTOMY  1990   CESAREAN SECTION     x 2   CHOLECYSTECTOMY     DILATION AND CURETTAGE OF UTERUS     x 2   DIRECT LARYNGOSCOPY Right 06/11/2015   Procedure: suspension microdirect laryngoscopy with biopsy of right tongue base;  Surgeon: Carloyn Manner, MD;  Location: [facility];  Service: ENT;  Laterality: Right;   [place]   cyst on ovary and heavy bleeding    Family History  Problem Relation Age of Onset   Arthritis Mother    Hypertension Mother    Hyperlipidemia Mother     Atrial fibrillation Mother    Heart disease Mother    Diabetes Mother    Colon polyps Mother    Hyperlipidemia Father    Heart disease Father        s/p CABG   Stroke Father    Rheumatic fever Sister        s/p valve replacement   Deep vein thrombosis Brother        after immobilization    Colon polyps Brother    Heart disease Brother    Diabetes Maternal Grandfather    Heart disease Brother    Diabetes Maternal Aunt    Diabetes Maternal Uncle    Breast cancer Neg Hx    Colon cancer Neg Hx    Esophageal cancer Neg Hx    Rectal cancer Neg Hx    Stomach cancer Neg Hx    Social History   Socioeconomic History   Marital status: Single    Spouse name: Not on file   Number of children: 2   Years of education: Not on file   Highest education level: Not on file  Occupational History   Occupation: retired    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   Tobacco Use   Smoking status: Never   Smokeless tobacco: Never  Substance  and Sexual Activity   Alcohol use: No    Alcohol/week: 0.0 standard drinks of alcohol   Drug use: No   Sexual activity: Not on file  Other Topics Concern   Not on file  Social History Narrative   No regular exercise   Lives in noisy environment- very stressful    Daily caffeine use    Social Determinants of Health   Financial Resource Strain: Low Risk  (02/10/2022)   Overall Financial Resource Strain (CARDIA)    Difficulty of Paying Living Expenses: Not hard at all  Food Insecurity: No Food Insecurity (02/10/2022)   Hunger Vital Sign    Worried About Running Out of Food in the Last Year: Never true    Ran Out of Food in the Last Year: Never true  Transportation Needs: No Transportation Needs (02/10/2022)   PRAPARE - Hydrologist (Medical): No    Lack of Transportation (Non-Medical): No  Physical Activity: Insufficiently Active (02/10/2022)   Exercise Vital Sign    Days of Exercise per Week: 4 days    Minutes of Exercise per  Session: 20 min  Stress: No Stress Concern Present (02/10/2022)   [place]    Feeling of Stress : Not at all  Social Connections: [place] (02/10/2022)   Social Connection and Isolation Panel [NHANES]    Frequency of Communication with Friends and Family: More than three times a week    Frequency of Social Gatherings with Friends and Family: More than three times a week    Attends Religious Services: 1 to 4 times per year    Active Member of [employer] or Organizations: Yes    Attends Archivist Meetings: Not on file    Marital Status: Married     Review of Systems  Constitutional:  Negative for chills and fever.  HENT:  Negative for congestion and sinus pressure.   Respiratory:  Negative for cough, chest tightness and shortness of breath.   Cardiovascular:  Negative for chest pain, palpitations and leg swelling.  Gastrointestinal:  Negative for vomiting.       Abdominal pain and bowel change as outlined.   Genitourinary:  Negative for difficulty urinating.       Some discomfort with urination.   Musculoskeletal:  Negative for joint swelling and myalgias.  Skin:  Negative for color change and rash.  Neurological:  Negative for dizziness and headaches.  Psychiatric/Behavioral:  Negative for agitation and dysphoric mood.        Objective:     BP 110/70   Pulse 90   Temp 98 F (36.7 C)   Resp 16   Ht '5\' 3"'$  (1.6 m)   Wt 174 lb 3.2 oz (79 kg)   LMP 12/12/1981   SpO2 99%   BMI 30.86 kg/m  Wt Readings from Last 3 Encounters:  02/15/23 174 lb 3.2 oz (79 kg)  12/22/22 176 lb 9.6 oz (80.1 kg)  07/22/22 175 lb 3.2 oz (79.5 kg)    Physical Exam Vitals reviewed.  Constitutional:      General: She is not in acute distress.    Appearance: Normal appearance. She is well-developed.  HENT:     Head: Normocephalic and atraumatic.     Right Ear: External ear normal.     Left Ear: External ear  normal.     Nose: Nose normal.     Mouth/Throat:     Pharynx: No oropharyngeal exudate  or posterior oropharyngeal erythema.  Eyes:     General: No scleral icterus.       Right eye: No discharge.        Left eye: No discharge.     Conjunctiva/sclera: Conjunctivae normal.  Neck:     Thyroid: No thyromegaly.  Cardiovascular:     Rate and Rhythm: Normal rate and regular rhythm.  Pulmonary:     Effort: No respiratory distress.     Breath sounds: Normal breath sounds. No wheezing.  Abdominal:     General: Bowel sounds are normal.     Palpations: Abdomen is soft.     Tenderness: There is no abdominal tenderness.     Comments: Increased pain to palpation - more localized LLQ.    Musculoskeletal:        General: No swelling or tenderness.     Cervical back: Neck supple. No tenderness.  Lymphadenopathy:     Cervical: No cervical adenopathy.  Skin:    Findings: No erythema or rash.  Neurological:     Mental Status: She is alert.  Psychiatric:        Mood and Affect: Mood normal.        Behavior: Behavior normal.      Outpatient Encounter Medications as of 02/15/2023  Medication Sig   amLODipine (NORVASC) 2.5 MG tablet Take 1 tablet (2.5 mg total) by mouth daily.   Ascorbic Acid (VITAMIN C) 1000 MG tablet Take 1,000 mg by mouth daily.   Azelastine HCl 137 MCG/SPRAY SOLN PLACE 1 SPRAY INTO BOTH NOSTRILS 2 (TWO) TIMES DAILY. USE IN EACH NOSTRIL AS DIRECTED   bifidobacterium infantis (ALIGN) capsule Take 1 capsule by mouth daily.   calcium-vitamin D (OSCAL WITH D 500-200) 500-200 MG-UNIT per tablet Take 1 tablet by mouth daily.   Coenzyme Q10 (COQ10 PO) Take by mouth daily.   fish oil-omega-3 fatty acids 1000 MG capsule Take 2 g by mouth daily.   Fluticasone Furoate (FLONASE SENSIMIST NA) Place into the nose.   multivitamin (THERAGRAN) per tablet Take 1 tablet by mouth daily.   polyethylene glycol powder (GLYCOLAX/MIRALAX) 17 GM/SCOOP powder Take 17 g by mouth as needed.    rosuvastatin (CRESTOR) 20 MG tablet Take 1 tablet (20 mg total) by mouth daily.   tiZANidine (ZANAFLEX) 2 MG tablet Take 1 tablet (2 mg total) by mouth at bedtime as needed for muscle spasms.   No facility-administered encounter medications on file as of 02/15/2023.     Lab Results  Component Value Date   WBC 10.4 02/15/2023   HGB 13.3 02/15/2023   HCT 39.8 02/15/2023   PLT 346.0 02/15/2023   GLUCOSE 78 02/15/2023   CHOL 147 12/22/2022   TRIG 123.0 12/22/2022   HDL 65.40 12/22/2022   LDLDIRECT 81.0 07/22/2022   LDLCALC 57 12/22/2022   ALT 14 02/15/2023   AST 19 02/15/2023   NA 137 02/15/2023   K 3.7 02/15/2023   CL 101 02/15/2023   CREATININE 1.05 02/15/2023   BUN 16 02/15/2023   CO2 24 02/15/2023   TSH 1.32 07/22/2022    MM 3D SCREEN BREAST BILATERAL  Result Date: 11/17/2022 CLINICAL DATA:  Screening. EXAM: DIGITAL SCREENING BILATERAL MAMMOGRAM WITH TOMOSYNTHESIS AND CAD TECHNIQUE: Bilateral screening digital craniocaudal and mediolateral oblique mammograms were obtained. Bilateral screening digital breast tomosynthesis was performed. The images were evaluated with computer-aided detection. COMPARISON:  Previous exam(s). ACR Breast Density Category c: The breast tissue is heterogeneously dense, which may obscure small masses. FINDINGS: There are no  findings suspicious for malignancy. IMPRESSION: No mammographic evidence of malignancy. A result letter of this screening mammogram will be mailed directly to the patient. RECOMMENDATION: Screening mammogram in one year. (Code:SM-B-01Y) BI-RADS CATEGORY  1: Negative. Electronically Signed   By: Lovey Newcomer M.D.   On: 11/17/2022 11:37       Assessment & Plan:  Left lower quadrant abdominal pain Assessment & Plan: Persistent increased abdominal pain with associated bloating and bowel changes.  Treated for UTI.  Most recent urine - no infection.  Recheck urine today.  Also check labs, including cbc, metabolic panel.lipase and liver  function tests.  Discussed further scanning given persistent pain, bloating and bowel change.  Check CT abdomen and pelvis.  Further w/up pending results.   Orders: -     Lipase -     CBC with Differential/Platelet -     Basic metabolic panel -     Hepatic function panel -     Urinalysis, Routine w reflex microscopic -     Urine Culture  Anxiety Assessment & Plan: Stable.  Follow.    COLONIC POLYPS, HYPERPLASTIC, HX OF Assessment & Plan: 02/22/19 - cecal polyp.  Recommended f/u colonoscopy in 10 years.     Gastroesophageal reflux disease, unspecified whether esophagitis present Assessment & Plan: No acid reflux reported. Follow.    History of hematuria Assessment & Plan: Check urinalysis to confirm clear.     Hypertension, unspecified type Assessment & Plan: Continue amlodipine.  Follow pressures.  Follow metabolic panel. Her cuff correlates.     Hypercholesterolemia Assessment & Plan: On crestor.  Low cholesterol diet and exercise.  Follow lipid panel and liver function tests.    Hypothyroidism, unspecified type Assessment & Plan: On thyroid replacement.  Follow tsh.       [name], MD

## 2023-02-16 ENCOUNTER — Telehealth: Payer: Self-pay

## 2023-02-16 ENCOUNTER — Telehealth: Payer: Self-pay | Admitting: Internal Medicine

## 2023-02-16 ENCOUNTER — Other Ambulatory Visit: Payer: Self-pay | Admitting: Internal Medicine

## 2023-02-16 DIAGNOSIS — R109 Unspecified abdominal pain: Secondary | ICD-10-CM

## 2023-02-16 LAB — CBC WITH DIFFERENTIAL/PLATELET
Basophils Absolute: 0.1 10*3/uL (ref 0.0–0.1)
Basophils Relative: 1.1 % (ref 0.0–3.0)
Eosinophils Absolute: 0 10*3/uL (ref 0.0–0.7)
Eosinophils Relative: 0.5 % (ref 0.0–5.0)
HCT: 39.8 % (ref 36.0–46.0)
Hemoglobin: 13.3 g/dL (ref 12.0–15.0)
Lymphocytes Relative: 31.4 % (ref 12.0–46.0)
Lymphs Abs: 3.3 10*3/uL (ref 0.7–4.0)
MCHC: 33.5 g/dL (ref 30.0–36.0)
MCV: 91.4 fl (ref 78.0–100.0)
Monocytes Absolute: 0.6 10*3/uL (ref 0.1–1.0)
Monocytes Relative: 5.9 % (ref 3.0–12.0)
Neutro Abs: 6.4 10*3/uL (ref 1.4–7.7)
Neutrophils Relative %: 61.1 % (ref 43.0–77.0)
Platelets: 346 10*3/uL (ref 150.0–400.0)
RBC: 4.36 Mil/uL (ref 3.87–5.11)
RDW: 12.8 % (ref 11.5–15.5)
WBC: 10.4 10*3/uL (ref 4.0–10.5)

## 2023-02-16 LAB — URINALYSIS, ROUTINE W REFLEX MICROSCOPIC
Bilirubin Urine: NEGATIVE
Ketones, ur: NEGATIVE
Leukocytes,Ua: NEGATIVE
Nitrite: NEGATIVE
Specific Gravity, Urine: <=1.005 — AB (ref 1.000–1.030)
Total Protein, Urine: NEGATIVE
Urine Glucose: NEGATIVE
Urobilinogen, UA: 0.2 (ref 0.0–1.0)
pH: 6 (ref 5.0–8.0)

## 2023-02-16 LAB — BASIC METABOLIC PANEL
BUN: 16 mg/dL (ref 6–23)
CO2: 24 mEq/L (ref 19–32)
Calcium: 9.8 mg/dL (ref 8.4–10.5)
Chloride: 101 mEq/L (ref 96–112)
Creatinine, Ser: 1.05 mg/dL (ref 0.40–1.20)
GFR: 53.81 mL/min — ABNORMAL LOW (ref >60.00)
Glucose, Bld: 78 mg/dL (ref 70–99)
Potassium: 3.7 mEq/L (ref 3.5–5.1)
Sodium: 137 mEq/L (ref 135–145)

## 2023-02-16 LAB — HEPATIC FUNCTION PANEL
ALT: 14 U/L (ref 0–35)
AST: 19 U/L (ref 0–37)
Albumin: 4.1 g/dL (ref 3.5–5.2)
Alkaline Phosphatase: 52 U/L (ref 39–117)
Bilirubin, Direct: 0.1 mg/dL (ref 0.0–0.3)
Total Bilirubin: 0.9 mg/dL (ref 0.2–1.2)
Total Protein: 7.3 g/dL (ref 6.0–8.3)

## 2023-02-16 LAB — URINE CULTURE
MICRO NUMBER:: 14657063
SPECIMEN QUALITY:: ADEQUATE

## 2023-02-16 LAB — LIPASE: Lipase: 34 U/L (ref 11.0–59.0)

## 2023-02-16 NOTE — Progress Notes (Signed)
Order placed for stat CT

## 2023-02-16 NOTE — Telephone Encounter (Signed)
Pt returned [facility] call. Transferred.

## 2023-02-16 NOTE — Assessment & Plan Note (Addendum)
Persistent increased abdominal pain with associated bloating and bowel changes.  Treated for UTI.  Most recent urine - no infection.  Recheck urine today.  Also check labs, including cbc, metabolic panel.lipase and liver function tests.  Discussed further scanning given persistent pain, bloating and bowel change.  Check CT abdomen and pelvis.  Further w/up pending results.

## 2023-02-16 NOTE — Telephone Encounter (Signed)
I called pt to schedule STAT CT pt vm is full. thanks

## 2023-02-16 NOTE — Telephone Encounter (Signed)
Patient states she would like to verify the time of her CT scan tomorrow.  I let patient know that I see her appointment is scheduled for 3pm.  Patient states someone called her and told her it was at 12:45pm in the [place].  Please call patient to clarify time for her appointment.

## 2023-02-17 ENCOUNTER — Ambulatory Visit
Admission: RE | Admit: 2023-02-17 | Discharge: 2023-02-17 | Disposition: A | Discharge status: Home / Self Care | Payer: Medicare HMO | Source: Ambulatory Visit | Attending: Internal Medicine | Admitting: Internal Medicine

## 2023-02-17 DIAGNOSIS — R109 Unspecified abdominal pain: Secondary | ICD-10-CM

## 2023-02-17 DIAGNOSIS — I700 Atherosclerosis of aorta: Secondary | ICD-10-CM | POA: Diagnosis not present

## 2023-02-17 NOTE — Telephone Encounter (Signed)
I called pt back pt answered then no sound. I called back went to vm vm is full. Thanks

## 2023-02-19 ENCOUNTER — Encounter: Payer: Self-pay | Admitting: Internal Medicine

## 2023-02-19 NOTE — Assessment & Plan Note (Signed)
On crestor.  Low cholesterol diet and exercise.  Follow lipid panel and liver function tests.   

## 2023-02-19 NOTE — Assessment & Plan Note (Signed)
On thyroid replacement.  Follow tsh.  

## 2023-02-19 NOTE — Assessment & Plan Note (Signed)
Check urinalysis to confirm clear.

## 2023-02-19 NOTE — Assessment & Plan Note (Signed)
No acid reflux reported.  Follow.    

## 2023-02-19 NOTE — Assessment & Plan Note (Signed)
Continue amlodipine.  Follow pressures.  Follow metabolic panel. Her cuff correlates.

## 2023-02-19 NOTE — Assessment & Plan Note (Signed)
Stable.  Follow.   

## 2023-02-19 NOTE — Assessment & Plan Note (Signed)
02/22/19 - cecal polyp.  Recommended f/u colonoscopy in 10 years.   ?

## 2023-02-20 ENCOUNTER — Telehealth: Payer: Self-pay

## 2023-02-20 NOTE — Telephone Encounter (Signed)
Patient has not had pain like she has been having. OK with moving up GI appt. Will call [facility]

## 2023-02-20 NOTE — Telephone Encounter (Signed)
LM FOR PT TO CB  [name], MD  [facility] Please call and notify Ms Romo that her CT scan reveals no acute abnormality.  Given her persistent increased pain, I would like to get her back in with GI for evaluation.  Has seen [facility] Loffler in the past.  If agreeable - refer back.

## 2023-02-20 NOTE — Telephone Encounter (Signed)
Patient states she is returning our call. I read Dr. Randell Patient Scott's note to patient.  Patient states she has a referral in to Dr. Alice Reichert on 05/30/2023, so she is not interested in a referral to [facility] Loffler.  Patient states last Thursday and Friday (02/16/2023 & 02/17/2023) she didn't eat until 5pm and she states the pain went away after that, so she thinks it may have something to do with the food or diverticulitis.

## 2023-02-20 NOTE — Telephone Encounter (Signed)
FYI patient scheduled with Dr Alice Reichert 05/30/23

## 2023-02-20 NOTE — Telephone Encounter (Signed)
Please confirm - she has  not had any pain since Friday.  Also, need to see if can get an earlier appt with GI [facility] (per note, scheduled with Dr Alice Reichert in June).

## 2023-02-21 NOTE — Telephone Encounter (Signed)
Appt moved up to 5/23 2:00 with [facility]. Placed on cancellation list for sooner appt as well

## 2023-04-10 DIAGNOSIS — H5203 Hypermetropia, bilateral: Secondary | ICD-10-CM | POA: Diagnosis not present

## 2023-04-25 ENCOUNTER — Encounter: Payer: Self-pay | Admitting: Internal Medicine

## 2023-04-25 ENCOUNTER — Ambulatory Visit (INDEPENDENT_AMBULATORY_CARE_PROVIDER_SITE_OTHER): Payer: Medicare HMO | Admitting: Internal Medicine

## 2023-04-25 VITALS — BP 128/77 | HR 65 | Temp 97.9°F | Resp 16 | Ht 63.0 in | Wt 179.0 lb

## 2023-04-25 DIAGNOSIS — I10 Essential (primary) hypertension: Secondary | ICD-10-CM | POA: Diagnosis not present

## 2023-04-25 DIAGNOSIS — R030 Elevated blood-pressure reading, without diagnosis of hypertension: Secondary | ICD-10-CM

## 2023-04-25 DIAGNOSIS — Z9109 Other allergy status, other than to drugs and biological substances: Secondary | ICD-10-CM | POA: Diagnosis not present

## 2023-04-25 DIAGNOSIS — Z87448 Personal history of other diseases of urinary system: Secondary | ICD-10-CM | POA: Diagnosis not present

## 2023-04-25 DIAGNOSIS — E7800 Pure hypercholesterolemia, unspecified: Secondary | ICD-10-CM | POA: Diagnosis not present

## 2023-04-25 DIAGNOSIS — Z86010 Personal history of colonic polyps: Secondary | ICD-10-CM | POA: Diagnosis not present

## 2023-04-25 DIAGNOSIS — E039 Hypothyroidism, unspecified: Secondary | ICD-10-CM | POA: Diagnosis not present

## 2023-04-25 DIAGNOSIS — F419 Anxiety disorder, unspecified: Secondary | ICD-10-CM

## 2023-04-25 DIAGNOSIS — R1032 Left lower quadrant pain: Secondary | ICD-10-CM | POA: Diagnosis not present

## 2023-04-25 DIAGNOSIS — Z0000 Encounter for general adult medical examination without abnormal findings: Secondary | ICD-10-CM | POA: Diagnosis not present

## 2023-04-25 LAB — HEPATIC FUNCTION PANEL
ALT: 12 U/L (ref 0–35)
AST: 14 U/L (ref 0–37)
Albumin: 4 g/dL (ref 3.5–5.2)
Alkaline Phosphatase: 54 U/L (ref 39–117)
Bilirubin, Direct: 0.1 mg/dL (ref 0.0–0.3)
Total Bilirubin: 0.6 mg/dL (ref 0.2–1.2)
Total Protein: 7 g/dL (ref 6.0–8.3)

## 2023-04-25 LAB — BASIC METABOLIC PANEL
BUN: 15 mg/dL (ref 6–23)
CO2: 27 mEq/L (ref 19–32)
Calcium: 9.3 mg/dL (ref 8.4–10.5)
Chloride: 104 mEq/L (ref 96–112)
Creatinine, Ser: 1.06 mg/dL (ref 0.40–1.20)
GFR: 53.13 mL/min — ABNORMAL LOW (ref >60.00)
Glucose, Bld: 83 mg/dL (ref 70–99)
Potassium: 4.1 mEq/L (ref 3.5–5.1)
Sodium: 138 mEq/L (ref 135–145)

## 2023-04-25 LAB — LIPID PANEL
Cholesterol: 159 mg/dL (ref 0–200)
HDL: 58.8 mg/dL (ref >39.00)
LDL Cholesterol: 69 mg/dL (ref 0–99)
NonHDL: 99.85
Total CHOL/HDL Ratio: 3
Triglycerides: 155 mg/dL — ABNORMAL HIGH (ref 0.0–149.0)
VLDL: 31 mg/dL (ref 0.0–40.0)

## 2023-04-25 LAB — TSH: TSH: 1.12 u[IU]/mL (ref 0.35–5.50)

## 2023-04-25 MED ORDER — ROSUVASTATIN CALCIUM 20 MG PO TABS: 20.0000 mg | ORAL_TABLET | Freq: Every day | ORAL | 3 refills | Status: DC

## 2023-04-25 MED ORDER — AMLODIPINE BESYLATE 2.5 MG PO TABS: 2.5000 mg | ORAL_TABLET | Freq: Every day | ORAL | 3 refills | Status: DC

## 2023-04-25 MED ORDER — CEFDINIR 300 MG PO CAPS: 300.0000 mg | ORAL_CAPSULE | Freq: Two times a day (BID) | ORAL | 0 refills | Status: DC

## 2023-04-25 NOTE — Assessment & Plan Note (Signed)
Physical today 04/25/23.  Mammogram 11/17/22 - Birads I.  Colonoscopy 02/2019 - 2mm polyp (cecum).

## 2023-04-25 NOTE — Progress Notes (Signed)
Subjective:    Patient ID: Jill Torres, female    DOB: 04/25/1951, 72 y.o.   MRN: [id number]  Patient here for  Chief Complaint  Patient presents with   Annual Exam    HPI Here today for a physical exam.  Recently evaluated - LLQ pain.  Was initially treated UC for UTI and possible diverticulitis.  Treated with augmentin and cipro.  Persistent pain last visit with me.  CT - no acute abnormality.  Reports pain has resolved.  Is having loose stool - 3-4 episodes in the am and then ok for the rest of the day.  No nausea or vomiting. Has appt with GI this week.  Last colonoscopy 2020.  Has had issues with allergies.  Symptoms worsened 5 days ago.  Increased sinus pressure.  Previous headache.  Increased nasal congestion.  Increased drainage.  Scratchy throat.  No chest congestion.  Occasionally will cough up some green mucus - feel coming from drainage.  No chest tightness or sob.  Blood pressures at home 120s/80.     Past Medical History:  Diagnosis Date   Allergy    Arthritis    Depression    Diverticulosis of colon (without mention of hemorrhage)    GERD (gastroesophageal reflux disease)    History of chicken pox    History of shingles may 2013   HLD (hyperlipidemia)    HTN (hypertension)    Irritable bowel syndrome    Unspecified hypothyroidism    Past Surgical History:  Procedure Laterality Date   ABDOMINAL HYSTERECTOMY  1983   due to heavy bleeding & ovary cyst   APPENDECTOMY  1974   BLADDER DIVERTICULECTOMY  1990   CESAREAN SECTION     x 2   CHOLECYSTECTOMY     DILATION AND CURETTAGE OF UTERUS     x 2   DIRECT LARYNGOSCOPY Right 06/11/2015   Procedure: suspension microdirect laryngoscopy with biopsy of right tongue base;  Surgeon: Bud Face, MD;  Location: [facility];  Service: ENT;  Laterality: Right;   NASAL SEPTUM SURGERY  1999   PARTIAL HYSTERECTOMY  1983   cyst on ovary and heavy bleeding    Family History  Problem Relation Age of Onset   Arthritis  Mother    Hypertension Mother    Hyperlipidemia Mother    Atrial fibrillation Mother    Heart disease Mother    Diabetes Mother    Colon polyps Mother    Hyperlipidemia Father    Heart disease Father        s/p CABG   Stroke Father    Rheumatic fever Sister        s/p valve replacement   Deep vein thrombosis Brother        after immobilization    Colon polyps Brother    Heart disease Brother    Diabetes Maternal Grandfather    Heart disease Brother    Diabetes Maternal Aunt    Diabetes Maternal Uncle    Breast cancer Neg Hx    Colon cancer Neg Hx    Esophageal cancer Neg Hx    Rectal cancer Neg Hx    Stomach cancer Neg Hx    Social History   Socioeconomic History   Marital status: Single    Spouse name: Not on file   Number of children: 2   Years of education: Not on file   Highest education level: Not on file  Occupational History   Occupation: retired  Comment: [employer]   Tobacco Use   Smoking status: Never   Smokeless tobacco: Never  Substance and Sexual Activity   Alcohol use: No    Alcohol/week: 0.0 standard drinks of alcohol   Drug use: No   Sexual activity: Not on file  Other Topics Concern   Not on file  Social History Narrative   No regular exercise   Lives in noisy environment- very stressful    Daily caffeine use    Social Determinants of Health   Financial Resource Strain: Low Risk  (02/10/2022)   Overall Financial Resource Strain (CARDIA)    Difficulty of Paying Living Expenses: Not hard at all  Food Insecurity: No Food Insecurity (02/10/2022)   Hunger Vital Sign    Worried About Running Out of Food in the Last Year: Never true    Ran Out of Food in the Last Year: Never true  Transportation Needs: No Transportation Needs (02/10/2022)   PRAPARE - Administrator, Civil Service (Medical): No    Lack of Transportation (Non-Medical): No  Physical Activity: Insufficiently Active (02/10/2022)   Exercise Vital Sign    Days  of Exercise per Week: 4 days    Minutes of Exercise per Session: 20 min  Stress: No Stress Concern Present (02/10/2022)   [employer] of Occupational Health - Occupational Stress Questionnaire    Feeling of Stress : Not at all  Social Connections: Socially Integrated (02/10/2022)   Social Connection and Isolation Panel [NHANES]    Frequency of Communication with Friends and Family: More than three times a week    Frequency of Social Gatherings with Friends and Family: More than three times a week    Attends Religious Services: 1 to 4 times per year    Active Member of [employer] or Organizations: Yes    Attends Banker Meetings: Not on file    Marital Status: Married     Review of Systems  Constitutional:  Negative for appetite change and unexpected weight change.  HENT:  Positive for congestion and postnasal drip. Negative for sinus pressure and sore throat.        Scratchy throat.   Eyes:  Negative for pain and visual disturbance.  Respiratory:  Negative for chest tightness and shortness of breath.        Occasional cough.   Cardiovascular:  Negative for chest pain, palpitations and leg swelling.  Gastrointestinal:  Negative for abdominal pain, diarrhea, nausea and vomiting.  Genitourinary:  Negative for difficulty urinating and dysuria.  Musculoskeletal:  Negative for joint swelling and myalgias.  Skin:  Negative for color change and rash.  Neurological:  Negative for dizziness and headaches.  Hematological:  Negative for adenopathy. Does not bruise/bleed easily.  Psychiatric/Behavioral:  Negative for agitation and dysphoric mood.        Objective:     BP 128/77   Pulse 65   Temp 97.9 F (36.6 C)   Resp 16   Ht 5\' 3"  (1.6 m)   Wt 179 lb (81.2 kg)   LMP 12/12/1981   SpO2 98%   BMI 31.71 kg/m  Wt Readings from Last 3 Encounters:  04/25/23 179 lb (81.2 kg)  02/15/23 174 lb 3.2 oz (79 kg)  12/22/22 176 lb 9.6 oz (80.1 kg)    Physical Exam Vitals  reviewed.  Constitutional:      General: She is not in acute distress.    Appearance: Normal appearance. She is well-developed.  HENT:  Head: Normocephalic and atraumatic.     Right Ear: External ear normal.     Left Ear: External ear normal.  Eyes:     General: No scleral icterus.       Right eye: No discharge.        Left eye: No discharge.     Conjunctiva/sclera: Conjunctivae normal.  Neck:     Thyroid: No thyromegaly.  Cardiovascular:     Rate and Rhythm: Normal rate and regular rhythm.  Pulmonary:     Effort: No tachypnea, accessory muscle usage or respiratory distress.     Breath sounds: Normal breath sounds. No decreased breath sounds or wheezing.  Chest:  Breasts:    Right: No inverted nipple, mass, nipple discharge or tenderness (no axillary adenopathy).     Left: No inverted nipple, mass, nipple discharge or tenderness (no axilarry adenopathy).  Abdominal:     General: Bowel sounds are normal.     Palpations: Abdomen is soft.     Tenderness: There is no abdominal tenderness.  Musculoskeletal:        General: No swelling or tenderness.     Cervical back: Neck supple.  Lymphadenopathy:     Cervical: No cervical adenopathy.  Skin:    Findings: No erythema or rash.  Neurological:     Mental Status: She is alert and oriented to person, place, and time.  Psychiatric:        Mood and Affect: Mood normal.        Behavior: Behavior normal.      Outpatient Encounter Medications as of 04/25/2023  Medication Sig   cefdinir (OMNICEF) 300 MG capsule Take 1 capsule (300 mg total) by mouth 2 (two) times daily.   amLODipine (NORVASC) 2.5 MG tablet Take 1 tablet (2.5 mg total) by mouth daily.   Ascorbic Acid (VITAMIN C) 1000 MG tablet Take 1,000 mg by mouth daily.   Azelastine HCl 137 MCG/SPRAY SOLN PLACE 1 SPRAY INTO BOTH NOSTRILS 2 (TWO) TIMES DAILY. USE IN EACH NOSTRIL AS DIRECTED   bifidobacterium infantis (ALIGN) capsule Take 1 capsule by mouth daily.    calcium-vitamin D (OSCAL WITH D 500-200) 500-200 MG-UNIT per tablet Take 1 tablet by mouth daily.   Coenzyme Q10 (COQ10 PO) Take by mouth daily.   fish oil-omega-3 fatty acids 1000 MG capsule Take 2 g by mouth daily.   Fluticasone Furoate (FLONASE SENSIMIST NA) Place into the nose.   multivitamin (THERAGRAN) per tablet Take 1 tablet by mouth daily.   polyethylene glycol powder (GLYCOLAX/MIRALAX) 17 GM/SCOOP powder Take 17 g by mouth as needed.   rosuvastatin (CRESTOR) 20 MG tablet Take 1 tablet (20 mg total) by mouth daily.   [DISCONTINUED] amLODipine (NORVASC) 2.5 MG tablet Take 1 tablet (2.5 mg total) by mouth daily.   [DISCONTINUED] rosuvastatin (CRESTOR) 20 MG tablet Take 1 tablet (20 mg total) by mouth daily.   [DISCONTINUED] tiZANidine (ZANAFLEX) 2 MG tablet Take 1 tablet (2 mg total) by mouth at bedtime as needed for muscle spasms.   No facility-administered encounter medications on file as of 04/25/2023.     Lab Results  Component Value Date   WBC 10.4 02/15/2023   HGB 13.3 02/15/2023   HCT 39.8 02/15/2023   PLT 346.0 02/15/2023   GLUCOSE 83 04/25/2023   CHOL 159 04/25/2023   TRIG 155.0 (H) 04/25/2023   HDL 58.80 04/25/2023   LDLDIRECT 81.0 07/22/2022   LDLCALC 69 04/25/2023   ALT 12 04/25/2023   AST 14 04/25/2023  NA 138 04/25/2023   K 4.1 04/25/2023   CL 104 04/25/2023   CREATININE 1.06 04/25/2023   BUN 15 04/25/2023   CO2 27 04/25/2023   TSH 1.12 04/25/2023    CT Abdomen Pelvis Wo Contrast  Result Date: 02/17/2023 CLINICAL DATA:  72 year old female with acute LEFT abdominal and pelvic pain. EXAM: CT ABDOMEN AND PELVIS WITHOUT CONTRAST TECHNIQUE: Multidetector CT imaging of the abdomen and pelvis was performed following the standard protocol without IV contrast. RADIATION DOSE REDUCTION: This exam was performed according to the departmental dose-optimization program which includes automated exposure control, adjustment of the mA and/or kV according to patient size  and/or use of iterative reconstruction technique. COMPARISON:  08/09/2019 CT FINDINGS: Please note that parenchymal and vascular abnormalities may be missed as intravenous contrast was not administered. Lower chest: No acute abnormality. Hepatobiliary: The liver is unremarkable. The patient is status post cholecystectomy. There is no evidence of intrahepatic or extrahepatic biliary dilatation. Pancreas: Unremarkable Spleen: Unremarkable Adrenals/Urinary Tract: The kidneys, adrenal glands and bladder are unremarkable. Stomach/Bowel: Stomach is within normal limits. No evidence of bowel wall thickening, distention, or inflammatory changes. Colonic diverticulosis identified without evidence of acute diverticulitis. Vascular/Lymphatic: Aortic atherosclerosis. No enlarged abdominal or pelvic lymph nodes. Reproductive: Status post hysterectomy. No adnexal masses. Other: No ascites, focal collection or pneumoperitoneum. Musculoskeletal: No acute or suspicious bony abnormalities are noted. Mild degenerative disc disease in the LOWER lumbar spine again noted. IMPRESSION: 1. No evidence of acute abnormality. 2.  Aortic Atherosclerosis (ICD10-I70.0). Electronically Signed   By: Harmon Pier M.D.   On: 02/17/2023 14:55       Assessment & Plan:  Hypercholesterolemia Assessment & Plan: On crestor.  Low cholesterol diet and exercise.  Follow lipid panel and liver function tests.   Orders: -     Hepatic function panel -     Lipid panel  Hypertension, unspecified type Assessment & Plan: Continue amlodipine.  Follow pressures.  Follow metabolic panel. Her cuff correlates.    Orders: -     Basic metabolic panel  Hypothyroidism, unspecified type Assessment & Plan: On thyroid replacement.  Follow tsh.   Orders: -     TSH  Health care maintenance Assessment & Plan: Physical today 04/25/23.  Mammogram 11/17/22 - Birads I.  Colonoscopy 02/2019 - 2mm polyp (cecum).    Elevated blood pressure reading -      amLODIPine Besylate; Take 1 tablet (2.5 mg total) by mouth daily.  Dispense: 90 tablet; Refill: 3  Left lower quadrant abdominal pain Assessment & Plan: Here today for a physical exam.  Recently evaluated - LLQ pain.  Was initially treated UC for UTI and possible diverticulitis.  Treated with augmentin and cipro.  Persistent pain last visit with me.  CT - no acute abnormality.  Reports pain has resolved.  Is having loose stool - 3-4 episodes in the am and then ok for the rest of the day.  No nausea or vomiting. Has appt with GI this week.  Last colonoscopy 2020. Benefiber.     Anxiety Assessment & Plan: Stable.  Follow.    COLONIC POLYPS, HYPERPLASTIC, HX OF Assessment & Plan: 02/22/19 - cecal polyp.  Recommended f/u colonoscopy in 10 years.     Environmental allergies Assessment & Plan: Continues on zyrtec and astelin.  Appears to have progressed.  Now with sinus symptoms - appears to be c/w sinusitis.  Treat with omnicef as directed.  astelin and steroid nasal spray as directed. Call with update.  History of hematuria Assessment & Plan: Urine - no red blood cells (trace heme) - 02/15/23.    Other orders -     Rosuvastatin Calcium; Take 1 tablet (20 mg total) by mouth daily.  Dispense: 90 tablet; Refill: 3 -     Cefdinir; Take 1 capsule (300 mg total) by mouth 2 (two) times daily.  Dispense: 20 capsule; Refill: 0     Dale Tecumseh, MD

## 2023-05-04 DIAGNOSIS — K589 Irritable bowel syndrome without diarrhea: Secondary | ICD-10-CM | POA: Diagnosis not present

## 2023-05-08 ENCOUNTER — Encounter: Payer: Self-pay | Admitting: Internal Medicine

## 2023-05-08 NOTE — Assessment & Plan Note (Signed)
Continues on zyrtec and astelin.  Appears to have progressed.  Now with sinus symptoms - appears to be c/w sinusitis.  Treat with omnicef as directed.  astelin and steroid nasal spray as directed. Call with update.

## 2023-05-08 NOTE — Assessment & Plan Note (Signed)
On crestor.  Low cholesterol diet and exercise.  Follow lipid panel and liver function tests.   

## 2023-05-08 NOTE — Assessment & Plan Note (Signed)
Here today for a physical exam.  Recently evaluated - LLQ pain.  Was initially treated UC for UTI and possible diverticulitis.  Treated with augmentin and cipro.  Persistent pain last visit with me.  CT - no acute abnormality.  Reports pain has resolved.  Is having loose stool - 3-4 episodes in the am and then ok for the rest of the day.  No nausea or vomiting. Has appt with GI this week.  Last colonoscopy 2020. Benefiber.

## 2023-05-08 NOTE — Assessment & Plan Note (Signed)
Continue amlodipine.  Follow pressures.  Follow metabolic panel. Her cuff correlates.   

## 2023-05-08 NOTE — Assessment & Plan Note (Signed)
Stable.  Follow.   

## 2023-05-08 NOTE — Assessment & Plan Note (Signed)
Urine - no red blood cells (trace heme) - 02/15/23.

## 2023-05-08 NOTE — Assessment & Plan Note (Signed)
02/22/19 - cecal polyp.  Recommended f/u colonoscopy in 10 years.   ?

## 2023-05-08 NOTE — Assessment & Plan Note (Signed)
On thyroid replacement.  Follow tsh.  

## 2023-07-13 DIAGNOSIS — H5203 Hypermetropia, bilateral: Secondary | ICD-10-CM | POA: Diagnosis not present

## 2023-07-13 DIAGNOSIS — H40013 Open angle with borderline findings, low risk, bilateral: Secondary | ICD-10-CM | POA: Diagnosis not present

## 2023-07-13 DIAGNOSIS — H52223 Regular astigmatism, bilateral: Secondary | ICD-10-CM | POA: Diagnosis not present

## 2023-07-13 DIAGNOSIS — H43812 Vitreous degeneration, left eye: Secondary | ICD-10-CM | POA: Diagnosis not present

## 2023-07-13 DIAGNOSIS — H2513 Age-related nuclear cataract, bilateral: Secondary | ICD-10-CM | POA: Diagnosis not present

## 2023-07-13 DIAGNOSIS — Z0100 Encounter for examination of eyes and vision without abnormal findings: Secondary | ICD-10-CM | POA: Diagnosis not present

## 2023-07-13 DIAGNOSIS — H524 Presbyopia: Secondary | ICD-10-CM | POA: Diagnosis not present

## 2023-07-27 ENCOUNTER — Encounter (INDEPENDENT_AMBULATORY_CARE_PROVIDER_SITE_OTHER): Payer: Self-pay

## 2023-08-11 ENCOUNTER — Other Ambulatory Visit: Payer: Self-pay | Admitting: Internal Medicine

## 2023-08-29 ENCOUNTER — Encounter: Payer: Self-pay | Admitting: Internal Medicine

## 2023-08-29 ENCOUNTER — Ambulatory Visit (INDEPENDENT_AMBULATORY_CARE_PROVIDER_SITE_OTHER): Payer: Medicare HMO | Admitting: Internal Medicine

## 2023-08-29 VITALS — BP 118/70 | HR 71 | Temp 98.0°F | Resp 16 | Ht 63.0 in | Wt 174.0 lb

## 2023-08-29 DIAGNOSIS — I10 Essential (primary) hypertension: Secondary | ICD-10-CM | POA: Diagnosis not present

## 2023-08-29 DIAGNOSIS — Z9109 Other allergy status, other than to drugs and biological substances: Secondary | ICD-10-CM | POA: Diagnosis not present

## 2023-08-29 DIAGNOSIS — K589 Irritable bowel syndrome without diarrhea: Secondary | ICD-10-CM | POA: Diagnosis not present

## 2023-08-29 DIAGNOSIS — E7800 Pure hypercholesterolemia, unspecified: Secondary | ICD-10-CM

## 2023-08-29 DIAGNOSIS — Z1231 Encounter for screening mammogram for malignant neoplasm of breast: Secondary | ICD-10-CM

## 2023-08-29 DIAGNOSIS — Z86010 Personal history of colonic polyps: Secondary | ICD-10-CM

## 2023-08-29 DIAGNOSIS — E039 Hypothyroidism, unspecified: Secondary | ICD-10-CM

## 2023-08-29 DIAGNOSIS — Z23 Encounter for immunization: Secondary | ICD-10-CM

## 2023-08-29 LAB — HEPATIC FUNCTION PANEL
ALT: 12 U/L (ref 0–35)
AST: 15 U/L (ref 0–37)
Albumin: 4.1 g/dL (ref 3.5–5.2)
Alkaline Phosphatase: 61 U/L (ref 39–117)
Bilirubin, Direct: 0.1 mg/dL (ref 0.0–0.3)
Total Bilirubin: 0.7 mg/dL (ref 0.2–1.2)
Total Protein: 6.8 g/dL (ref 6.0–8.3)

## 2023-08-29 LAB — LIPID PANEL
Cholesterol: 158 mg/dL (ref 0–200)
HDL: 64.9 mg/dL (ref >39.00)
LDL Cholesterol: 59 mg/dL (ref 0–99)
NonHDL: 92.89
Total CHOL/HDL Ratio: 2
Triglycerides: 167 mg/dL — ABNORMAL HIGH (ref 0.0–149.0)
VLDL: 33.4 mg/dL (ref 0.0–40.0)

## 2023-08-29 LAB — BASIC METABOLIC PANEL
BUN: 12 mg/dL (ref 6–23)
CO2: 27 meq/L (ref 19–32)
Calcium: 9.2 mg/dL (ref 8.4–10.5)
Chloride: 103 meq/L (ref 96–112)
Creatinine, Ser: 0.99 mg/dL (ref 0.40–1.20)
GFR: 57.53 mL/min — ABNORMAL LOW (ref >60.00)
Glucose, Bld: 93 mg/dL (ref 70–99)
Potassium: 4.3 meq/L (ref 3.5–5.1)
Sodium: 138 meq/L (ref 135–145)

## 2023-08-29 MED ORDER — AZELASTINE HCL 137 MCG/SPRAY NA SOLN: NASAL | 1 refills | Status: DC

## 2023-08-29 MED ORDER — FLUTICASONE PROPIONATE 50 MCG/ACT NA SUSP: 2.0000 | Freq: Every day | NASAL | 6 refills | Status: DC

## 2023-08-29 NOTE — Assessment & Plan Note (Signed)
Bowels stable.  Recently saw GI as outlined.  Wants to follow.  Continues on metamucil.

## 2023-08-29 NOTE — Assessment & Plan Note (Signed)
Continue amlodipine.  Follow pressures.  Follow metabolic panel. Her cuff correlates.

## 2023-08-29 NOTE — Assessment & Plan Note (Signed)
On crestor.  Low cholesterol diet and exercise.  Follow lipid panel and liver function tests.

## 2023-08-29 NOTE — Assessment & Plan Note (Signed)
On thyroid replacement.  Follow tsh.  

## 2023-08-29 NOTE — Assessment & Plan Note (Signed)
Continues on astelin and flonase.  Feels controlled.  Follow.

## 2023-08-29 NOTE — Progress Notes (Signed)
Subjective:    Patient ID: Jill Torres, female    DOB: 1951/02/04, 72 y.o.   MRN: [id number]  Patient here for  Chief Complaint  Patient presents with   Medical Management of Chronic Issues    HPI Here to follow up regarding hypercholesterolemia and hypertension. Reported history of IBS.  Saw GI 05/04/23 - recommended hydrogen breath test. Fiber change. Taking metamucil.  Breath test per her report negative. Bowels stable. No abdominal pain.  Had recommended a 6 week f/u. She feels things are stable and wants to hold on f/u with GI at this time.  No chest pain or sob reported.  Some allergy symptoms.  Controls with astelin and flonase.     Past Medical History:  Diagnosis Date   Allergy    Arthritis    Depression    Diverticulosis of colon (without mention of hemorrhage)    GERD (gastroesophageal reflux disease)    History of chicken pox    History of shingles may 2013   HLD (hyperlipidemia)    HTN (hypertension)    Irritable bowel syndrome    Unspecified hypothyroidism    Past Surgical History:  Procedure Laterality Date   ABDOMINAL HYSTERECTOMY  1983   due to heavy bleeding & ovary cyst   APPENDECTOMY  1974   BLADDER DIVERTICULECTOMY  1990   CESAREAN SECTION     x 2   CHOLECYSTECTOMY     DILATION AND CURETTAGE OF UTERUS     x 2   DIRECT LARYNGOSCOPY Right 06/11/2015   Procedure: suspension microdirect laryngoscopy with biopsy of right tongue base;  Surgeon: Bud Face, MD;  Location: [facility];  Service: ENT;  Laterality: Right;   NASAL SEPTUM SURGERY  1999   PARTIAL HYSTERECTOMY  1983   cyst on ovary and heavy bleeding    Family History  Problem Relation Age of Onset   Arthritis Mother    Hypertension Mother    Hyperlipidemia Mother    Atrial fibrillation Mother    Heart disease Mother    Diabetes Mother    Colon polyps Mother    Hyperlipidemia Father    Heart disease Father        s/p CABG   Stroke Father    Rheumatic fever Sister        s/p  valve replacement   Deep vein thrombosis Brother        after immobilization    Colon polyps Brother    Heart disease Brother    Diabetes Maternal Grandfather    Heart disease Brother    Diabetes Maternal Aunt    Diabetes Maternal Uncle    Breast cancer Neg Hx    Colon cancer Neg Hx    Esophageal cancer Neg Hx    Rectal cancer Neg Hx    Stomach cancer Neg Hx    Social History   Socioeconomic History   Marital status: Single    Spouse name: Not on file   Number of children: 2   Years of education: Not on file   Highest education level: Not on file  Occupational History   Occupation: retired    Comment: Primary school teacher products   Tobacco Use   Smoking status: Never   Smokeless tobacco: Never  Substance and Sexual Activity   Alcohol use: No    Alcohol/week: 0.0 standard drinks of alcohol   Drug use: No   Sexual activity: Not on file  Other Topics Concern   Not on file  Social History Narrative   No regular exercise   Lives in noisy environment- very stressful    Daily caffeine use    Social Determinants of Health   Financial Resource Strain: Low Risk  (02/10/2022)   Overall Financial Resource Strain (CARDIA)    Difficulty of Paying Living Expenses: Not hard at all  Food Insecurity: No Food Insecurity (02/10/2022)   Hunger Vital Sign    Worried About Running Out of Food in the Last Year: Never true    Ran Out of Food in the Last Year: Never true  Transportation Needs: No Transportation Needs (02/10/2022)   PRAPARE - Administrator, Civil Service (Medical): No    Lack of Transportation (Non-Medical): No  Physical Activity: Insufficiently Active (02/10/2022)   Exercise Vital Sign    Days of Exercise per Week: 4 days    Minutes of Exercise per Session: 20 min  Stress: No Stress Concern Present (02/10/2022)   [facility] [employer] - Occupational Stress Questionnaire    Feeling of Stress : Not at all  Social Connections: Socially Integrated  (02/10/2022)   Social Connection and Isolation Panel [NHANES]    Frequency of Communication with Friends and Family: More than three times a week    Frequency of Social Gatherings with Friends and Family: More than three times a week    Attends Religious Services: 1 to 4 times per year    Active Member of [employer] or Organizations: Yes    Attends Banker Meetings: Not on file    Marital Status: Married     Review of Systems  Constitutional:  Negative for appetite change and unexpected weight change.  HENT:  Negative for congestion and sinus pressure.   Respiratory:  Negative for cough, chest tightness and shortness of breath.   Cardiovascular:  Negative for chest pain and palpitations.  Gastrointestinal:  Negative for abdominal pain, diarrhea, nausea and vomiting.  Genitourinary:  Negative for difficulty urinating and dysuria.  Musculoskeletal:  Negative for joint swelling and myalgias.  Skin:  Negative for color change and rash.  Neurological:  Negative for dizziness and headaches.  Psychiatric/Behavioral:  Negative for agitation and dysphoric mood.        Objective:     BP 118/70   Pulse 71   Temp 98 F (36.7 C)   Resp 16   Ht 5\' 3"  (1.6 m)   Wt 174 lb (78.9 kg)   LMP 12/12/1981   SpO2 98%   BMI 30.82 kg/m  Wt Readings from Last 3 Encounters:  08/29/23 174 lb (78.9 kg)  04/25/23 179 lb (81.2 kg)  02/15/23 174 lb 3.2 oz (79 kg)    Physical Exam Vitals reviewed.  Constitutional:      General: She is not in acute distress.    Appearance: Normal appearance.  HENT:     Head: Normocephalic and atraumatic.     Right Ear: External ear normal.     Left Ear: External ear normal.  Eyes:     General: No scleral icterus.       Right eye: No discharge.        Left eye: No discharge.     Conjunctiva/sclera: Conjunctivae normal.  Neck:     Thyroid: No thyromegaly.  Cardiovascular:     Rate and Rhythm: Normal rate and regular rhythm.  Pulmonary:     Effort: No  respiratory distress.     Breath sounds: Normal breath sounds. No wheezing.  Abdominal:  General: Bowel sounds are normal.     Palpations: Abdomen is soft.     Tenderness: There is no abdominal tenderness.  Musculoskeletal:        General: No swelling or tenderness.     Cervical back: Neck supple. No tenderness.  Lymphadenopathy:     Cervical: No cervical adenopathy.  Skin:    Findings: No erythema or rash.  Neurological:     Mental Status: She is alert.  Psychiatric:        Mood and Affect: Mood normal.        Behavior: Behavior normal.      Outpatient Encounter Medications as of 08/29/2023  Medication Sig   fluticasone (FLONASE) 50 MCG/ACT nasal spray Place 2 sprays into both nostrils daily.   amLODipine (NORVASC) 2.5 MG tablet Take 1 tablet (2.5 mg total) by mouth daily.   Ascorbic Acid (VITAMIN C) 1000 MG tablet Take 1,000 mg by mouth daily.   Azelastine HCl 137 MCG/SPRAY SOLN PLACE 1 SPRAY INTO BOTH NOSTRILS 2 (TWO) TIMES DAILY. USE IN EACH NOSTRIL AS DIRECTED   bifidobacterium infantis (ALIGN) capsule Take 1 capsule by mouth daily.   calcium-vitamin D (OSCAL WITH D 500-200) 500-200 MG-UNIT per tablet Take 1 tablet by mouth daily.   Coenzyme Q10 (COQ10 PO) Take by mouth daily.   fish oil-omega-3 fatty acids 1000 MG capsule Take 2 g by mouth daily.   multivitamin (THERAGRAN) per tablet Take 1 tablet by mouth daily.   polyethylene glycol powder (GLYCOLAX/MIRALAX) 17 GM/SCOOP powder Take 17 g by mouth as needed.   rosuvastatin (CRESTOR) 20 MG tablet Take 1 tablet (20 mg total) by mouth daily.   [DISCONTINUED] Azelastine HCl 137 MCG/SPRAY SOLN PLACE 1 SPRAY INTO BOTH NOSTRILS 2 (TWO) TIMES DAILY. USE IN EACH NOSTRIL AS DIRECTED   [DISCONTINUED] cefdinir (OMNICEF) 300 MG capsule Take 1 capsule (300 mg total) by mouth 2 (two) times daily.   [DISCONTINUED] Fluticasone Furoate (FLONASE SENSIMIST NA) Place into the nose.   No facility-administered encounter medications on file as  of 08/29/2023.     Lab Results  Component Value Date   WBC 10.4 02/15/2023   HGB 13.3 02/15/2023   HCT 39.8 02/15/2023   PLT 346.0 02/15/2023   GLUCOSE 83 04/25/2023   CHOL 159 04/25/2023   TRIG 155.0 (H) 04/25/2023   HDL 58.80 04/25/2023   LDLDIRECT 81.0 07/22/2022   LDLCALC 69 04/25/2023   ALT 12 04/25/2023   AST 14 04/25/2023   NA 138 04/25/2023   K 4.1 04/25/2023   CL 104 04/25/2023   CREATININE 1.06 04/25/2023   BUN 15 04/25/2023   CO2 27 04/25/2023   TSH 1.12 04/25/2023    CT Abdomen Pelvis Wo Contrast  Result Date: 02/17/2023 CLINICAL DATA:  72 year old female with acute LEFT abdominal and pelvic pain. EXAM: CT ABDOMEN AND PELVIS WITHOUT CONTRAST TECHNIQUE: Multidetector CT imaging of the abdomen and pelvis was performed following the standard protocol without IV contrast. RADIATION DOSE REDUCTION: This exam was performed according to the departmental dose-optimization program which includes automated exposure control, adjustment of the mA and/or kV according to patient size and/or use of iterative reconstruction technique. COMPARISON:  08/09/2019 CT FINDINGS: Please note that parenchymal and vascular abnormalities may be missed as intravenous contrast was not administered. Lower chest: No acute abnormality. Hepatobiliary: The liver is unremarkable. The patient is status post cholecystectomy. There is no evidence of intrahepatic or extrahepatic biliary dilatation. Pancreas: Unremarkable Spleen: Unremarkable Adrenals/Urinary Tract: The kidneys, adrenal glands and bladder  are unremarkable. Stomach/Bowel: Stomach is within normal limits. No evidence of bowel wall thickening, distention, or inflammatory changes. Colonic diverticulosis identified without evidence of acute diverticulitis. Vascular/Lymphatic: Aortic atherosclerosis. No enlarged abdominal or pelvic lymph nodes. Reproductive: Status post hysterectomy. No adnexal masses. Other: No ascites, focal collection or pneumoperitoneum.  Musculoskeletal: No acute or suspicious bony abnormalities are noted. Mild degenerative disc disease in the LOWER lumbar spine again noted. IMPRESSION: 1. No evidence of acute abnormality. 2.  Aortic Atherosclerosis (ICD10-I70.0). Electronically Signed   By: Harmon Pier M.D.   On: 02/17/2023 14:55       Assessment & Plan:  Visit for screening mammogram -     3D Screening Mammogram, Left and Right; Future  Hypercholesterolemia Assessment & Plan: On crestor.  Low cholesterol diet and exercise.  Follow lipid panel and liver function tests.   Orders: -     Basic metabolic panel -     Lipid panel -     Hepatic function panel  Need for influenza vaccination -     Flu Vaccine Trivalent High Dose (Fluad)  Irritable bowel syndrome without diarrhea Assessment & Plan: Bowels stable.  Recently saw GI as outlined.  Wants to follow.  Continues on metamucil.    Hypothyroidism, unspecified type Assessment & Plan: On thyroid replacement.  Follow tsh.    Hypertension, unspecified type Assessment & Plan: Continue amlodipine.  Follow pressures.  Follow metabolic panel. Her cuff correlates.     COLONIC POLYPS, HYPERPLASTIC, HX OF Assessment & Plan: 02/22/19 - cecal polyp.  Recommended f/u colonoscopy in 10 years.     Environmental allergies Assessment & Plan: Continues on astelin and flonase.  Feels controlled.  Follow.    Other orders -     Azelastine HCl; PLACE 1 SPRAY INTO BOTH NOSTRILS 2 (TWO) TIMES DAILY. USE IN EACH NOSTRIL AS DIRECTED  Dispense: 30 mL; Refill: 1 -     Fluticasone Propionate; Place 2 sprays into both nostrils daily.  Dispense: 16 g; Refill: 6     Dale Elfrida, MD

## 2023-08-29 NOTE — Assessment & Plan Note (Signed)
02/22/19 - cecal polyp.  Recommended f/u colonoscopy in 10 years.   ?

## 2023-09-04 ENCOUNTER — Ambulatory Visit (INDEPENDENT_AMBULATORY_CARE_PROVIDER_SITE_OTHER): Payer: Medicare HMO | Admitting: *Deleted

## 2023-09-04 VITALS — Ht 63.0 in | Wt 174.0 lb

## 2023-09-04 DIAGNOSIS — Z780 Asymptomatic menopausal state: Secondary | ICD-10-CM

## 2023-09-04 DIAGNOSIS — Z0000 Encounter for general adult medical examination without abnormal findings: Secondary | ICD-10-CM

## 2023-09-04 NOTE — Progress Notes (Signed)
Subjective:   Jill Torres is a 72 y.o. female who presents for Medicare Annual (Subsequent) preventive examination.  Visit Complete: Virtual  I connected with  Jill Torres on 09/04/23 by a audio enabled telemedicine application and verified that I am speaking with the correct person using two identifiers.  Patient Location: Home  Provider Location: Office/Clinic  I discussed the limitations of evaluation and management by telemedicine. The patient expressed understanding and agreed to proceed.  Vital Signs: Unable to obtain new vitals due to this being a telehealth visit.  Cardiac Risk Factors include: advanced age (>64men, >74 women);dyslipidemia;hypertension;obesity (BMI >30kg/m2)     Objective:    Today's Vitals   09/04/23 0858  Weight: 174 lb (78.9 kg)  Height: 5\' 3"  (1.6 m)   Body mass index is 30.82 kg/m.     09/04/2023    9:12 AM 02/10/2022    8:39 AM 02/09/2021    8:47 AM 02/07/2020    8:37 AM 06/11/2015    8:14 AM 06/04/2015    9:18 AM  Advanced Directives  Does Patient Have a Medical Advance Directive? No No No No No No  Would patient like information on creating a medical advance directive? No - Patient declined No - Patient declined No - Patient declined No - Patient declined No - patient declined information     Current Medications (verified) Outpatient Encounter Medications as of 09/04/2023  Medication Sig   amLODipine (NORVASC) 2.5 MG tablet Take 1 tablet (2.5 mg total) by mouth daily.   Ascorbic Acid (VITAMIN C) 1000 MG tablet Take 1,000 mg by mouth daily.   Azelastine HCl 137 MCG/SPRAY SOLN PLACE 1 SPRAY INTO BOTH NOSTRILS 2 (TWO) TIMES DAILY. USE IN EACH NOSTRIL AS DIRECTED   bifidobacterium infantis (ALIGN) capsule Take 1 capsule by mouth daily.   calcium-vitamin D (OSCAL WITH D 500-200) 500-200 MG-UNIT per tablet Take 1 tablet by mouth daily.   Coenzyme Q10 (COQ10 PO) Take by mouth daily.   fish oil-omega-3 fatty acids 1000 MG capsule Take 2  g by mouth daily.   fluticasone (FLONASE) 50 MCG/ACT nasal spray Place 2 sprays into both nostrils daily.   multivitamin (THERAGRAN) per tablet Take 1 tablet by mouth daily.   polyethylene glycol powder (GLYCOLAX/MIRALAX) 17 GM/SCOOP powder Take 17 g by mouth as needed.   psyllium (REGULOID) 0.52 g capsule Take 0.52 g by mouth daily as needed.   rosuvastatin (CRESTOR) 20 MG tablet Take 1 tablet (20 mg total) by mouth daily.   No facility-administered encounter medications on file as of 09/04/2023.    Allergies (verified) Patient has no known allergies.   History: Past Medical History:  Diagnosis Date   Allergy    Arthritis    Depression    Diverticulosis of colon (without mention of hemorrhage)    GERD (gastroesophageal reflux disease)    History of chicken pox    History of shingles may 2013   HLD (hyperlipidemia)    HTN (hypertension)    Irritable bowel syndrome    Unspecified hypothyroidism    Past Surgical History:  Procedure Laterality Date   ABDOMINAL HYSTERECTOMY  1983   due to heavy bleeding & ovary cyst   APPENDECTOMY  1974   BLADDER DIVERTICULECTOMY  1990   CESAREAN SECTION     x 2   CHOLECYSTECTOMY     DILATION AND CURETTAGE OF UTERUS     x 2   DIRECT LARYNGOSCOPY Right 06/11/2015   Procedure: suspension microdirect laryngoscopy with  biopsy of right tongue base;  Surgeon: Bud Face, MD;  Location: [facility] ORS;  Service: ENT;  Laterality: Right;   NASAL SEPTUM SURGERY  1999   PARTIAL HYSTERECTOMY  1983   cyst on ovary and heavy bleeding    Family History  Problem Relation Age of Onset   Arthritis Mother    Hypertension Mother    Hyperlipidemia Mother    Atrial fibrillation Mother    Heart disease Mother    Diabetes Mother    Colon polyps Mother    Hyperlipidemia Father    Heart disease Father        s/p CABG   Stroke Father    Rheumatic fever Sister        s/p valve replacement   Deep vein thrombosis Brother        after immobilization     Colon polyps Brother    Heart disease Brother    Diabetes Maternal Grandfather    Heart disease Brother    Diabetes Maternal Aunt    Diabetes Maternal Uncle    Breast cancer Neg Hx    Colon cancer Neg Hx    Esophageal cancer Neg Hx    Rectal cancer Neg Hx    Stomach cancer Neg Hx    Social History   Socioeconomic History   Marital status: Single    Spouse name: Not on file   Number of children: 2   Years of education: Not on file   Highest education level: Not on file  Occupational History   Occupation: retired    Comment: Primary school teacher products   Tobacco Use   Smoking status: Never   Smokeless tobacco: Never  Substance and Sexual Activity   Alcohol use: No    Alcohol/week: 0.0 standard drinks of alcohol   Drug use: No   Sexual activity: Not on file  Other Topics Concern   Not on file  Social History Narrative   No regular exercise   Lives in noisy environment- very stressful    Daily caffeine use    Social Determinants of Health   Financial Resource Strain: Low Risk  (09/04/2023)   Overall Financial Resource Strain (CARDIA)    Difficulty of Paying Living Expenses: Not hard at all  Food Insecurity: No Food Insecurity (09/04/2023)   Hunger Vital Sign    Worried About Running Out of Food in the Last Year: Never true    Ran Out of Food in the Last Year: Never true  Transportation Needs: No Transportation Needs (09/04/2023)   PRAPARE - Administrator, Civil Service (Medical): No    Lack of Transportation (Non-Medical): No  Physical Activity: Inactive (09/04/2023)   Exercise Vital Sign    Days of Exercise per Week: 0 days    Minutes of Exercise per Session: 0 min  Stress: No Stress Concern Present (09/04/2023)   [employer] of Occupational Health - Occupational Stress Questionnaire    Feeling of Stress : Not at all  Social Connections: Moderately Isolated (09/04/2023)   Social Connection and Isolation Panel [NHANES]    Frequency of Communication with  Friends and Family: More than three times a week    Frequency of Social Gatherings with Friends and Family: More than three times a week    Attends Religious Services: More than 4 times per year    Active Member of [employer] or Organizations: No    Attends Banker Meetings: Never    Marital Status: Divorced  Tobacco Counseling Counseling given: Not Answered   Clinical Intake:  Pre-visit preparation completed: Yes  Pain : No/denies pain     BMI - recorded: 30.82 Nutritional Status: BMI > 30  Obese Nutritional Risks: Nausea/ vomitting/ diarrhea (IBS) Diabetes: No  How often do you need to have someone help you when you read instructions, pamphlets, or other written materials from your doctor or pharmacy?: 1 - Never  Interpreter Needed?: No  Information entered by :: R. Jojo Geving LPN   Activities of Daily Living    09/04/2023    9:03 AM  In your present state of health, do you have any difficulty performing the following activities:  Hearing? 0  Vision? 0  Comment glasses  Difficulty concentrating or making decisions? 0  Walking or climbing stairs? 0  Dressing or bathing? 0  Doing errands, shopping? 0  Preparing Food and eating ? N  Using the Toilet? N  In the past six months, have you accidently leaked urine? N  Do you have problems with loss of bowel control? N  Managing your Medications? N  Managing your Finances? N  Housekeeping or managing your Housekeeping? N    Patient Care Team: Dale Macomb, MD as PCP - General (Internal Medicine)  Indicate any recent Medical Services you may have received from other than [facility] providers in the past year (date may be approximate).     Assessment:   This is a routine wellness examination for Jill Torres.  Hearing/Vision screen Hearing Screening - Comments:: No issues Vision Screening - Comments:: glasses   Goals Addressed             This Visit's Progress    Patient Stated       Wants to get a total gym  and use at home       Depression Screen    09/04/2023    9:08 AM 12/22/2022   10:55 AM 07/22/2022    7:11 AM 03/22/2022    7:36 AM 02/10/2022    8:38 AM 09/14/2021    1:39 PM 07/15/2021    7:28 AM  PHQ 2/9 Scores  PHQ - 2 Score 0 0 0 0 0 0 0  PHQ- 9 Score 0          Fall Risk    09/04/2023    9:04 AM 12/22/2022   10:55 AM 07/22/2022    7:11 AM 03/22/2022    7:36 AM 02/10/2022    8:40 AM  Fall Risk   Falls in the past year? 0 0 0 0 0  Number falls in past yr: 0 0 0    Injury with Fall? 0 0 0    Risk for fall due to : No Fall Risks No Fall Risks No Fall Risks No Fall Risks   Follow up Falls prevention discussed;Falls evaluation completed Falls evaluation completed Falls evaluation completed Falls evaluation completed Falls evaluation completed    MEDICARE RISK AT HOME: Medicare Risk at Home Any stairs in or around the home?: Yes If so, are there any without handrails?: No Home free of loose throw rugs in walkways, pet beds, electrical cords, etc?: Yes Adequate lighting in your home to reduce risk of falls?: Yes Life alert?: No Use of a cane, walker or w/c?: No Grab bars in the bathroom?: Yes Shower chair or bench in shower?: No Elevated toilet seat or a handicapped toilet?: No      Cognitive Function:        09/04/2023    9:13  AM 02/10/2022    8:40 AM 02/07/2020    8:46 AM  6CIT Screen  What Year? 0 points 0 points 0 points  What month? 0 points 0 points 0 points  What time? 0 points 0 points 0 points  Count back from 20 0 points  0 points  Months in reverse 0 points 0 points 0 points  Repeat phrase 0 points  0 points  Total Score 0 points  0 points    Immunizations Immunization History  Administered Date(s) Administered   Fluad Quad(high Dose 65+) 09/16/2021, 10/27/2022   Fluad Trivalent(High Dose 65+) 08/29/2023   Influenza Split 08/29/2014   Influenza, High Dose Seasonal PF 08/16/2018, 09/30/2020   Influenza,inj,Quad PF,6+ Mos 08/13/2019    Influenza-Unspecified 08/27/2015, 08/21/2017   PFIZER(Purple Top)SARS-COV-2 Vaccination 04/23/2020, 05/14/2020   Pneumococcal Conjugate-13 04/11/2018   Pneumococcal Polysaccharide-23 07/26/2019   Tdap 04/18/2011    TDAP status: Due, Education has been provided regarding the importance of this vaccine. Advised may receive this vaccine at local pharmacy or Health Dept. Aware to provide a copy of the vaccination record if obtained from local pharmacy or Health Dept. Verbalized acceptance and understanding.  Flu Vaccine status: Up to date  Pneumococcal vaccine status: Up to date  Covid-19 vaccine status: Information provided on how to obtain vaccines.   Qualifies for Shingles Vaccine? Yes   Zostavax completed No   Shingrix Completed?: No.    Education has been provided regarding the importance of this vaccine. Patient has been advised to call insurance company to determine out of pocket expense if they have not yet received this vaccine. Advised may also receive vaccine at local pharmacy or Health Dept. Verbalized acceptance and understanding.  Screening Tests Health Maintenance  Topic Date Due   Zoster Vaccines- Shingrix (1 of 2) Never done   Medicare Annual Wellness (AWV)  02/11/2023   MAMMOGRAM  11/18/2023   Colonoscopy  02/21/2029   Pneumonia Vaccine 61+ Years old  Completed   INFLUENZA VACCINE  Completed   DEXA SCAN  Completed   Hepatitis C Screening  Completed   HPV VACCINES  Aged Out   DTaP/Tdap/Td  Discontinued   COVID-19 Vaccine  Discontinued    Health Maintenance  Health Maintenance Due  Topic Date Due   Zoster Vaccines- Shingrix (1 of 2) Never done   Medicare Annual Wellness (AWV)  02/11/2023    Colorectal cancer screening: Type of screening: Colonoscopy. Completed 02/2019. Repeat every 10 years  Mammogram status: Completed 08/2023. Repeat every year Ordered was placed at last office visit  Bone Density status: Completed 10/2019. Results reflect: Bone density  results: OSTEOPENIA. Repeat every 2 years. Order placed today  Lung Cancer Screening: (Low Dose CT Chest recommended if Age 55-80 years, 20 pack-year currently smoking OR have quit w/in 15years.) does not qualify.     Additional Screening:  Hepatitis C Screening: does qualify; Completed 12/2019  Vision Screening: Recommended annual ophthalmology exams for early detection of glaucoma and other disorders of the eye. Is the patient up to date with their annual eye exam?  Yes  Who is the provider or what is the name of the office in which the patient attends annual eye exams? Dr. Larence Penning If pt is not established with a provider, would they like to be referred to a provider to establish care? No .   Dental Screening: Recommended annual dental exams for proper oral hygiene    Community Resource Referral / Chronic Care Management: CRR required this visit?  No  CCM required this visit?  No     Plan:     I have personally reviewed and noted the following in the patient's chart:   Medical and social history Use of alcohol, tobacco or illicit drugs  Current medications and supplements including opioid prescriptions. Patient is not currently taking opioid prescriptions. Functional ability and status Nutritional status Physical activity Advanced directives List of other physicians Hospitalizations, surgeries, and ER visits in previous 12 months Vitals Screenings to include cognitive, depression, and falls Referrals and appointments  In addition, I have reviewed and discussed with patient certain preventive protocols, quality metrics, and best practice recommendations. A written personalized care plan for preventive services as well as general preventive health recommendations were provided to patient.     Sydell Axon, LPN   1/61/0960   After Visit Summary: (MyChart) Due to this being a telephonic visit, the after visit summary with patients personalized plan was offered to patient  via MyChart   Nurse Notes: None

## 2023-09-04 NOTE — Patient Instructions (Addendum)
Ms. Gilleo , Thank you for taking time to come for your Medicare Wellness Visit. I appreciate your ongoing commitment to your health goals. Please review the following plan we discussed and let me know if I can assist you in the future.   Referrals/Orders/Follow-Ups/Clinician Recommendations: Done Density  You have an order for:  []   2D Mammogram  []   3D Mammogram  [x]   Bone Density     Please call for appointment:  [facility]  [address] [place] [address] [phone]   Make sure to wear two-piece clothing.  No lotions, powders, or deodorants the day of the appointment. Make sure to bring picture ID and insurance card.  Bring list of medications you are currently taking including any supplements.   Schedule your [place] screening mammogram through MyChart!   Log into your MyChart account.  Go to 'Visit' (or 'Appointments' if on mobile App) --> Schedule an Appointment  Under 'Select a Reason for Visit' choose the Mammogram Screening option.  Complete the pre-visit questions and select the time and place that best fits your schedule.    This is a list of the screening recommended for you and due dates:  Health Maintenance  Topic Date Due   Zoster (Shingles) Vaccine (1 of 2) Never done   Mammogram  11/18/2023   Medicare Annual Wellness Visit  09/03/2024   Colon Cancer Screening  02/21/2029   Pneumonia Vaccine  Completed   Flu Shot  Completed   DEXA scan (bone density measurement)  Completed   Hepatitis C Screening  Completed   HPV Vaccine  Aged Out   DTaP/Tdap/Td vaccine  Discontinued   COVID-19 Vaccine  Discontinued    Advanced directives: (Declined) Advance directive discussed with you today. Even though you declined this today, please call our office should you change your mind, and we can give you the proper paperwork for you to fill out.  Next Medicare Annual Wellness Visit scheduled for next  year: Yes 09/09/24 @ 9:00

## 2023-09-20 ENCOUNTER — Other Ambulatory Visit: Payer: Self-pay | Admitting: Internal Medicine

## 2023-09-26 ENCOUNTER — Encounter: Payer: Self-pay | Admitting: Internal Medicine

## 2023-09-26 ENCOUNTER — Ambulatory Visit: Payer: Medicare HMO | Admitting: Internal Medicine

## 2023-09-26 VITALS — BP 118/70 | HR 71 | Temp 98.3°F | Resp 16 | Ht 63.0 in | Wt 178.0 lb

## 2023-09-26 DIAGNOSIS — I10 Essential (primary) hypertension: Secondary | ICD-10-CM | POA: Diagnosis not present

## 2023-09-26 DIAGNOSIS — H60503 Unspecified acute noninfective otitis externa, bilateral: Secondary | ICD-10-CM

## 2023-09-26 DIAGNOSIS — J019 Acute sinusitis, unspecified: Secondary | ICD-10-CM

## 2023-09-26 MED ORDER — AMOXICILLIN-POT CLAVULANATE 875-125 MG PO TABS: 1.0000 | ORAL_TABLET | Freq: Two times a day (BID) | ORAL | 0 refills | Status: DC

## 2023-09-26 MED ORDER — NEOMYCIN-POLYMYXIN-HC 3.5-10000-1 OT SOLN: 3.0000 [drp] | Freq: Four times a day (QID) | OTIC | 0 refills | Status: DC

## 2023-09-26 NOTE — Assessment & Plan Note (Signed)
Increased sinus pressure and congestion as outlined.  Continue nasal sprays - steroid nasal spray and saline nasal spray as directed.  Continue mucinex.  Add augmentin.  Follow.  Call with update. Cortisporin otic as directed to left ear.  Follow closely. Call with update.

## 2023-09-26 NOTE — Progress Notes (Signed)
Subjective:    Patient ID: Jill Torres, female    DOB: 01/30/1951, 72 y.o.   MRN: [id number]  Patient here for work in appt  HPI Work in with concerns regarding possible sinus infection.  Has some allergy issues.  Usually controls with astelin and flonase. Reports has continued to have some allergy issues.  Has noticed over the last few days, increased sinus pressure and increased drainage.  Some nasal congestion.  Cough up green mucus - from drainage.  No chest congestion.  No chest pain or sob.  No sore throat.  Left ear - feels shut.  Can hear.  Some pain - right ear.  No fever.  No nausea or vomiting.    Past Medical History:  Diagnosis Date   Allergy    Arthritis    Depression    Diverticulosis of colon (without mention of hemorrhage)    GERD (gastroesophageal reflux disease)    History of chicken pox    History of shingles may 2013   HLD (hyperlipidemia)    HTN (hypertension)    Irritable bowel syndrome    Unspecified hypothyroidism    Past Surgical History:  Procedure Laterality Date   ABDOMINAL HYSTERECTOMY  1983   due to heavy bleeding & ovary cyst   APPENDECTOMY  1974   BLADDER DIVERTICULECTOMY  1990   CESAREAN SECTION     x 2   CHOLECYSTECTOMY     DILATION AND CURETTAGE OF UTERUS     x 2   DIRECT LARYNGOSCOPY Right 06/11/2015   Procedure: suspension microdirect laryngoscopy with biopsy of right tongue base;  Surgeon: Bud Face, MD;  Location: [facility];  Service: ENT;  Laterality: Right;   NASAL SEPTUM SURGERY  1999   PARTIAL HYSTERECTOMY  1983   cyst on ovary and heavy bleeding    Family History  Problem Relation Age of Onset   Arthritis Mother    Hypertension Mother    Hyperlipidemia Mother    Atrial fibrillation Mother    Heart disease Mother    Diabetes Mother    Colon polyps Mother    Hyperlipidemia Father    Heart disease Father        s/p CABG   Stroke Father    Rheumatic fever Sister        s/p valve replacement   Deep vein  thrombosis Brother        after immobilization    Colon polyps Brother    Heart disease Brother    Diabetes Maternal Grandfather    Heart disease Brother    Diabetes Maternal Aunt    Diabetes Maternal Uncle    Breast cancer Neg Hx    Colon cancer Neg Hx    Esophageal cancer Neg Hx    Rectal cancer Neg Hx    Stomach cancer Neg Hx    Social History   Socioeconomic History   Marital status: Single    Spouse name: Not on file   Number of children: 2   Years of education: Not on file   Highest education level: Not on file  Occupational History   Occupation: retired    Comment: Primary school teacher products   Tobacco Use   Smoking status: Never   Smokeless tobacco: Never  Substance and Sexual Activity   Alcohol use: No    Alcohol/week: 0.0 standard drinks of alcohol   Drug use: No   Sexual activity: Not on file  Other Topics Concern   Not on file  Social History Narrative   No regular exercise   Lives in noisy environment- very stressful    Daily caffeine use    Social Determinants of Health   Financial Resource Strain: Low Risk  (09/04/2023)   Overall Financial Resource Strain (CARDIA)    Difficulty of Paying Living Expenses: Not hard at all  Food Insecurity: No Food Insecurity (09/04/2023)   Hunger Vital Sign    Worried About Running Out of Food in the Last Year: Never true    Ran Out of Food in the Last Year: Never true  Transportation Needs: No Transportation Needs (09/04/2023)   PRAPARE - Administrator, Civil Service (Medical): No    Lack of Transportation (Non-Medical): No  Physical Activity: Inactive (09/04/2023)   Exercise Vital Sign    Days of Exercise per Week: 0 days    Minutes of Exercise per Session: 0 min  Stress: No Stress Concern Present (09/04/2023)   [employer] of Occupational Health - Occupational Stress Questionnaire    Feeling of Stress : Not at all  Social Connections: Moderately Isolated (09/04/2023)   Social Connection and  Isolation Panel [NHANES]    Frequency of Communication with Friends and Family: More than three times a week    Frequency of Social Gatherings with Friends and Family: More than three times a week    Attends Religious Services: More than 4 times per year    Active Member of [employer] or Organizations: No    Attends Banker Meetings: Never    Marital Status: Divorced     Review of Systems  Constitutional:  Negative for appetite change and fever.  HENT:  Positive for congestion, postnasal drip and sinus pressure. Negative for sore throat.   Respiratory:  Negative for cough, chest tightness and shortness of breath.   Cardiovascular:  Negative for chest pain and palpitations.  Gastrointestinal:  Negative for abdominal pain, diarrhea, nausea and vomiting.  Genitourinary:  Negative for difficulty urinating and dysuria.  Musculoskeletal:  Negative for joint swelling and myalgias.  Skin:  Negative for color change and rash.  Neurological:  Negative for dizziness and headaches.  Psychiatric/Behavioral:  Negative for agitation and dysphoric mood.        Objective:     BP 118/70   Pulse 71   Temp 98.3 F (36.8 C)   Resp 16   Ht 5\' 3"  (1.6 m)   Wt 178 lb (80.7 kg)   LMP 12/12/1981   SpO2 98%   BMI 31.53 kg/m  Wt Readings from Last 3 Encounters:  09/26/23 178 lb (80.7 kg)  09/04/23 174 lb (78.9 kg)  08/29/23 174 lb (78.9 kg)    Physical Exam Vitals reviewed.  Constitutional:      General: She is not in acute distress.    Appearance: Normal appearance.  HENT:     Head: Normocephalic and atraumatic.     Right Ear: Tympanic membrane, ear canal and external ear normal.     Left Ear: Tympanic membrane and external ear normal.     Ears:     Comments: Left ear canal - some increased inflammation - no increased erythema.      Mouth/Throat:     Pharynx: No oropharyngeal exudate or posterior oropharyngeal erythema.  Eyes:     General: No scleral icterus.       Right eye:  No discharge.        Left eye: No discharge.     Conjunctiva/sclera: Conjunctivae normal.  Neck:     Thyroid: No thyromegaly.  Cardiovascular:     Rate and Rhythm: Normal rate and regular rhythm.  Pulmonary:     Effort: No respiratory distress.     Breath sounds: Normal breath sounds. No wheezing.  Abdominal:     General: Bowel sounds are normal.     Palpations: Abdomen is soft.     Tenderness: There is no abdominal tenderness.  Musculoskeletal:        General: No swelling or tenderness.     Cervical back: Neck supple. No tenderness.  Lymphadenopathy:     Cervical: No cervical adenopathy.  Skin:    Findings: No erythema or rash.  Neurological:     Mental Status: She is alert.  Psychiatric:        Mood and Affect: Mood normal.        Behavior: Behavior normal.      Outpatient Encounter Medications as of 09/26/2023  Medication Sig   amoxicillin-clavulanate (AUGMENTIN) 875-125 MG tablet Take 1 tablet by mouth 2 (two) times daily.   neomycin-polymyxin-hydrocortisone (CORTISPORIN) OTIC solution Place 3 drops into the left ear 4 (four) times daily.   amLODipine (NORVASC) 2.5 MG tablet Take 1 tablet (2.5 mg total) by mouth daily.   Ascorbic Acid (VITAMIN C) 1000 MG tablet Take 1,000 mg by mouth daily.   Azelastine HCl 137 MCG/SPRAY SOLN PLACE 1 SPRAY INTO BOTH NOSTRILS 2 (TWO) TIMES DAILY. USE IN EACH NOSTRIL AS DIRECTED   bifidobacterium infantis (ALIGN) capsule Take 1 capsule by mouth daily.   calcium-vitamin D (OSCAL WITH D 500-200) 500-200 MG-UNIT per tablet Take 1 tablet by mouth daily.   Coenzyme Q10 (COQ10 PO) Take by mouth daily.   fish oil-omega-3 fatty acids 1000 MG capsule Take 2 g by mouth daily.   fluticasone (FLONASE) 50 MCG/ACT nasal spray Place 2 sprays into both nostrils daily.   multivitamin (THERAGRAN) per tablet Take 1 tablet by mouth daily.   polyethylene glycol powder (GLYCOLAX/MIRALAX) 17 GM/SCOOP powder Take 17 g by mouth as needed.   psyllium (REGULOID)  0.52 g capsule Take 0.52 g by mouth daily as needed.   rosuvastatin (CRESTOR) 20 MG tablet Take 1 tablet (20 mg total) by mouth daily.   No facility-administered encounter medications on file as of 09/26/2023.     Lab Results  Component Value Date   WBC 10.4 02/15/2023   HGB 13.3 02/15/2023   HCT 39.8 02/15/2023   PLT 346.0 02/15/2023   GLUCOSE 93 08/29/2023   CHOL 158 08/29/2023   TRIG 167.0 (H) 08/29/2023   HDL 64.90 08/29/2023   LDLDIRECT 81.0 07/22/2022   LDLCALC 59 08/29/2023   ALT 12 08/29/2023   AST 15 08/29/2023   NA 138 08/29/2023   K 4.3 08/29/2023   CL 103 08/29/2023   CREATININE 0.99 08/29/2023   BUN 12 08/29/2023   CO2 27 08/29/2023   TSH 1.12 04/25/2023    CT Abdomen Pelvis Wo Contrast  Result Date: 02/17/2023 CLINICAL DATA:  72 year old female with acute LEFT abdominal and pelvic pain. EXAM: CT ABDOMEN AND PELVIS WITHOUT CONTRAST TECHNIQUE: Multidetector CT imaging of the abdomen and pelvis was performed following the standard protocol without IV contrast. RADIATION DOSE REDUCTION: This exam was performed according to the departmental dose-optimization program which includes automated exposure control, adjustment of the mA and/or kV according to patient size and/or use of iterative reconstruction technique. COMPARISON:  08/09/2019 CT FINDINGS: Please note that parenchymal and vascular abnormalities may be missed as intravenous contrast  was not administered. Lower chest: No acute abnormality. Hepatobiliary: The liver is unremarkable. The patient is status post cholecystectomy. There is no evidence of intrahepatic or extrahepatic biliary dilatation. Pancreas: Unremarkable Spleen: Unremarkable Adrenals/Urinary Tract: The kidneys, adrenal glands and bladder are unremarkable. Stomach/Bowel: Stomach is within normal limits. No evidence of bowel wall thickening, distention, or inflammatory changes. Colonic diverticulosis identified without evidence of acute diverticulitis.  Vascular/Lymphatic: Aortic atherosclerosis. No enlarged abdominal or pelvic lymph nodes. Reproductive: Status post hysterectomy. No adnexal masses. Other: No ascites, focal collection or pneumoperitoneum. Musculoskeletal: No acute or suspicious bony abnormalities are noted. Mild degenerative disc disease in the LOWER lumbar spine again noted. IMPRESSION: 1. No evidence of acute abnormality. 2.  Aortic Atherosclerosis (ICD10-I70.0). Electronically Signed   By: Harmon Pier M.D.   On: 02/17/2023 14:55       Assessment & Plan:  Acute sinusitis, recurrence not specified, unspecified location Assessment & Plan: Increased sinus pressure and congestion as outlined.  Continue nasal sprays - steroid nasal spray and saline nasal spray as directed.  Continue mucinex.  Add augmentin.  Follow.  Call with update. Cortisporin otic as directed to left ear.  Follow closely. Call with update.    Acute otitis externa of both ears, unspecified type Assessment & Plan: Left ear - canal - minimal swelling.  Cortisporin otic as directed.  Follow.    Hypertension, unspecified type Assessment & Plan: Continue amlodipine.  Follow pressures.  Follow metabolic panel.    Other orders -     Amoxicillin-Pot Clavulanate; Take 1 tablet by mouth 2 (two) times daily.  Dispense: 20 tablet; Refill: 0 -     Neomycin-Polymyxin-HC; Place 3 drops into the left ear 4 (four) times daily.  Dispense: 10 mL; Refill: 0     Dale Rockledge, MD

## 2023-09-26 NOTE — Assessment & Plan Note (Signed)
Left ear - canal - minimal swelling.  Cortisporin otic as directed.  Follow.

## 2023-09-26 NOTE — Assessment & Plan Note (Addendum)
Continue amlodipine.  Follow pressures.  Follow metabolic panel.

## 2023-09-26 NOTE — Assessment & Plan Note (Signed)
On thyroid replacement.  Follow tsh.  

## 2023-11-21 ENCOUNTER — Ambulatory Visit
Admission: RE | Admit: 2023-11-21 | Discharge: 2023-11-21 | Disposition: A | Discharge status: Home / Self Care | Payer: Medicare HMO | Source: Ambulatory Visit | Attending: Internal Medicine | Admitting: Internal Medicine

## 2023-11-21 DIAGNOSIS — Z780 Asymptomatic menopausal state: Secondary | ICD-10-CM | POA: Insufficient documentation

## 2023-11-21 DIAGNOSIS — M85852 Other specified disorders of bone density and structure, left thigh: Secondary | ICD-10-CM | POA: Diagnosis not present

## 2023-11-21 DIAGNOSIS — Z1231 Encounter for screening mammogram for malignant neoplasm of breast: Secondary | ICD-10-CM | POA: Insufficient documentation

## 2023-12-29 ENCOUNTER — Encounter: Payer: Self-pay | Admitting: Internal Medicine

## 2023-12-29 ENCOUNTER — Ambulatory Visit (INDEPENDENT_AMBULATORY_CARE_PROVIDER_SITE_OTHER): Payer: Medicare HMO | Admitting: Internal Medicine

## 2023-12-29 VITALS — BP 118/70 | HR 86 | Temp 98.2°F | Resp 16 | Ht 63.0 in | Wt 176.2 lb

## 2023-12-29 DIAGNOSIS — Z860100 Personal history of colon polyps, unspecified: Secondary | ICD-10-CM | POA: Diagnosis not present

## 2023-12-29 DIAGNOSIS — R5383 Other fatigue: Secondary | ICD-10-CM | POA: Diagnosis not present

## 2023-12-29 DIAGNOSIS — E039 Hypothyroidism, unspecified: Secondary | ICD-10-CM

## 2023-12-29 DIAGNOSIS — Z9109 Other allergy status, other than to drugs and biological substances: Secondary | ICD-10-CM

## 2023-12-29 DIAGNOSIS — R030 Elevated blood-pressure reading, without diagnosis of hypertension: Secondary | ICD-10-CM

## 2023-12-29 DIAGNOSIS — I10 Essential (primary) hypertension: Secondary | ICD-10-CM | POA: Diagnosis not present

## 2023-12-29 DIAGNOSIS — K589 Irritable bowel syndrome without diarrhea: Secondary | ICD-10-CM | POA: Diagnosis not present

## 2023-12-29 DIAGNOSIS — E7800 Pure hypercholesterolemia, unspecified: Secondary | ICD-10-CM

## 2023-12-29 DIAGNOSIS — M791 Myalgia, unspecified site: Secondary | ICD-10-CM | POA: Insufficient documentation

## 2023-12-29 LAB — BASIC METABOLIC PANEL
BUN: 11 mg/dL (ref 6–23)
CO2: 26 meq/L (ref 19–32)
Calcium: 9.4 mg/dL (ref 8.4–10.5)
Chloride: 105 meq/L (ref 96–112)
Creatinine, Ser: 0.97 mg/dL (ref 0.40–1.20)
GFR: 58.82 mL/min — ABNORMAL LOW (ref >60.00)
Glucose, Bld: 104 mg/dL — ABNORMAL HIGH (ref 70–99)
Potassium: 3.7 meq/L (ref 3.5–5.1)
Sodium: 140 meq/L (ref 135–145)

## 2023-12-29 LAB — LIPID PANEL
Cholesterol: 151 mg/dL (ref 0–200)
HDL: 62 mg/dL (ref >39.00)
LDL Cholesterol: 62 mg/dL (ref 0–99)
NonHDL: 89.42
Total CHOL/HDL Ratio: 2
Triglycerides: 135 mg/dL (ref 0.0–149.0)
VLDL: 27 mg/dL (ref 0.0–40.0)

## 2023-12-29 LAB — HEPATIC FUNCTION PANEL
ALT: 13 U/L (ref 0–35)
AST: 15 U/L (ref 0–37)
Albumin: 4.3 g/dL (ref 3.5–5.2)
Alkaline Phosphatase: 56 U/L (ref 39–117)
Bilirubin, Direct: 0.2 mg/dL (ref 0.0–0.3)
Total Bilirubin: 0.8 mg/dL (ref 0.2–1.2)
Total Protein: 7.4 g/dL (ref 6.0–8.3)

## 2023-12-29 LAB — SEDIMENTATION RATE: Sed Rate: 13 mm/h (ref 0–30)

## 2023-12-29 LAB — CK: Total CK: 62 U/L (ref 7–177)

## 2023-12-29 LAB — TSH: TSH: 0.82 u[IU]/mL (ref 0.35–5.50)

## 2023-12-29 MED ORDER — TIZANIDINE HCL 4 MG PO TABS: 4.0000 mg | ORAL_TABLET | Freq: Every evening | ORAL | 0 refills | Status: DC | PRN

## 2023-12-29 MED ORDER — ROSUVASTATIN CALCIUM 20 MG PO TABS: 20.0000 mg | ORAL_TABLET | Freq: Every day | ORAL | 3 refills | Status: DC

## 2023-12-29 MED ORDER — AMLODIPINE BESYLATE 2.5 MG PO TABS: 2.5000 mg | ORAL_TABLET | Freq: Every day | ORAL | 3 refills | Status: DC

## 2023-12-29 NOTE — Assessment & Plan Note (Signed)
Continue amlodipine.  Follow pressures.  Follow metabolic panel.   

## 2023-12-29 NOTE — Assessment & Plan Note (Signed)
On crestor.  Low cholesterol diet and exercise.  Follow lipid panel and liver function tests.

## 2023-12-29 NOTE — Assessment & Plan Note (Signed)
02/22/19 - cecal polyp.  Recommended f/u colonoscopy in 10 years.   ?

## 2023-12-29 NOTE — Assessment & Plan Note (Signed)
Persistent drainage and congestion as outlined. Continue current regimen.  Given persistnet symptoms despite medication, refer to ENT for evaluation.  Has seen Dr Jenne Campus.

## 2023-12-29 NOTE — Progress Notes (Unsigned)
Subjective:    Patient ID: Jill Torres, female    DOB: [birth year], 73 y.o.   MRN: [id number]  Patient here for  Chief Complaint  Patient presents with   Medical Management of Chronic Issues    HPI Here to follow up regarding hypercholesterolemia and hypertension. Reported history of IBS. Saw GI 05/04/23 - recommended hydrogen breath test. Fiber change. Taking metamucil. Breath test per her report negative.still with loose stool in am. Fiber has not helped. Wants to hold on f/u with GI at this time. Reports increased fatigue. By afternoon - tired. Some muscle aching - arms and legs.  Discussed exercise. Had questions about her thyroid. No chest pain or sob reported. Also reports persistent drainage and nasal congestion. Persistent despite using antihistamine nasal spray, steroid nasal spray and saline. Discussed f/u with ENT.    Past Medical History:  Diagnosis Date   Allergy    Arthritis    Depression    Diverticulosis of colon (without mention of hemorrhage)    GERD (gastroesophageal reflux disease)    History of chicken pox    History of shingles may 2013   HLD (hyperlipidemia)    HTN (hypertension)    Irritable bowel syndrome    Unspecified hypothyroidism    Past Surgical History:  Procedure Laterality Date   ABDOMINAL HYSTERECTOMY  1983   due to heavy bleeding & ovary cyst   APPENDECTOMY  1974   BLADDER DIVERTICULECTOMY  1990   CESAREAN SECTION     x 2   CHOLECYSTECTOMY     DILATION AND CURETTAGE OF UTERUS     x 2   DIRECT LARYNGOSCOPY Right 06/11/2015   Procedure: suspension microdirect laryngoscopy with biopsy of right tongue base;  Surgeon: Bud Face, MD;  Location: [facility] ORS;  Service: ENT;  Laterality: Right;   NASAL SEPTUM SURGERY  1999   PARTIAL HYSTERECTOMY  1983   cyst on ovary and heavy bleeding    Family History  Problem Relation Age of Onset   Arthritis Mother    Hypertension Mother    Hyperlipidemia Mother    Atrial fibrillation Mother     Heart disease Mother    Diabetes Mother    Colon polyps Mother    Hyperlipidemia Father    Heart disease Father        s/p CABG   Stroke Father    Rheumatic fever Sister        s/p valve replacement   Deep vein thrombosis Brother        after immobilization    Colon polyps Brother    Heart disease Brother    Diabetes Maternal Grandfather    Heart disease Brother    Diabetes Maternal Aunt    Diabetes Maternal Uncle    Breast cancer Neg Hx    Colon cancer Neg Hx    Esophageal cancer Neg Hx    Rectal cancer Neg Hx    Stomach cancer Neg Hx    Social History   Socioeconomic History   Marital status: Single    Spouse name: Not on file   Number of children: 2   Years of education: Not on file   Highest education level: Not on file  Occupational History   Occupation: retired    Comment: Primary school teacher products   Tobacco Use   Smoking status: Never   Smokeless tobacco: Never  Substance and Sexual Activity   Alcohol use: No    Alcohol/week: 0.0 standard drinks of alcohol  Drug use: No   Sexual activity: Not on file  Other Topics Concern   Not on file  Social History Narrative   No regular exercise   Lives in noisy environment- very stressful    Daily caffeine use    Social Drivers of Health   Financial Resource Strain: Low Risk  (09/04/2023)   Overall Financial Resource Strain (CARDIA)    Difficulty of Paying Living Expenses: Not hard at all  Food Insecurity: No Food Insecurity (09/04/2023)   Hunger Vital Sign    Worried About Running Out of Food in the Last Year: Never true    Ran Out of Food in the Last Year: Never true  Transportation Needs: No Transportation Needs (09/04/2023)   PRAPARE - Administrator, Civil Service (Medical): No    Lack of Transportation (Non-Medical): No  Physical Activity: Inactive (09/04/2023)   Exercise Vital Sign    Days of Exercise per Week: 0 days    Minutes of Exercise per Session: 0 min  Stress: No Stress Concern Present  (09/04/2023)   [employer] of Occupational Health - Occupational Stress Questionnaire    Feeling of Stress : Not at all  Social Connections: Moderately Isolated (09/04/2023)   Social Connection and Isolation Panel [NHANES]    Frequency of Communication with Friends and Family: More than three times a week    Frequency of Social Gatherings with Friends and Family: More than three times a week    Attends Religious Services: More than 4 times per year    Active Member of [employer] or Organizations: No    Attends Banker Meetings: Never    Marital Status: Divorced     Review of Systems  Constitutional:  Positive for fatigue. Negative for appetite change and unexpected weight change.  HENT:  Positive for congestion and postnasal drip.   Respiratory:  Negative for cough, chest tightness and shortness of breath.   Cardiovascular:  Negative for chest pain, palpitations and leg swelling.  Gastrointestinal:  Negative for abdominal pain, diarrhea, nausea and vomiting.  Genitourinary:  Negative for difficulty urinating and dysuria.  Musculoskeletal:  Negative for joint swelling.       Muscle aches as outlined.   Skin:  Negative for color change and rash.  Neurological:  Negative for dizziness and headaches.  Psychiatric/Behavioral:  Negative for agitation and dysphoric mood.        Objective:     BP 118/70   Pulse 86   Temp 98.2 F (36.8 C)   Resp 16   Ht 5\' 3"  (1.6 m)   Wt 176 lb 3.2 oz (79.9 kg)   LMP 12/12/1981   SpO2 98%   BMI 31.21 kg/m  Wt Readings from Last 3 Encounters:  12/29/23 176 lb 3.2 oz (79.9 kg)  09/26/23 178 lb (80.7 kg)  09/04/23 174 lb (78.9 kg)    Physical Exam Vitals reviewed.  Constitutional:      General: She is not in acute distress.    Appearance: Normal appearance.  HENT:     Head: Normocephalic and atraumatic.     Right Ear: External ear normal.     Left Ear: External ear normal.     Mouth/Throat:     Pharynx: No oropharyngeal  exudate or posterior oropharyngeal erythema.  Eyes:     General: No scleral icterus.       Right eye: No discharge.        Left eye: No discharge.  Conjunctiva/sclera: Conjunctivae normal.  Neck:     Thyroid: No thyromegaly.  Cardiovascular:     Rate and Rhythm: Normal rate and regular rhythm.  Pulmonary:     Effort: No respiratory distress.     Breath sounds: Normal breath sounds. No wheezing.  Abdominal:     General: Bowel sounds are normal.     Palpations: Abdomen is soft.     Tenderness: There is no abdominal tenderness.  Musculoskeletal:        General: No swelling or tenderness.     Cervical back: Neck supple. No tenderness.     Comments: Good rom.   Lymphadenopathy:     Cervical: No cervical adenopathy.  Skin:    Findings: No erythema or rash.  Neurological:     Mental Status: She is alert.  Psychiatric:        Mood and Affect: Mood normal.        Behavior: Behavior normal.     {Perform Simple Foot Exam  Perform Detailed exam:1} {Insert foot Exam (Optional):30965}   Outpatient Encounter Medications as of 12/29/2023  Medication Sig   amLODipine (NORVASC) 2.5 MG tablet Take 1 tablet (2.5 mg total) by mouth daily.   Ascorbic Acid (VITAMIN C) 1000 MG tablet Take 1,000 mg by mouth daily.   Azelastine HCl 137 MCG/SPRAY SOLN PLACE 1 SPRAY INTO BOTH NOSTRILS 2 (TWO) TIMES DAILY. USE IN EACH NOSTRIL AS DIRECTED   bifidobacterium infantis (ALIGN) capsule Take 1 capsule by mouth daily.   calcium-vitamin D (OSCAL WITH D 500-200) 500-200 MG-UNIT per tablet Take 1 tablet by mouth daily.   Coenzyme Q10 (COQ10 PO) Take by mouth daily.   fish oil-omega-3 fatty acids 1000 MG capsule Take 2 g by mouth daily.   fluticasone (FLONASE) 50 MCG/ACT nasal spray Place 2 sprays into both nostrils daily.   multivitamin (THERAGRAN) per tablet Take 1 tablet by mouth daily.   neomycin-polymyxin-hydrocortisone (CORTISPORIN) OTIC solution Place 3 drops into the left ear 4 (four) times daily.    polyethylene glycol powder (GLYCOLAX/MIRALAX) 17 GM/SCOOP powder Take 17 g by mouth as needed.   psyllium (REGULOID) 0.52 g capsule Take 0.52 g by mouth daily as needed.   rosuvastatin (CRESTOR) 20 MG tablet Take 1 tablet (20 mg total) by mouth daily.   [DISCONTINUED] amLODipine (NORVASC) 2.5 MG tablet Take 1 tablet (2.5 mg total) by mouth daily.   [DISCONTINUED] amoxicillin-clavulanate (AUGMENTIN) 875-125 MG tablet Take 1 tablet by mouth 2 (two) times daily.   [DISCONTINUED] rosuvastatin (CRESTOR) 20 MG tablet Take 1 tablet (20 mg total) by mouth daily.   No facility-administered encounter medications on file as of 12/29/2023.     Lab Results  Component Value Date   WBC 10.4 02/15/2023   HGB 13.3 02/15/2023   HCT 39.8 02/15/2023   PLT 346.0 02/15/2023   GLUCOSE 93 08/29/2023   CHOL 158 08/29/2023   TRIG 167.0 (H) 08/29/2023   HDL 64.90 08/29/2023   LDLDIRECT 81.0 07/22/2022   LDLCALC 59 08/29/2023   ALT 12 08/29/2023   AST 15 08/29/2023   NA 138 08/29/2023   K 4.3 08/29/2023   CL 103 08/29/2023   CREATININE 0.99 08/29/2023   BUN 12 08/29/2023   CO2 27 08/29/2023   TSH 1.12 04/25/2023    MM 3D SCREENING MAMMOGRAM BILATERAL BREAST Result Date: 11/23/2023 CLINICAL DATA:  Screening. EXAM: DIGITAL SCREENING BILATERAL MAMMOGRAM WITH TOMOSYNTHESIS AND CAD TECHNIQUE: Bilateral screening digital craniocaudal and mediolateral oblique mammograms were obtained. Bilateral screening digital breast tomosynthesis  was performed. The images were evaluated with computer-aided detection. COMPARISON:  Previous exam(s). ACR Breast Density Category c: The breasts are heterogeneously dense, which may obscure small masses. FINDINGS: There are no findings suspicious for malignancy. IMPRESSION: No mammographic evidence of malignancy. A result letter of this screening mammogram will be mailed directly to the patient. RECOMMENDATION: Screening mammogram in one year. (Code:SM-B-01Y) BI-RADS CATEGORY  1:  Negative. Electronically Signed   By: Edwin Cap M.D.   On: 11/23/2023 08:50   DG Bone Density Result Date: 11/21/2023 EXAM: DUAL X-RAY ABSORPTIOMETRY (DXA) FOR BONE MINERAL DENSITY IMPRESSION: Your patient Tawanna Mukhopadhyay completed a BMD test on 11/21/2023 using the [employer] DXA System (software version: 14.10) manufactured by [employer]. The following summarizes the results of our evaluation. Technologist: SCE PATIENT BIOGRAPHICAL: Name: Daesha, Sutherby Patient ID: [id number] Birth Date: Apr 26, 1951 Height: 63.0 in. Gender: Female Exam Date: 11/21/2023 Weight: 177.9 lbs. Indications: Advanced Age, Caucasian, Family Hist. (Parent hip fracture), Height Loss, Hysterectomy, Oophorectomy Bilateral, Parent Hip Fracture, Postmenopausal Fractures: Treatments: Multi-Vitamin DENSITOMETRY RESULTS: Site      Region     Measured Date Measured Age WHO Classification Young Adult T-score BMD         %Change vs. Previous Significant Change (*) AP Spine L1-L4 11/21/2023 71.3 Normal -0.1 1.179 g/cm2 0.3% - AP Spine L1-L4 10/29/2019 67.2 Normal -0.2 1.175 g/cm2 - - DualFemur Neck Left 11/21/2023 71.3 Osteopenia -2.1 0.746 g/cm2 -3.9% - DualFemur Neck Left 10/29/2019 67.2 Osteopenia -1.9 0.776 g/cm2 - - DualFemur Total Mean 11/21/2023 71.3 Osteopenia -1.7 0.795 g/cm2 -2.5% Yes DualFemur Total Mean 10/29/2019 67.2 Osteopenia -1.5 0.815 g/cm2 - - ASSESSMENT: The BMD measured at Femur Neck Left is 0.746 g/cm2 with a T-score of -2.1. This patient's diagnostic category is LOW BONE MASS/OSTEOPENIA according to World Health Organization [facility]) criteria. The scan quality is good. Comparison to 10/29/2019. Since the prior study, there has been NO SIGNIFICANT CHANGE in bone mineral density of the lumbar spine or hips. World Science writer [facility]) criteria for post-menopausal, Caucasian Women: Normal:                   T-score at or above -1 SD Osteopenia/low bone mass: T-score between -1 and -2.5 SD Osteoporosis:              T-score at or below -2.5 SD RECOMMENDATIONS: 1. All patients should optimize calcium and vitamin D intake. 2. Consider FDA-approved medical therapies in postmenopausal women and men aged 26 years and older, based on the following: a. A hip or vertebral(clinical or morphometric) fracture b. T-score < -2.5 at the femoral neck or spine after appropriate evaluation to exclude secondary causes c. Low bone mass (T-score between -1.0 and -2.5 at the femoral neck or spine) and a 10-year probability of a hip fracture > 3% or a 10-year probability of a major osteoporosis-related fracture > 20% based on the [place]-adapted WHO algorithm 3. Clinician judgment and/or patient preferences may indicate treatment for people with 10-year fracture probabilities above or below these levels FOLLOW-UP: People with diagnosed cases of osteoporosis or at high risk for fracture should have regular bone mineral density tests. For patients eligible for Medicare, routine testing is allowed once every 2 years. The testing frequency can be increased to one year for patients who have rapidly progressing disease, those who are receiving or discontinuing medical therapy to restore bone mass, or have additional risk factors. I have reviewed this report, and agree with the above findings. [facility] Dear Dale Mendes, Your patient Mahogani Kleiber Kolker completed a FRAX assessment on 11/21/2023 using the [facility] iDXA DXA System (analysis version: 14.10) manufactured by [employer]. The following summarizes the results of our evaluation. PATIENT BIOGRAPHICAL: Name: Brianni, Gonzalo Patient ID: [id number] Birth Date: [birth year] Height:    63.0 in. Gender:     [age]    [age]       Weight:    177.9 lbs. Ethnicity:  White                            Exam Date: 11/21/2023 FRAX* RESULTS:  (version: 3.5) 10-year Probability of Fracture1 Major Osteoporotic Fracture2 Hip Fracture 19.7% 6.7% Population: [place] (Caucasian) Risk Factors:  Family Hist. (Parent hip fracture) Based on Femur (Left) Neck BMD 1 -The 10-year probability of fracture may be lower than reported if the patient has received treatment. 2 -Major Osteoporotic Fracture: Clinical Spine, Forearm, Hip or Shoulder *FRAX is a Armed forces logistics/support/administrative officer of the [employer] of [employer] for Metabolic Bone Disease, a World Science writer (WHO) [employer]. ASSESSMENT: The probability of a major osteoporotic fracture is 19.7% within the next ten years. The probability of a hip fracture is 6.7% within the next ten years. . Electronically Signed   By: Harmon Pier M.D.   On: 11/21/2023 10:17       Assessment & Plan:  Hypercholesterolemia Assessment & Plan: On crestor.  Low cholesterol diet and exercise.  Follow lipid panel and liver function tests.   Orders: -     Hepatic function panel -     Lipid panel  Hypothyroidism, unspecified type Assessment & Plan: On no medication.  Check tsh.    Hypertension, unspecified type Assessment & Plan: Continue amlodipine.  Follow pressures.  Follow metabolic panel.   Orders: -     Basic metabolic panel  Elevated blood pressure reading -     amLODIPine Besylate; Take 1 tablet (2.5 mg total) by mouth daily.  Dispense: 90 tablet; Refill: 3  Muscle ache -     Sedimentation rate -     CK  Other fatigue Assessment & Plan: Reports fatigue as outlined. Increased daytime somnolence. Discussed possible sleep apnea. Agreeable for referral.  Refer to pulmonary.   Orders: -     TSH  Environmental allergies Assessment & Plan: Persistent drainage and congestion as outlined. Continue current regimen.  Given persistnet symptoms despite medication, refer to ENT for evaluation.  Has seen Dr Jenne Campus.    History of colonic polyps Assessment & Plan: 02/22/19 - cecal polyp.  Recommended f/u colonoscopy in 10 years.     Irritable bowel syndrome without diarrhea Assessment & Plan: Bowel symptoms as outlined. Saw GI  as outlined.  Wants to hold on f/u with GI at this time.    Other orders -     Rosuvastatin Calcium; Take 1 tablet (20 mg total) by mouth daily.  Dispense: 90 tablet; Refill: 3     Dale [place], MD

## 2023-12-29 NOTE — Assessment & Plan Note (Signed)
Bowel symptoms as outlined. Saw GI as outlined.  Wants to hold on f/u with GI at this time.

## 2023-12-29 NOTE — Assessment & Plan Note (Signed)
Reports fatigue as outlined. Increased daytime somnolence. Discussed possible sleep apnea. Agreeable for referral.  Refer to pulmonary.

## 2023-12-29 NOTE — Assessment & Plan Note (Signed)
On no medication.  Check tsh.

## 2023-12-31 ENCOUNTER — Encounter: Payer: Self-pay | Admitting: Internal Medicine

## 2023-12-31 NOTE — Assessment & Plan Note (Signed)
Muscle aching as outlined. Arms and legs. Discussed stretches/exercise. Check routine labs along with CK and esr. Follow.

## 2024-01-16 ENCOUNTER — Ambulatory Visit: Payer: Medicare HMO | Admitting: Sleep Medicine

## 2024-01-16 ENCOUNTER — Encounter: Payer: Self-pay | Admitting: Sleep Medicine

## 2024-01-16 VITALS — BP 146/70 | HR 71 | Temp 97.6°F | Ht 63.0 in | Wt 179.4 lb

## 2024-01-16 DIAGNOSIS — G4710 Hypersomnia, unspecified: Secondary | ICD-10-CM | POA: Diagnosis not present

## 2024-01-16 DIAGNOSIS — G4733 Obstructive sleep apnea (adult) (pediatric): Secondary | ICD-10-CM

## 2024-01-16 DIAGNOSIS — I10 Essential (primary) hypertension: Secondary | ICD-10-CM

## 2024-01-16 DIAGNOSIS — E662 Morbid (severe) obesity with alveolar hypoventilation: Secondary | ICD-10-CM

## 2024-01-16 DIAGNOSIS — E785 Hyperlipidemia, unspecified: Secondary | ICD-10-CM | POA: Diagnosis not present

## 2024-01-16 NOTE — Patient Instructions (Signed)
Will complete a home sleep study and follow up to review results. Do not drive drowsy for safety of yourself and others.

## 2024-01-16 NOTE — Progress Notes (Signed)
 Name:Jill Torres MRN: [id number] DOB: 02/13/1951   CHIEF COMPLAINT:  EXCESSIVE DAYTIME SLEEPINESS   HISTORY OF PRESENT ILLNESS:  Jill Torres is a 73 y.o. w/ a h/o HTN, allergic rhinitis, hyperlipidemia and obesity who presents for c/o excessive daytime sleepiness which has been present for several years. Reports occasional nocturnal awakenings due to gasping. Patient does not have a bed partner, unsure if she snores. Denies any significant weight changes. Admits to dry mouth and headaches. Denies RLS symptoms or dream enactment. Reports a family history of sleep apnea. Denies drowsy driving. Drinks 1 cup of coffee daily, denies alcohol, tobacco or illicit drug use.   Bedtime 9:30-10:30 pm Sleep onset 30 mins Rise time 6:30 am   EPWORTH SLEEP SCORE 7    01/16/2024   10:16 AM  Results of the Epworth flowsheet  Sitting and reading 2  Watching TV 2  Sitting, inactive in a public place (e.g. a theatre or a meeting) 0  As a passenger in a car for an hour without a break 1  Lying down to rest in the afternoon when circumstances permit 2  Sitting and talking to someone 0  Sitting quietly after a lunch without alcohol 0  In a car, while stopped for a few minutes in traffic 0  Total score 7     PAST MEDICAL HISTORY :   has a past medical history of Allergy, Arthritis, Depression, Diverticulosis of colon (without mention of hemorrhage), GERD (gastroesophageal reflux disease), History of chicken pox, History of shingles (may 2013), HLD (hyperlipidemia), HTN (hypertension), Irritable bowel syndrome, and Unspecified hypothyroidism.  has a past surgical history that includes Cesarean section; Cholecystectomy; Dilation and curettage of uterus; Partial hysterectomy (1983); Bladder diverticulectomy (1990); Nasal septum surgery (1999); Appendectomy (1974); Abdominal hysterectomy (1983); and Direct laryngoscopy (Right, 06/11/2015). Prior to Admission medications   Medication Sig Start  Date End Date Taking? Authorizing Provider  amLODipine  (NORVASC ) 2.5 MG tablet Take 1 tablet (2.5 mg total) by mouth daily. 12/29/23  Yes Glendia Shad, MD  Ascorbic Acid (VITAMIN C) 1000 MG tablet Take 1,000 mg by mouth daily.   Yes [provider]  Azelastine  HCl 137 MCG/SPRAY SOLN PLACE 1 SPRAY INTO BOTH NOSTRILS 2 (TWO) TIMES DAILY. USE IN EACH NOSTRIL AS DIRECTED 09/20/23  Yes Glendia Shad, MD  bifidobacterium infantis (ALIGN) capsule Take 1 capsule by mouth daily.   Yes [provider]  calcium -vitamin D (OSCAL WITH D 500-200) 500-200 MG-UNIT per tablet Take 1 tablet by mouth daily.   Yes [provider]  Coenzyme Q10 (COQ10 PO) Take by mouth daily.   Yes [provider]  fish oil-omega-3 fatty acids 1000 MG capsule Take 2 g by mouth daily.   Yes [provider]  fluticasone  (FLONASE ) 50 MCG/ACT nasal spray Place 2 sprays into both nostrils daily. 08/29/23  Yes Glendia Shad, MD  multivitamin (THERAGRAN) per tablet Take 1 tablet by mouth daily.   Yes [provider]  polyethylene glycol powder (GLYCOLAX/MIRALAX) 17 GM/SCOOP powder Take 17 g by mouth as needed.   Yes [provider]  psyllium (REGULOID) 0.52 g capsule Take 0.52 g by mouth daily as needed.   Yes [provider]  rosuvastatin  (CRESTOR ) 20 MG tablet Take 1 tablet (20 mg total) by mouth daily. 12/29/23  Yes Glendia Shad, MD   No Known Allergies  FAMILY HISTORY:  family history includes Arthritis in her mother; Atrial fibrillation in her mother; Colon polyps in her brother and  mother; Deep vein thrombosis in her brother; Diabetes in her maternal aunt, maternal grandfather, maternal uncle, and mother; Heart disease in her brother, brother, father, and mother; Hyperlipidemia in her father and mother; Hypertension in her mother; Rheumatic fever in her sister; Stroke in her father. SOCIAL HISTORY:  reports that she has never smoked. She has never used  smokeless tobacco. She reports that she does not drink alcohol and does not use drugs.   Review of Systems:  Gen:  Denies  fever, sweats, chills weight loss  HEENT: Denies blurred vision, double vision, ear pain, eye pain, hearing loss, nose bleeds, sore throat Cardiac:  No dizziness, chest pain or heaviness, chest tightness,edema, No JVD Resp:   No cough, -sputum production, -shortness of breath,-wheezing, -hemoptysis,  Gi: Denies swallowing difficulty, stomach pain, nausea or vomiting, diarrhea, constipation, bowel incontinence Gu:  Denies bladder incontinence, burning urine Ext:   Denies Joint pain, stiffness or swelling Skin: Denies  skin rash, easy bruising or bleeding or hives Endoc:  Denies polyuria, polydipsia , polyphagia or weight change Psych:   Denies depression, insomnia or hallucinations  Other:  All other systems negative  VITAL SIGNS: Ht 5' 3 (1.6 m)   Wt 179 lb 6.4 oz (81.4 kg)   LMP 12/12/1981   BMI 31.78 kg/m    Physical Examination:   General Appearance: No distress  EYES PERRLA, EOM intact.   NECK Supple, No JVD Throat Mallampati II-III Pulmonary: normal breath sounds, No wheezing.  CardiovascularNormal S1,S2.  No m/r/g.   Abdomen: Benign, Soft, non-tender. Skin:   warm, no rashes, no ecchymosis  Extremities: normal, no cyanosis, clubbing. Neuro:without focal findings,  speech normal  PSYCHIATRIC: Mood, affect within normal limits.   ASSESSMENT AND PLAN  OSA I suspect that OSA is likely present due to clinical presentation. Discussed the consequences of untreated sleep apnea. Advised not to drive drowsy for safety of patient and others. Will complete further evaluation with a home sleep study and follow up to review results.    HTN BP borderline elevated, advised patient to follow up with PCP for further management.   MEDICATION ADJUSTMENTS/LABS AND TESTS ORDERED: Recommend Sleep Study   Patient  satisfied with Plan of action and management.  All questions answered  Follow up to review HST results and treatment plan.   I spent a total of  32 minutes reviewing chart data, face-to-face evaluation with the patient, counseling and coordination of care as detailed above.    Jill Groseclose, M.D.  Sleep Medicine [facility]

## 2024-01-23 DIAGNOSIS — G4730 Sleep apnea, unspecified: Secondary | ICD-10-CM | POA: Diagnosis not present

## 2024-02-06 DIAGNOSIS — G4733 Obstructive sleep apnea (adult) (pediatric): Secondary | ICD-10-CM

## 2024-02-06 DIAGNOSIS — R0683 Snoring: Secondary | ICD-10-CM | POA: Diagnosis not present

## 2024-02-16 DIAGNOSIS — J019 Acute sinusitis, unspecified: Secondary | ICD-10-CM | POA: Diagnosis not present

## 2024-02-16 DIAGNOSIS — J302 Other seasonal allergic rhinitis: Secondary | ICD-10-CM | POA: Diagnosis not present

## 2024-02-16 DIAGNOSIS — H66003 Acute suppurative otitis media without spontaneous rupture of ear drum, bilateral: Secondary | ICD-10-CM | POA: Diagnosis not present

## 2024-02-16 DIAGNOSIS — B9689 Other specified bacterial agents as the cause of diseases classified elsewhere: Secondary | ICD-10-CM | POA: Diagnosis not present

## 2024-02-22 ENCOUNTER — Other Ambulatory Visit: Payer: Self-pay | Admitting: Internal Medicine

## 2024-03-08 ENCOUNTER — Ambulatory Visit (INDEPENDENT_AMBULATORY_CARE_PROVIDER_SITE_OTHER): Admitting: Nurse Practitioner

## 2024-03-08 ENCOUNTER — Ambulatory Visit: Admitting: Family Medicine

## 2024-03-08 ENCOUNTER — Encounter: Payer: Self-pay | Admitting: Nurse Practitioner

## 2024-03-08 VITALS — BP 130/76 | HR 92 | Temp 98.7°F | Ht 63.0 in | Wt 179.8 lb

## 2024-03-08 DIAGNOSIS — H938X1 Other specified disorders of right ear: Secondary | ICD-10-CM

## 2024-03-08 DIAGNOSIS — H66003 Acute suppurative otitis media without spontaneous rupture of ear drum, bilateral: Secondary | ICD-10-CM

## 2024-03-08 MED ORDER — CIPROFLOXACIN-DEXAMETHASONE 0.3-0.1 % OT SUSP: OTIC | 0 refills | Status: DC

## 2024-03-08 MED ORDER — PREDNISONE 20 MG PO TABS: 40.0000 mg | ORAL_TABLET | Freq: Every day | ORAL | 0 refills | Status: AC

## 2024-03-08 NOTE — Progress Notes (Signed)
 Established Patient Office Visit  Subjective:  Patient ID: Jill Torres, female    DOB: 11-13-1951  Age: 73 y.o. MRN: [id number]  CC:  Chief Complaint  Patient presents with   Acute Visit    Both ears are draining Right ear is closed & painful Pain 7/10    HPI  Jill Torres presents for bilateral ear pain and feeling of stopped up. She was seen in [facility] urgent care and has completed a course of Augmentin without improvement. There is pressure in her right ear, which feels plugged, and the drainage is yellowish.  She reports significant sinus issues, describing 'bad sinuses' and sinus pressure. She experiences a cough with thick drainage in her throat, attributed to postnasal drip. She has been using over-the-counter medications including Flonase twice daily, Astelin at night, Zyrtec, saline, and a humidifier. She is on her second box of plain Mucinex tablets.  She is awaiting a referral to an ENT specialist in [facility], facilitated by a friend who is coordinating the appointment.   HPI   Past Medical History:  Diagnosis Date   Allergy    Arthritis    Depression    Diverticulosis of colon (without mention of hemorrhage)    GERD (gastroesophageal reflux disease)    History of chicken pox    History of shingles may 2013   HLD (hyperlipidemia)    HTN (hypertension)    Irritable bowel syndrome    Unspecified hypothyroidism     Past Surgical History:  Procedure Laterality Date   ABDOMINAL HYSTERECTOMY  1983   due to heavy bleeding & ovary cyst   APPENDECTOMY  1974   BLADDER DIVERTICULECTOMY  1990   CESAREAN SECTION     x 2   CHOLECYSTECTOMY     DILATION AND CURETTAGE OF UTERUS     x 2   DIRECT LARYNGOSCOPY Right 06/11/2015   Procedure: suspension microdirect laryngoscopy with biopsy of right tongue base;  Surgeon: Rogers Clayman, MD;  Location: [facility] ORS;  Service: ENT;  Laterality: Right;   NASAL SEPTUM SURGERY  1999   PARTIAL HYSTERECTOMY  1983   cyst on  ovary and heavy bleeding     Family History  Problem Relation Age of Onset   Arthritis Mother    Hypertension Mother    Hyperlipidemia Mother    Atrial fibrillation Mother    Heart disease Mother    Diabetes Mother    Colon polyps Mother    Hyperlipidemia Father    Heart disease Father        s/p CABG   Stroke Father    Rheumatic fever Sister        s/p valve replacement   Deep vein thrombosis Brother        after immobilization    Colon polyps Brother    Heart disease Brother    Diabetes Maternal Grandfather    Heart disease Brother    Diabetes Maternal Aunt    Diabetes Maternal Uncle    Breast cancer Neg Hx    Colon cancer Neg Hx    Esophageal cancer Neg Hx    Rectal cancer Neg Hx    Stomach cancer Neg Hx     Social History   Socioeconomic History   Marital status: Single    Spouse name: Not on file   Number of children: 2   Years of education: Not on file   Highest education level: Not on file  Occupational History   Occupation: retired  Comment: [employer]   Tobacco Use   Smoking status: Never   Smokeless tobacco: Never  Substance and Sexual Activity   Alcohol use: No    Alcohol/week: 0.0 standard drinks of alcohol   Drug use: No   Sexual activity: Not on file  Other Topics Concern   Not on file  Social History Narrative   No regular exercise   Lives in noisy environment- very stressful    Daily caffeine use    Social Drivers of Corporate investment banker Strain: Low Risk  (09/04/2023)   Overall Financial Resource Strain (CARDIA)    Difficulty of Paying Living Expenses: Not hard at all  Food Insecurity: No Food Insecurity (09/04/2023)   Hunger Vital Sign    Worried About Running Out of Food in the Last Year: Never true    Ran Out of Food in the Last Year: Never true  Transportation Needs: No Transportation Needs (09/04/2023)   PRAPARE - Administrator, Civil Service (Medical): No    Lack of Transportation  (Non-Medical): No  Physical Activity: Inactive (09/04/2023)   Exercise Vital Sign    Days of Exercise per Week: 0 days    Minutes of Exercise per Session: 0 min  Stress: No Stress Concern Present (09/04/2023)   [employer] of Occupational Health - Occupational Stress Questionnaire    Feeling of Stress : Not at all  Social Connections: Moderately Isolated (09/04/2023)   Social Connection and Isolation Panel [NHANES]    Frequency of Communication with Friends and Family: More than three times a week    Frequency of Social Gatherings with Friends and Family: More than three times a week    Attends Religious Services: More than 4 times per year    Active Member of [employer] or Organizations: No    Attends Banker Meetings: Never    Marital Status: Divorced  Catering manager Violence: Not At Risk (09/04/2023)   Humiliation, Afraid, Rape, and Kick questionnaire    Fear of Current or Ex-Partner: No    Emotionally Abused: No    Physically Abused: No    Sexually Abused: No     Outpatient Medications Prior to Visit  Medication Sig Dispense Refill   amLODipine (NORVASC) 2.5 MG tablet Take 1 tablet (2.5 mg total) by mouth daily. 90 tablet 3   Ascorbic Acid (VITAMIN C) 1000 MG tablet Take 1,000 mg by mouth daily.     bifidobacterium infantis (ALIGN) capsule Take 1 capsule by mouth daily.     calcium-vitamin D (OSCAL WITH D 500-200) 500-200 MG-UNIT per tablet Take 1 tablet by mouth daily.     Coenzyme Q10 (COQ10 PO) Take by mouth daily.     fish oil-omega-3 fatty acids 1000 MG capsule Take 2 g by mouth daily.     fluticasone (FLONASE) 50 MCG/ACT nasal spray SPRAY 2 SPRAYS INTO EACH NOSTRIL EVERY DAY 48 mL 2   multivitamin (THERAGRAN) per tablet Take 1 tablet by mouth daily.     polyethylene glycol powder (GLYCOLAX/MIRALAX) 17 GM/SCOOP powder Take 17 g by mouth as needed.     psyllium (REGULOID) 0.52 g capsule Take 0.52 g by mouth daily as needed.     rosuvastatin (CRESTOR) 20 MG  tablet Take 1 tablet (20 mg total) by mouth daily. 90 tablet 3   Azelastine HCl 137 MCG/SPRAY SOLN PLACE 1 SPRAY INTO BOTH NOSTRILS 2 (TWO) TIMES DAILY. USE IN EACH NOSTRIL AS DIRECTED 90 mL 1   No  facility-administered medications prior to visit.    No Known Allergies  ROS Review of Systems Negative unless indicated in HPI.    Objective:    Physical Exam HENT:     Right Ear: A middle ear effusion is present. Tympanic membrane is erythematous.     Left Ear: Tympanic membrane is erythematous.     Mouth/Throat:     Mouth: Mucous membranes are moist.     Pharynx: No pharyngeal swelling, oropharyngeal exudate or posterior oropharyngeal erythema.     Tonsils: No tonsillar exudate.  Cardiovascular:     Rate and Rhythm: Normal rate and regular rhythm.  Pulmonary:     Effort: Pulmonary effort is normal.     Breath sounds: Normal breath sounds. No stridor. No wheezing.  Neurological:     General: No focal deficit present.     Mental Status: She is oriented to person, place, and time. Mental status is at baseline.  Psychiatric:        Mood and Affect: Mood normal.        Behavior: Behavior normal.        Thought Content: Thought content normal.        Judgment: Judgment normal.     BP 130/76   Pulse 92   Temp 98.7 F (37.1 C)   Ht 5\' 3"  (1.6 m)   Wt 179 lb 12.8 oz (81.6 kg)   LMP 12/12/1981   SpO2 96%   BMI 31.85 kg/m  Wt Readings from Last 3 Encounters:  03/08/24 179 lb 12.8 oz (81.6 kg)  01/16/24 179 lb 6.4 oz (81.4 kg)  12/29/23 176 lb 3.2 oz (79.9 kg)     Health Maintenance  Topic Date Due   Zoster Vaccines- Shingrix (1 of 2) 03/28/2024 (Originally 07/20/2002)   INFLUENZA VACCINE  07/12/2024   Medicare Annual Wellness (AWV)  09/03/2024   MAMMOGRAM  11/20/2024   Colonoscopy  02/21/2029   Pneumonia Vaccine 69+ Years old  Completed   DEXA SCAN  Completed   Hepatitis C Screening  Completed   HPV VACCINES  Aged Out   Meningococcal B Vaccine  Aged Out    DTaP/Tdap/Td  Discontinued   COVID-19 Vaccine  Discontinued    There are no preventive care reminders to display for this patient.  Lab Results  Component Value Date   TSH 0.82 12/29/2023   Lab Results  Component Value Date   WBC 10.4 02/15/2023   HGB 13.3 02/15/2023   HCT 39.8 02/15/2023   MCV 91.4 02/15/2023   PLT 346.0 02/15/2023   Lab Results  Component Value Date   NA 140 12/29/2023   K 3.7 12/29/2023   CO2 26 12/29/2023   GLUCOSE 104 (H) 12/29/2023   BUN 11 12/29/2023   CREATININE 0.97 12/29/2023   BILITOT 0.8 12/29/2023   ALKPHOS 56 12/29/2023   AST 15 12/29/2023   ALT 13 12/29/2023   PROT 7.4 12/29/2023   ALBUMIN 4.3 12/29/2023   CALCIUM 9.4 12/29/2023   ANIONGAP 10 07/11/2013   GFR 58.82 (L) 12/29/2023   Lab Results  Component Value Date   CHOL 151 12/29/2023   Lab Results  Component Value Date   HDL 62.00 12/29/2023   Lab Results  Component Value Date   LDLCALC 62 12/29/2023   Lab Results  Component Value Date   TRIG 135.0 12/29/2023   Lab Results  Component Value Date   CHOLHDL 2 12/29/2023   No results found for: "HGBA1C"    Assessment & Plan:  Non-recurrent acute suppurative otitis media of both ears without spontaneous rupture of tympanic membranes Assessment & Plan: Persistent bilateral ear drainage, hearing loss, and pain, particularly in the right ear, with questionable  ruptured tympanic membrane. Previous treatment with Augmentin and prednisone ineffective.  - Prescribed Ciprodex ear drops for both ears. - Prescribed prednisone to reduce inflammation. - Referred to ENT specialist Dr. Ruel Cotta in [facility] for further evaluation and management. Awaiting her confirmation to send referral.   Clogged ear, right  Other orders -     predniSONE; Take 2 tablets (40 mg total) by mouth daily with breakfast for 5 days.  Dispense: 10 tablet; Refill: 0 -     Ciprofloxacin-dexAMETHasone; 4 to 6 drops into both ears TID x 5 days  Dispense: 4  mL; Refill: 0    Follow-up: No follow-ups on file.   Mavric Cortright, NP

## 2024-03-15 ENCOUNTER — Other Ambulatory Visit: Payer: Self-pay | Admitting: Internal Medicine

## 2024-03-18 DIAGNOSIS — H43812 Vitreous degeneration, left eye: Secondary | ICD-10-CM | POA: Diagnosis not present

## 2024-03-18 DIAGNOSIS — H2513 Age-related nuclear cataract, bilateral: Secondary | ICD-10-CM | POA: Diagnosis not present

## 2024-03-18 DIAGNOSIS — H40013 Open angle with borderline findings, low risk, bilateral: Secondary | ICD-10-CM | POA: Diagnosis not present

## 2024-03-18 DIAGNOSIS — H5203 Hypermetropia, bilateral: Secondary | ICD-10-CM | POA: Diagnosis not present

## 2024-03-18 DIAGNOSIS — H52223 Regular astigmatism, bilateral: Secondary | ICD-10-CM | POA: Diagnosis not present

## 2024-03-18 DIAGNOSIS — H524 Presbyopia: Secondary | ICD-10-CM | POA: Diagnosis not present

## 2024-03-24 DIAGNOSIS — H66003 Acute suppurative otitis media without spontaneous rupture of ear drum, bilateral: Secondary | ICD-10-CM | POA: Insufficient documentation

## 2024-03-24 NOTE — Assessment & Plan Note (Signed)
 Persistent bilateral ear drainage, hearing loss, and pain, particularly in the right ear, with questionable  ruptured tympanic membrane. Previous treatment with Augmentin and prednisone ineffective.  - Prescribed Ciprodex ear drops for both ears. - Prescribed prednisone to reduce inflammation. - Referred to ENT specialist Dr. Ruel Cotta in [facility] for further evaluation and management. Awaiting her confirmation to send referral.

## 2024-04-25 ENCOUNTER — Other Ambulatory Visit: Payer: Self-pay

## 2024-04-25 DIAGNOSIS — E7800 Pure hypercholesterolemia, unspecified: Secondary | ICD-10-CM

## 2024-04-25 DIAGNOSIS — R1032 Left lower quadrant pain: Secondary | ICD-10-CM

## 2024-04-25 DIAGNOSIS — I10 Essential (primary) hypertension: Secondary | ICD-10-CM

## 2024-04-26 ENCOUNTER — Other Ambulatory Visit (INDEPENDENT_AMBULATORY_CARE_PROVIDER_SITE_OTHER): Payer: Medicare HMO

## 2024-04-26 DIAGNOSIS — E7800 Pure hypercholesterolemia, unspecified: Secondary | ICD-10-CM

## 2024-04-26 DIAGNOSIS — I10 Essential (primary) hypertension: Secondary | ICD-10-CM | POA: Diagnosis not present

## 2024-04-26 DIAGNOSIS — R1032 Left lower quadrant pain: Secondary | ICD-10-CM

## 2024-04-26 LAB — HEPATIC FUNCTION PANEL
ALT: 11 U/L (ref 0–35)
AST: 13 U/L (ref 0–37)
Albumin: 4.2 g/dL (ref 3.5–5.2)
Alkaline Phosphatase: 56 U/L (ref 39–117)
Bilirubin, Direct: 0.1 mg/dL (ref 0.0–0.3)
Total Bilirubin: 0.7 mg/dL (ref 0.2–1.2)
Total Protein: 7 g/dL (ref 6.0–8.3)

## 2024-04-26 LAB — BASIC METABOLIC PANEL WITH GFR
BUN: 14 mg/dL (ref 6–23)
CO2: 27 meq/L (ref 19–32)
Calcium: 9.1 mg/dL (ref 8.4–10.5)
Chloride: 105 meq/L (ref 96–112)
Creatinine, Ser: 1.01 mg/dL (ref 0.40–1.20)
GFR: 55.91 mL/min — ABNORMAL LOW (ref >60.00)
Glucose, Bld: 98 mg/dL (ref 70–99)
Potassium: 4 meq/L (ref 3.5–5.1)
Sodium: 140 meq/L (ref 135–145)

## 2024-04-26 LAB — CBC WITH DIFFERENTIAL/PLATELET
Basophils Absolute: 0.1 10*3/uL (ref 0.0–0.1)
Basophils Relative: 1.4 % (ref 0.0–3.0)
Eosinophils Absolute: 0.1 10*3/uL (ref 0.0–0.7)
Eosinophils Relative: 1.7 % (ref 0.0–5.0)
HCT: 40.6 % (ref 36.0–46.0)
Hemoglobin: 13.5 g/dL (ref 12.0–15.0)
Lymphocytes Relative: 31 % (ref 12.0–46.0)
Lymphs Abs: 2.2 10*3/uL (ref 0.7–4.0)
MCHC: 33.1 g/dL (ref 30.0–36.0)
MCV: 90.5 fl (ref 78.0–100.0)
Monocytes Absolute: 0.6 10*3/uL (ref 0.1–1.0)
Monocytes Relative: 7.9 % (ref 3.0–12.0)
Neutro Abs: 4.2 10*3/uL (ref 1.4–7.7)
Neutrophils Relative %: 58 % (ref 43.0–77.0)
Platelets: 319 10*3/uL (ref 150.0–400.0)
RBC: 4.49 Mil/uL (ref 3.87–5.11)
RDW: 12.9 % (ref 11.5–15.5)
WBC: 7.2 10*3/uL (ref 4.0–10.5)

## 2024-04-26 LAB — LIPID PANEL
Cholesterol: 144 mg/dL (ref 0–200)
HDL: 54.8 mg/dL (ref >39.00)
LDL Cholesterol: 64 mg/dL (ref 0–99)
NonHDL: 89.03
Total CHOL/HDL Ratio: 3
Triglycerides: 125 mg/dL (ref 0.0–149.0)
VLDL: 25 mg/dL (ref 0.0–40.0)

## 2024-04-29 ENCOUNTER — Ambulatory Visit: Payer: Self-pay | Admitting: Internal Medicine

## 2024-04-30 ENCOUNTER — Telehealth: Payer: Self-pay | Admitting: *Deleted

## 2024-04-30 ENCOUNTER — Ambulatory Visit (INDEPENDENT_AMBULATORY_CARE_PROVIDER_SITE_OTHER): Payer: Medicare HMO | Admitting: Internal Medicine

## 2024-04-30 VITALS — BP 130/70 | HR 78 | Temp 98.0°F | Resp 16 | Ht 63.0 in | Wt 177.0 lb

## 2024-04-30 DIAGNOSIS — I10 Essential (primary) hypertension: Secondary | ICD-10-CM

## 2024-04-30 DIAGNOSIS — G4733 Obstructive sleep apnea (adult) (pediatric): Secondary | ICD-10-CM | POA: Diagnosis not present

## 2024-04-30 DIAGNOSIS — Z0000 Encounter for general adult medical examination without abnormal findings: Secondary | ICD-10-CM

## 2024-04-30 DIAGNOSIS — E7800 Pure hypercholesterolemia, unspecified: Secondary | ICD-10-CM

## 2024-04-30 DIAGNOSIS — H938X9 Other specified disorders of ear, unspecified ear: Secondary | ICD-10-CM

## 2024-04-30 NOTE — Assessment & Plan Note (Signed)
 Physical today 04/30/24..  Mammogram 11/21/23 Birads I.  Colonoscopy 02/2019 - 2mm polyp (cecum).

## 2024-04-30 NOTE — Telephone Encounter (Signed)
 Copied from CRM [id number]. Topic: General - Other >> Apr 30, 2024  3:06 PM Jill Torres wrote: Reason for CRM: Patient would like a call prior to ordering her CPAP machine. Questions regarding the cost. Please call.

## 2024-04-30 NOTE — Progress Notes (Unsigned)
 Subjective:    Patient ID: Jill Torres, female    DOB: July 30, 1951, 73 y.o.   MRN: [id number]  Patient here for  Chief Complaint  Patient presents with  . Annual Exam    HPI Here for a physical exam. Saw pulmonary 01/16/24 - recommended HST. HST - sleep apnea - recommended cpap.  Continues on crestor  and amlodipine .   Past Medical History:  Diagnosis Date  . Allergy   . Arthritis   . Depression   . Diverticulosis of colon (without mention of hemorrhage)   . GERD (gastroesophageal reflux disease)   . History of chicken pox   . History of shingles may 2013  . HLD (hyperlipidemia)   . HTN (hypertension)   . Irritable bowel syndrome   . Unspecified hypothyroidism    Past Surgical History:  Procedure Laterality Date  . ABDOMINAL HYSTERECTOMY  1983   due to heavy bleeding & ovary cyst  . APPENDECTOMY  1974  . BLADDER DIVERTICULECTOMY  1990  . CESAREAN SECTION     x 2  . CHOLECYSTECTOMY    . DILATION AND CURETTAGE OF UTERUS     x 2  . DIRECT LARYNGOSCOPY Right 06/11/2015   Procedure: suspension microdirect laryngoscopy with biopsy of right tongue base;  Surgeon: Rogers Clayman, MD;  Location: [facility] ORS;  Service: ENT;  Laterality: Right;  . NASAL SEPTUM SURGERY  1999  . PARTIAL HYSTERECTOMY  1983   cyst on ovary and heavy bleeding    Family History  Problem Relation Age of Onset  . Arthritis Mother   . Hypertension Mother   . Hyperlipidemia Mother   . Atrial fibrillation Mother   . Heart disease Mother   . Diabetes Mother   . Colon polyps Mother   . Hyperlipidemia Father   . Heart disease Father        s/p CABG  . Stroke Father   . Rheumatic fever Sister        s/p valve replacement  . Deep vein thrombosis Brother        after immobilization   . Colon polyps Brother   . Heart disease Brother   . Diabetes Maternal Grandfather   . Heart disease Brother   . Diabetes Maternal Aunt   . Diabetes Maternal Uncle   . Breast cancer Neg Hx   . Colon cancer Neg  Hx   . Esophageal cancer Neg Hx   . Rectal cancer Neg Hx   . Stomach cancer Neg Hx    Social History   Socioeconomic History  . Marital status: Single    Spouse name: Not on file  . Number of children: 2  . Years of education: Not on file  . Highest education level: Not on file  Occupational History  . Occupation: retired    Comment: Paramedic   Tobacco Use  . Smoking status: Never  . Smokeless tobacco: Never  Substance and Sexual Activity  . Alcohol use: No    Alcohol/week: 0.0 standard drinks of alcohol  . Drug use: No  . Sexual activity: Not on file  Other Topics Concern  . Not on file  Social History Narrative   No regular exercise   Lives in noisy environment- very stressful    Daily caffeine use    Social Drivers of Health   Financial Resource Strain: Low Risk  (09/04/2023)   Overall Financial Resource Strain (CARDIA)   . Difficulty of Paying Living Expenses: Not hard at all  Food Insecurity: No Food Insecurity (09/04/2023)   Hunger Vital Sign   . Worried About Programme researcher, broadcasting/film/video in the Last Year: Never true   . Ran Out of Food in the Last Year: Never true  Transportation Needs: No Transportation Needs (09/04/2023)   PRAPARE - Transportation   . Lack of Transportation (Medical): No   . Lack of Transportation (Non-Medical): No  Physical Activity: Inactive (09/04/2023)   Exercise Vital Sign   . Days of Exercise per Week: 0 days   . Minutes of Exercise per Session: 0 min  Stress: No Stress Concern Present (09/04/2023)   [employer] of Occupational Health - Occupational Stress Questionnaire   . Feeling of Stress : Not at all  Social Connections: Moderately Isolated (09/04/2023)   Social Connection and Isolation Panel [NHANES]   . Frequency of Communication with Friends and Family: More than three times a week   . Frequency of Social Gatherings with Friends and Family: More than three times a week   . Attends Religious Services: More than 4 times  per year   . Active Member of Clubs or Organizations: No   . Attends Banker Meetings: Never   . Marital Status: Divorced     Review of Systems     Objective:     BP 130/70   Pulse 78   Temp 98 F (36.7 C)   Resp 16   Ht 5\' 3"  (1.6 m)   Wt 177 lb (80.3 kg)   LMP 12/12/1981   SpO2 98%   BMI 31.35 kg/m  Wt Readings from Last 3 Encounters:  04/30/24 177 lb (80.3 kg)  03/08/24 179 lb 12.8 oz (81.6 kg)  01/16/24 179 lb 6.4 oz (81.4 kg)    Physical Exam  {Perform Simple Foot Exam  Perform Detailed exam:1} {Insert foot Exam (Optional):30965}   Outpatient Encounter Medications as of 04/30/2024  Medication Sig  . amLODipine  (NORVASC ) 2.5 MG tablet Take 1 tablet (2.5 mg total) by mouth daily.  . Ascorbic Acid (VITAMIN C) 1000 MG tablet Take 1,000 mg by mouth daily.  . Azelastine  HCl 137 MCG/SPRAY SOLN PLACE 1 SPRAY INTO BOTH NOSTRILS 2 (TWO) TIMES DAILY. USE IN EACH NOSTRIL AS DIRECTED  . bifidobacterium infantis (ALIGN) capsule Take 1 capsule by mouth daily.  . calcium -vitamin D (OSCAL WITH D 500-200) 500-200 MG-UNIT per tablet Take 1 tablet by mouth daily.  . Coenzyme Q10 (COQ10 PO) Take by mouth daily.  . fish oil-omega-3 fatty acids 1000 MG capsule Take 2 g by mouth daily.  . fluticasone  (FLONASE ) 50 MCG/ACT nasal spray SPRAY 2 SPRAYS INTO EACH NOSTRIL EVERY DAY  . multivitamin (THERAGRAN) per tablet Take 1 tablet by mouth daily.  . polyethylene glycol powder (GLYCOLAX/MIRALAX) 17 GM/SCOOP powder Take 17 g by mouth as needed.  . psyllium (REGULOID) 0.52 g capsule Take 0.52 g by mouth daily as needed.  . rosuvastatin  (CRESTOR ) 20 MG tablet Take 1 tablet (20 mg total) by mouth daily.  . [DISCONTINUED] ciprofloxacin -dexamethasone  (CIPRODEX ) OTIC suspension 4 to 6 drops into both ears TID x 5 days   No facility-administered encounter medications on file as of 04/30/2024.     Lab Results  Component Value Date   WBC 7.2 04/26/2024   HGB 13.5 04/26/2024   HCT  40.6 04/26/2024   PLT 319.0 04/26/2024   GLUCOSE 98 04/26/2024   CHOL 144 04/26/2024   TRIG 125.0 04/26/2024   HDL 54.80 04/26/2024   LDLDIRECT 81.0 07/22/2022   LDLCALC  64 04/26/2024   ALT 11 04/26/2024   AST 13 04/26/2024   NA 140 04/26/2024   K 4.0 04/26/2024   CL 105 04/26/2024   CREATININE 1.01 04/26/2024   BUN 14 04/26/2024   CO2 27 04/26/2024   TSH 0.82 12/29/2023    MM 3D SCREENING MAMMOGRAM BILATERAL BREAST Result Date: 11/23/2023 CLINICAL DATA:  Screening. EXAM: DIGITAL SCREENING BILATERAL MAMMOGRAM WITH TOMOSYNTHESIS AND CAD TECHNIQUE: Bilateral screening digital craniocaudal and mediolateral oblique mammograms were obtained. Bilateral screening digital breast tomosynthesis was performed. The images were evaluated with computer-aided detection. COMPARISON:  Previous exam(s). ACR Breast Density Category c: The breasts are heterogeneously dense, which may obscure small masses. FINDINGS: There are no findings suspicious for malignancy. IMPRESSION: No mammographic evidence of malignancy. A result letter of this screening mammogram will be mailed directly to the patient. RECOMMENDATION: Screening mammogram in one year. (Code:SM-B-01Y) BI-RADS CATEGORY  1: Negative. Electronically Signed   By: Alger Infield M.D.   On: 11/23/2023 08:50   DG Bone Density Result Date: 11/21/2023 EXAM: DUAL X-RAY ABSORPTIOMETRY (DXA) FOR BONE MINERAL DENSITY IMPRESSION: Your patient Gwenyth Dingee completed a BMD test on 11/21/2023 using the [employer] DXA System (software version: 14.10) manufactured by [employer]. The following summarizes the results of our evaluation. Technologist: SCE PATIENT BIOGRAPHICAL: Name: Judit, Awad Patient ID: [id number] Birth Date: [birth year] Height: 63.0 in. Gender: Female Exam Date: 11/21/2023 Weight: 177.9 lbs. Indications: Advanced Age, Caucasian, Family Hist. (Parent hip fracture), Height Loss, Hysterectomy, Oophorectomy Bilateral, Parent Hip  Fracture, Postmenopausal Fractures: Treatments: Multi-Vitamin DENSITOMETRY RESULTS: Site      Region     Measured Date Measured Age WHO Classification Young Adult T-score BMD         %Change vs. Previous Significant Change (*) AP Spine L1-L4 11/21/2023 71.3 Normal -0.1 1.179 g/cm2 0.3% - AP Spine L1-L4 10/29/2019 67.2 Normal -0.2 1.175 g/cm2 - - DualFemur Neck Left 11/21/2023 71.3 Osteopenia -2.1 0.746 g/cm2 -3.9% - DualFemur Neck Left 10/29/2019 67.2 Osteopenia -1.9 0.776 g/cm2 - - DualFemur Total Mean 11/21/2023 71.3 Osteopenia -1.7 0.795 g/cm2 -2.5% Yes DualFemur Total Mean 10/29/2019 67.2 Osteopenia -1.5 0.815 g/cm2 - - ASSESSMENT: The BMD measured at Femur Neck Left is 0.746 g/cm2 with a T-score of -2.1. This patient's diagnostic category is LOW BONE MASS/OSTEOPENIA according to World Health Organization [facility]) criteria. The scan quality is good. Comparison to 10/29/2019. Since the prior study, there has been NO SIGNIFICANT CHANGE in bone mineral density of the lumbar spine or hips. World Science writer [facility]) criteria for post-menopausal, Caucasian Women: Normal:                   T-score at or above -1 SD Osteopenia/low bone mass: T-score between -1 and -2.5 SD Osteoporosis:             T-score at or below -2.5 SD RECOMMENDATIONS: 1. All patients should optimize calcium  and vitamin D intake. 2. Consider FDA-approved medical therapies in postmenopausal women and men aged 21 years and older, based on the following: a. A hip or vertebral(clinical or morphometric) fracture b. T-score < -2.5 at the femoral neck or spine after appropriate evaluation to exclude secondary causes c. Low bone mass (T-score between -1.0 and -2.5 at the femoral neck or spine) and a 10-year probability of a hip fracture > 3% or a 10-year probability of a major osteoporosis-related fracture > 20% based on the [place] -adapted WHO algorithm 3. Clinician judgment and/or patient preferences may indicate treatment for people  with 10-year fracture  probabilities above or below these levels FOLLOW-UP: People with diagnosed cases of osteoporosis or at high risk for fracture should have regular bone mineral density tests. For patients eligible for Medicare, routine testing is allowed once every 2 years. The testing frequency can be increased to one year for patients who have rapidly progressing disease, those who are receiving or discontinuing medical therapy to restore bone mass, or have additional risk factors. I have reviewed this report, and agree with the above findings. [facility] Dear Dellar Fenton, Your patient Baneza Bartoszek Dunphy completed a FRAX assessment on 11/21/2023 using the [facility] iDXA DXA System (analysis version: 14.10) manufactured by [employer]. The following summarizes the results of our evaluation. PATIENT BIOGRAPHICAL: Name: Janiaya, Ryser Patient ID: [id number] Birth Date: Sep 28, 1951 Height:    63.0 in. Gender:     Female    [age]       Weight:    177.9 lbs. Ethnicity:  White                            Exam Date: 11/21/2023 FRAX* RESULTS:  (version: 3.5) 10-year Probability of Fracture1 Major Osteoporotic Fracture2 Hip Fracture 19.7% 6.7% Population: [place]  (Caucasian) Risk Factors: Family Hist. (Parent hip fracture) Based on Femur (Left) Neck BMD 1 -The 10-year probability of fracture may be lower than reported if the patient has received treatment. 2 -Major Osteoporotic Fracture: Clinical Spine, Forearm, Hip or Shoulder *FRAX is a Armed forces logistics/support/administrative officer of the [employer] of [employer] for Metabolic Bone Disease, a World Science writer (WHO) [employer]. ASSESSMENT: The probability of a major osteoporotic fracture is 19.7% within the next ten years. The probability of a hip fracture is 6.7% within the next ten years. . Electronically Signed   By: Sundra Engel M.D.   On: 11/21/2023 10:17       Assessment & Plan:  Health care maintenance Assessment & Plan: Physical today 04/30/24..   Mammogram 11/21/23 Birads I.  Colonoscopy 02/2019 - 2mm polyp (cecum).    Hypercholesterolemia  Hypertension, unspecified type     Dellar Fenton, MD

## 2024-05-02 ENCOUNTER — Encounter: Payer: Self-pay | Admitting: Sleep Medicine

## 2024-05-02 ENCOUNTER — Encounter: Payer: Self-pay | Admitting: Internal Medicine

## 2024-05-02 ENCOUNTER — Ambulatory Visit (INDEPENDENT_AMBULATORY_CARE_PROVIDER_SITE_OTHER): Admitting: Sleep Medicine

## 2024-05-02 VITALS — BP 144/72 | HR 71 | Temp 96.9°F | Ht 63.0 in | Wt 177.2 lb

## 2024-05-02 DIAGNOSIS — E66811 Obesity, class 1: Secondary | ICD-10-CM

## 2024-05-02 DIAGNOSIS — I10 Essential (primary) hypertension: Secondary | ICD-10-CM

## 2024-05-02 DIAGNOSIS — G4733 Obstructive sleep apnea (adult) (pediatric): Secondary | ICD-10-CM

## 2024-05-02 DIAGNOSIS — Z6831 Body mass index (BMI) 31.0-31.9, adult: Secondary | ICD-10-CM | POA: Diagnosis not present

## 2024-05-02 DIAGNOSIS — E669 Obesity, unspecified: Secondary | ICD-10-CM | POA: Diagnosis not present

## 2024-05-02 NOTE — Patient Instructions (Signed)

## 2024-05-02 NOTE — Progress Notes (Signed)
 Name:Jill Torres MRN: [id number] DOB: 1951-06-09   CHIEF COMPLAINT:  HST F/U   HISTORY OF PRESENT ILLNESS:  Jill Torres is a 73 y.o. w/ a h/o HTN, obesity, hyperlipidemia who presents to follow up on HST results. The patient underwent HST which revealed severe OSA (AHI 61, O2 nadir 86%).     EPWORTH SLEEP SCORE    01/16/2024   10:16 AM  Results of the Epworth flowsheet  Sitting and reading 2  Watching TV 2  Sitting, inactive in a public place (e.g. a theatre or a meeting) 0  As a passenger in a car for an hour without a break 1  Lying down to rest in the afternoon when circumstances permit 2  Sitting and talking to someone 0  Sitting quietly after a lunch without alcohol 0  In a car, while stopped for a few minutes in traffic 0  Total score 7    PAST MEDICAL HISTORY :   has a past medical history of Allergy, Arthritis, Depression, Diverticulosis of colon (without mention of hemorrhage), GERD (gastroesophageal reflux disease), History of chicken pox, History of shingles (may 2013), HLD (hyperlipidemia), HTN (hypertension), Irritable bowel syndrome, and Unspecified hypothyroidism.  has a past surgical history that includes Cesarean section; Cholecystectomy; Dilation and curettage of uterus; Partial hysterectomy (1983); Bladder diverticulectomy (1990); Nasal septum surgery (1999); Appendectomy (1974); Abdominal hysterectomy (1983); and Direct laryngoscopy (Right, 06/11/2015). Prior to Admission medications   Medication Sig Start Date End Date Taking? Authorizing Provider  amLODipine  (NORVASC ) 2.5 MG tablet Take 1 tablet (2.5 mg total) by mouth daily. 12/29/23   Dellar Fenton, MD  Ascorbic Acid (VITAMIN C) 1000 MG tablet Take 1,000 mg by mouth daily.    [provider]  Azelastine  HCl 137 MCG/SPRAY SOLN PLACE 1 SPRAY INTO BOTH NOSTRILS 2 (TWO) TIMES DAILY. USE IN EACH NOSTRIL AS DIRECTED 03/18/24   Dellar Fenton, MD  bifidobacterium infantis (ALIGN) capsule  Take 1 capsule by mouth daily.    [provider]  calcium -vitamin D (OSCAL WITH D 500-200) 500-200 MG-UNIT per tablet Take 1 tablet by mouth daily.    [provider]  Coenzyme Q10 (COQ10 PO) Take by mouth daily.    [provider]  fish oil-omega-3 fatty acids 1000 MG capsule Take 2 g by mouth daily.    [provider]  fluticasone  (FLONASE ) 50 MCG/ACT nasal spray SPRAY 2 SPRAYS INTO EACH NOSTRIL EVERY DAY 02/22/24   Scott, Charlene, MD  multivitamin (THERAGRAN) per tablet Take 1 tablet by mouth daily.    [provider]  polyethylene glycol powder (GLYCOLAX/MIRALAX) 17 GM/SCOOP powder Take 17 g by mouth as needed.    [provider]  psyllium (REGULOID) 0.52 g capsule Take 0.52 g by mouth daily as needed.    [provider]  rosuvastatin  (CRESTOR ) 20 MG tablet Take 1 tablet (20 mg total) by mouth daily. 12/29/23   Dellar Fenton, MD   No Known Allergies  FAMILY HISTORY:  family history includes Arthritis in her mother; Atrial fibrillation in her mother; Colon polyps in her brother and mother; Deep vein thrombosis in her brother; Diabetes in her maternal aunt, maternal grandfather, maternal uncle, and mother; Heart disease in her brother, brother, father, and mother; Hyperlipidemia in her father and mother; Hypertension in her mother; Rheumatic fever in her sister; Stroke in her father. SOCIAL HISTORY:  reports that she has never smoked. She has never used smokeless tobacco. She reports that she does not  drink alcohol and does not use drugs.   Review of Systems:  Gen:  Denies  fever, sweats, chills weight loss  HEENT: Denies blurred vision, double vision, ear pain, eye pain, hearing loss, nose bleeds, sore throat Cardiac:  No dizziness, chest pain or heaviness, chest tightness,edema, No JVD Resp:   No cough, -sputum production, -shortness of breath,-wheezing, -hemoptysis,  Gi: Denies swallowing difficulty, stomach pain, nausea or  vomiting, diarrhea, constipation, bowel incontinence Gu:  Denies bladder incontinence, burning urine Ext:   Denies Joint pain, stiffness or swelling Skin: Denies  skin rash, easy bruising or bleeding or hives Endoc:  Denies polyuria, polydipsia , polyphagia or weight change Psych:   Denies depression, insomnia or hallucinations  Other:  All other systems negative  VITAL SIGNS: BP (!) 144/72 (BP Location: Right Arm, Cuff Size: Normal)   Pulse 71   Temp (!) 96.9 F (36.1 C)   Ht 5\' 3"  (1.6 m)   Wt 177 lb 3.2 oz (80.4 kg)   LMP 12/12/1981   SpO2 97%   BMI 31.39 kg/m     Physical Examination:   General Appearance: No distress  EYES PERRLA, EOM intact.   NECK Supple, No JVD Pulmonary: normal breath sounds, No wheezing.  CardiovascularNormal S1,S2.  No m/r/g.   Abdomen: Benign, Soft, non-tender. Skin:   warm, no rashes, no ecchymosis  Extremities: normal, no cyanosis, clubbing. Neuro:without focal findings,  speech normal  PSYCHIATRIC: Mood, affect within normal limits.   ASSESSMENT AND PLAN  OSA Reviewed HST results with patient. Starting on APAP therapy set to 4-16 cm H2O. Discussed the consequences of untreated sleep apnea. Advised not to drive drowsy for safety of patient and others. Will follow up in 3 months.    HTN Stable, on current management. Following with PCP.   Obesity Counseled patient on diet and lifestyle modification.    Patient  satisfied with Plan of action and management. All questions answered  I spent a total of 21 minutes reviewing chart data, face-to-face evaluation with the patient, counseling and coordination of care as detailed above.    Lizbet Cirrincione, M.D.  Sleep Medicine [facility]

## 2024-05-06 ENCOUNTER — Encounter: Payer: Self-pay | Admitting: Internal Medicine

## 2024-05-06 NOTE — Assessment & Plan Note (Signed)
 Continue amlodipine.  Follow pressures.  Follow metabolic panel. No changes in medication today.

## 2024-05-06 NOTE — Assessment & Plan Note (Signed)
 On crestor .  Low cholesterol diet and exercise.  Follow lipid panel.   Lab Results  Component Value Date   CHOL 144 04/26/2024   HDL 54.80 04/26/2024   LDLCALC 64 04/26/2024   LDLDIRECT 81.0 07/22/2022   TRIG 125.0 04/26/2024   CHOLHDL 3 04/26/2024

## 2024-05-06 NOTE — Assessment & Plan Note (Signed)
 Recent evaluation for ear pain as outlined. Pain is better. Still with increased sensitivity - ear. Some persistent drainage. Continue steroid nasal spray. Has tried antihistamine. Recently treated with augmentin  and prednisone . Refer to ENT for further evaluation. Request to see Dr Fawn Hooks Senior.

## 2024-05-06 NOTE — Assessment & Plan Note (Signed)
 Seeing pulmonary. HST with sleep apnea. F/u with pulmonary this week.

## 2024-05-27 DIAGNOSIS — G4733 Obstructive sleep apnea (adult) (pediatric): Secondary | ICD-10-CM | POA: Diagnosis not present

## 2024-06-12 ENCOUNTER — Other Ambulatory Visit: Payer: Self-pay | Admitting: Internal Medicine

## 2024-06-26 DIAGNOSIS — G4733 Obstructive sleep apnea (adult) (pediatric): Secondary | ICD-10-CM | POA: Diagnosis not present

## 2024-07-08 ENCOUNTER — Encounter: Payer: Self-pay | Admitting: Internal Medicine

## 2024-07-08 DIAGNOSIS — Z01118 Encounter for examination of ears and hearing with other abnormal findings: Secondary | ICD-10-CM | POA: Diagnosis not present

## 2024-07-08 DIAGNOSIS — J310 Chronic rhinitis: Secondary | ICD-10-CM | POA: Diagnosis not present

## 2024-07-08 DIAGNOSIS — J329 Chronic sinusitis, unspecified: Secondary | ICD-10-CM | POA: Diagnosis not present

## 2024-07-08 DIAGNOSIS — H903 Sensorineural hearing loss, bilateral: Secondary | ICD-10-CM | POA: Diagnosis not present

## 2024-07-08 DIAGNOSIS — Z9889 Other specified postprocedural states: Secondary | ICD-10-CM | POA: Diagnosis not present

## 2024-07-08 DIAGNOSIS — H938X3 Other specified disorders of ear, bilateral: Secondary | ICD-10-CM | POA: Diagnosis not present

## 2024-07-10 ENCOUNTER — Encounter: Payer: Self-pay | Admitting: Internal Medicine

## 2024-07-10 ENCOUNTER — Emergency Department
Admission: EM | Admit: 2024-07-10 | Discharge: 2024-07-11 | Disposition: A | Discharge status: Home / Self Care | Source: Ambulatory Visit | Attending: Emergency Medicine | Admitting: Emergency Medicine

## 2024-07-10 ENCOUNTER — Other Ambulatory Visit: Payer: Self-pay

## 2024-07-10 ENCOUNTER — Encounter: Payer: Self-pay | Admitting: Emergency Medicine

## 2024-07-10 ENCOUNTER — Emergency Department

## 2024-07-10 DIAGNOSIS — R1031 Right lower quadrant pain: Secondary | ICD-10-CM | POA: Diagnosis not present

## 2024-07-10 DIAGNOSIS — R1030 Lower abdominal pain, unspecified: Secondary | ICD-10-CM

## 2024-07-10 DIAGNOSIS — N179 Acute kidney failure, unspecified: Secondary | ICD-10-CM | POA: Insufficient documentation

## 2024-07-10 DIAGNOSIS — I10 Essential (primary) hypertension: Secondary | ICD-10-CM | POA: Diagnosis not present

## 2024-07-10 DIAGNOSIS — D72829 Elevated white blood cell count, unspecified: Secondary | ICD-10-CM | POA: Insufficient documentation

## 2024-07-10 DIAGNOSIS — K529 Noninfective gastroenteritis and colitis, unspecified: Secondary | ICD-10-CM | POA: Insufficient documentation

## 2024-07-10 DIAGNOSIS — E039 Hypothyroidism, unspecified: Secondary | ICD-10-CM | POA: Diagnosis not present

## 2024-07-10 DIAGNOSIS — R1032 Left lower quadrant pain: Secondary | ICD-10-CM | POA: Diagnosis not present

## 2024-07-10 DIAGNOSIS — Z90710 Acquired absence of both cervix and uterus: Secondary | ICD-10-CM | POA: Diagnosis not present

## 2024-07-10 LAB — LIPASE, BLOOD: Lipase: 37 U/L (ref 11–51)

## 2024-07-10 LAB — CBC
HCT: 36.3 % (ref 36.0–46.0)
Hemoglobin: 12.1 g/dL (ref 12.0–15.0)
MCH: 31.1 pg (ref 26.0–34.0)
MCHC: 33.3 g/dL (ref 30.0–36.0)
MCV: 93.3 fL (ref 80.0–100.0)
Platelets: 382 K/uL (ref 150–400)
RBC: 3.89 MIL/uL (ref 3.87–5.11)
RDW: 13 % (ref 11.5–15.5)
WBC: 14 K/uL — ABNORMAL HIGH (ref 4.0–10.5)
nRBC: 0 % (ref 0.0–0.2)

## 2024-07-10 LAB — URINALYSIS, ROUTINE W REFLEX MICROSCOPIC
Bilirubin Urine: NEGATIVE
Glucose, UA: NEGATIVE mg/dL
Hgb urine dipstick: NEGATIVE
Ketones, ur: NEGATIVE mg/dL
Leukocytes,Ua: NEGATIVE
Nitrite: NEGATIVE
Protein, ur: NEGATIVE mg/dL
Specific Gravity, Urine: 1.003 — ABNORMAL LOW (ref 1.005–1.030)
pH: 5 (ref 5.0–8.0)

## 2024-07-10 LAB — COMPREHENSIVE METABOLIC PANEL WITH GFR
ALT: 15 U/L (ref 0–44)
AST: 16 U/L (ref 15–41)
Albumin: 3.6 g/dL (ref 3.5–5.0)
Alkaline Phosphatase: 46 U/L (ref 38–126)
Anion gap: 10 (ref 5–15)
BUN: 18 mg/dL (ref 8–23)
CO2: 25 mmol/L (ref 22–32)
Calcium: 9.1 mg/dL (ref 8.9–10.3)
Chloride: 103 mmol/L (ref 98–111)
Creatinine, Ser: 1.2 mg/dL — ABNORMAL HIGH (ref 0.44–1.00)
GFR, Estimated: 48 mL/min — ABNORMAL LOW (ref >60)
Glucose, Bld: 100 mg/dL — ABNORMAL HIGH (ref 70–99)
Potassium: 4 mmol/L (ref 3.5–5.1)
Sodium: 138 mmol/L (ref 135–145)
Total Bilirubin: 0.9 mg/dL (ref 0.0–1.2)
Total Protein: 6.7 g/dL (ref 6.5–8.1)

## 2024-07-10 MED ORDER — SODIUM CHLORIDE 0.9 % IV BOLUS
1000.0000 mL | Freq: Once | INTRAVENOUS | Status: AC
  Administered 2024-07-10: 1000 mL via INTRAVENOUS

## 2024-07-10 MED ORDER — SODIUM CHLORIDE 0.9 % IV SOLN
500.0000 mg | Freq: Once | INTRAVENOUS | Status: AC
  Administered 2024-07-10: 500 mg via INTRAVENOUS
  Filled 2024-07-10: qty 5

## 2024-07-10 MED ORDER — HYDROCODONE-ACETAMINOPHEN 5-325 MG PO TABS: 1.0000 | ORAL_TABLET | Freq: Three times a day (TID) | ORAL | 0 refills | Status: AC | PRN

## 2024-07-10 MED ORDER — AZITHROMYCIN 500 MG PO TABS
500.0000 mg | ORAL_TABLET | Freq: Every day | ORAL | 0 refills | Status: AC
Start: 2024-07-11 — End: 2024-07-13

## 2024-07-10 MED ORDER — ONDANSETRON 4 MG PO TBDP: 4.0000 mg | ORAL_TABLET | Freq: Three times a day (TID) | ORAL | 0 refills | Status: DC | PRN

## 2024-07-10 MED ORDER — IOHEXOL 300 MG/ML  SOLN
100.0000 mL | Freq: Once | INTRAMUSCULAR | Status: AC | PRN
  Administered 2024-07-10: 100 mL via INTRAVENOUS

## 2024-07-10 NOTE — ED Triage Notes (Signed)
 Patient to ED via POV from [facility] for lower abd pain. Started this AM upon waking up. States hx of IBS and no changes in bowels from her normal diarrhea. Denies V/N.

## 2024-07-10 NOTE — Telephone Encounter (Signed)
 Please call and f/u with her and confirm she was evaluated and confirm doing ok.

## 2024-07-10 NOTE — Discharge Instructions (Addendum)
 Your exam and labs overall reassuring, but you do have elevated white count which concerning for an inflammatory or infectious process.  Your CT scan did confirm distal colitis.  We discussed admission to the hospital, but you feel that you can manage your symptoms at home.  You will be treated with a 3-day course of antibiotic.  Monitor closely and return to the ED for worsening symptoms as discussed.

## 2024-07-10 NOTE — Telephone Encounter (Signed)
 Called pt and she is having abdominal in the whole stomach,and having back pain from top to bottom. Pt stated that is started this am.Pt stated that she would go to [facility] walk in today.

## 2024-07-10 NOTE — ED Notes (Signed)
 Patient ambulated to the bathroom independently.

## 2024-07-10 NOTE — Telephone Encounter (Signed)
 Please call her.  If she is having abdominal pain and back pain - needs to be evaluated to confirm definite diagnosis and best way to treat.  Needs to go ahead and be seen.

## 2024-07-10 NOTE — ED Provider Notes (Addendum)
 [facility] Emergency Department Provider Note     None    (approximate)   History   Abdominal Pain   HPI  Jill Torres is a 73 y.o. female with a history of hypothyroidism, IBS, diverticulosis, HTN, HLD, and GERD, presents to the ED endorsing lower abdominal pain.  Patient describes tenderness to the suprapubic region with referral to the right and left lower regions respectively.  Patient reports onset of symptoms this morning.  She initially presented to [facility], and was referred to the ED for further evaluation.  She denies any changes in her bowel habits.  No reports of any nausea, vomiting, or diarrhea.  She denies any fever, chills, sweats, chest pain, or shortness of breath.  Her history includes diverticulitis as well as removal of her bladder diverticula some years back.  Patient denies any hematuria or urinary retention.  Physical Exam   Triage Vital Signs: ED Triage Vitals [07/10/24 1729]  Encounter Vitals Group     BP (!) 141/102     Girls Systolic BP Percentile      Girls Diastolic BP Percentile      Boys Systolic BP Percentile      Boys Diastolic BP Percentile      Pulse Rate 85     Resp 18     Temp 97.9 F (36.6 C)     Temp Source Oral     SpO2 100 %     Weight 180 lb (81.6 kg)     Height 5' 3 (1.6 m)     Head Circumference      Peak Flow      Pain Score 8     Pain Loc      Pain Education      Exclude from Growth Chart     Most recent vital signs: Vitals:   07/10/24 1729 07/10/24 2156  BP: (!) 141/102 (!) 129/49  Pulse: 85 79  Resp: 18 17  Temp: 97.9 F (36.6 C) 98.1 F (36.7 C)  SpO2: 100% 100%    General Awake, no distress. NAD HEENT NCAT. PERRL. EOMI. No rhinorrhea. Mucous membranes are moist.  CV:  Good peripheral perfusion. RRR RESP:  Normal effort. CTA ABD:  No distention.  Generally soft and nontender.  No rebound, guarding, or rigidity noted.  Patient with reported tenderness over the suprapubic region  as well as a left and right lower quadrants.  No CVA tenderness elicited.  Normal bowel sounds appreciated.   ED Results / Procedures / Treatments   Labs (all labs ordered are listed, but only abnormal results are displayed) Labs Reviewed  COMPREHENSIVE METABOLIC PANEL WITH GFR - Abnormal; Notable for the following components:      Result Value   Glucose, Bld 100 (*)    Creatinine, Ser 1.20 (*)    GFR, Estimated 48 (*)    All other components within normal limits  CBC - Abnormal; Notable for the following components:   WBC 14.0 (*)    All other components within normal limits  URINALYSIS, ROUTINE W REFLEX MICROSCOPIC - Abnormal; Notable for the following components:   Color, Urine STRAW (*)    APPearance CLEAR (*)    Specific Gravity, Urine 1.003 (*)    All other components within normal limits  LIPASE, BLOOD   EKG   RADIOLOGY  I personally viewed and evaluated these images as part of my medical decision making, as well as reviewing the written report by the radiologist.  ED Provider Interpretation: Evidence of focal cecal colitis  CT ABDOMEN PELVIS W CONTRAST Result Date: 07/10/2024 CLINICAL DATA:  Lower abdominal pain for 1 day EXAM: CT ABDOMEN AND PELVIS WITH CONTRAST TECHNIQUE: Multidetector CT imaging of the abdomen and pelvis was performed using the standard protocol following bolus administration of intravenous contrast. RADIATION DOSE REDUCTION: This exam was performed according to the departmental dose-optimization program which includes automated exposure control, adjustment of the mA and/or kV according to patient size and/or use of iterative reconstruction technique. CONTRAST:  OMNIPAQUE  IOHEXOL  300 MG/ML  SOLN COMPARISON:  02/17/2023 FINDINGS: Lower chest: No acute abnormality. Hepatobiliary: No focal liver abnormality is seen. Status post cholecystectomy. No biliary dilatation. Pancreas: Unremarkable. No pancreatic ductal dilatation or surrounding inflammatory  changes. Spleen: Normal in size without focal abnormality. Adrenals/Urinary Tract: Adrenal glands are within normal limits. Kidneys demonstrate a normal enhancement pattern bilaterally. No renal calculi or obstructive changes are seen. The bladder is well distended. Stomach/Bowel: Scattered diverticular changes noted throughout the sigmoid colon. The appendix has been surgically removed. Mild inflammatory changes are noted surrounding the cecum which may represent some very typhlitis. No abscess or perforation is noted. Small bowel shows no obstructive changes. Stomach is within normal limits. Vascular/Lymphatic: Aortic atherosclerosis. No enlarged abdominal or pelvic lymph nodes. Reproductive: Status post hysterectomy. No adnexal masses. Other: No abdominal wall hernia or abnormality. No abdominopelvic ascites. Musculoskeletal: No acute or significant osseous findings. IMPRESSION: Mild pericecal inflammatory change suggestive of focal colitis. No perforation or abscess is seen. No other focal abnormality is noted. Electronically Signed   By: Oneil Devonshire M.D.   On: 07/10/2024 21:52     PROCEDURES:  Critical Care performed: No  Procedures   MEDICATIONS ORDERED IN ED: Medications  azithromycin  (ZITHROMAX ) 500 mg in sodium chloride  0.9 % 250 mL IVPB (500 mg Intravenous New Bag/Given 07/10/24 2326)  iohexol  (OMNIPAQUE ) 300 MG/ML solution 100 mL (100 mLs Intravenous Contrast Given 07/10/24 2132)  sodium chloride  0.9 % bolus 1,000 mL (1,000 mLs Intravenous New Bag/Given 07/10/24 2210)     IMPRESSION / MDM / ASSESSMENT AND PLAN / ED COURSE  I reviewed the triage vital signs and the nursing notes.                              Differential diagnosis includes, but is not limited to, ovarian cyst, ovarian torsion, acute appendicitis, diverticulitis, urinary tract infection/pyelonephritis, bowel obstruction, colitis, renal colic, gastroenteritis, hernia, fibroids, endometriosis, etc.  Patient's  presentation is most consistent with acute complicated illness / injury requiring diagnostic workup.  Patient's diagnosis is consistent with focal colitis of the cecum.  Patient presented endorsing sudden onset of lower abdominal discomfort at 1 AM this morning.  She denies any associated nausea or vomiting.  Patient denies any persistent diarrhea or bowel changes.  She had her typical morning stool which was consistent with the soft stool she experiences with her IBS-D.  No reports of any fevers or chills.  Patient without any anorexia, has continued to eat and drink as expected.  Her labs are overall reassuring with exception of an elevated white count of 14.  No electrolyte abnormalities appreciated.  Patient with a mild AKI CR 1.20 and a GFR noted at 48.  Lab reviewed notes a normal creatinine and BUN.  I discussed the patient's lab and CT findings with family at bedside.  The recommendation was for admission and ongoing evaluation including IV antibiotics and IV  fluids.  Patient has declined admission to the hospital service at this time.  She reports she is stable and feels that she can manage her symptoms at home.  We did discuss treatment with antibiotics.  Patient received initial IV dose of azithromycin , and will be discharged with a prescription for the same.  Patient will be discharged home with prescriptions for Zofran  and hydrocodone  (#9). Patient is to follow up with her PCP and GI specialist as suggested as needed or otherwise directed. Patient is given strict ED precautions to return to the ED for any worsening or new symptoms.  Clinical Course as of 07/10/24 2342  Wed Jul 10, 2024  2216 Patient had CT scan results confirming cecal colitis without evidence of perforation or abscess formation.  I discussed admission to the hospital for ongoing evaluation including IV antibiotics and IV fluid hydration.  Patient declined admission at this time. [JM]  2327 I confirmed the patient's decision to  discharge home with outpatient management.  Patient did agree to an initial IV dose of antibiotic following a fluid bolus. [JM]    Clinical Course User Index [JM] Eunice Winecoff, Candida LULLA Kings, PA-C    FINAL CLINICAL IMPRESSION(S) / ED DIAGNOSES   Final diagnoses:  Colitis     Rx / DC Orders   ED Discharge Orders          Ordered    azithromycin  (ZITHROMAX ) 500 MG tablet  Daily        07/10/24 2312    ondansetron  (ZOFRAN -ODT) 4 MG disintegrating tablet  Every 8 hours PRN        07/10/24 2312    HYDROcodone -acetaminophen  (NORCO/VICODIN) 5-325 MG tablet  3 times daily PRN        07/10/24 2312             Note:  This document was prepared using Dragon voice recognition software and may include unintentional dictation errors.    Loyd Candida LULLA Kings, PA-C 07/10/24 2342    Loyd Candida LULLA Kings, PA-C 07/11/24 1529    Gordan Huxley, MD 07/19/24 [phone]

## 2024-07-11 NOTE — ED Provider Notes (Signed)
-----------------------------------------   12:06 AM on 07/11/2024 -----------------------------------------  Assuming care from [facility].  In short, Jill Torres is a 73 y.o. female with a chief complaint of lower abdominal pain.  Refer to the original H&P for additional details.  The current plan of care is to discharge after completion of antibiotics and fluids.  Patient is aware of the plan of care.   I was informed that the patient completed the fluids and medication without any difficulty.  I was in the middle of a critical care scenario but the patient did not need to speak with me and was discharged without incident with paperwork prepared by the previous provider  Medications  iohexol  (OMNIPAQUE ) 300 MG/ML solution 100 mL (100 mLs Intravenous Contrast Given 07/10/24 2132)  sodium chloride  0.9 % bolus 1,000 mL (0 mLs Intravenous Stopped 07/11/24 0000)  azithromycin  (ZITHROMAX ) 500 mg in sodium chloride  0.9 % 250 mL IVPB (0 mg Intravenous Stopped 07/11/24 0027)     ED Discharge Orders          Ordered    azithromycin  (ZITHROMAX ) 500 MG tablet  Daily        07/10/24 2312    ondansetron  (ZOFRAN -ODT) 4 MG disintegrating tablet  Every 8 hours PRN        07/10/24 2312    HYDROcodone -acetaminophen  (NORCO/VICODIN) 5-325 MG tablet  3 times daily PRN        07/10/24 2312           Final diagnoses:  Colitis  Lower abdominal pain     Gordan Huxley, MD 07/11/24 [phone]

## 2024-07-11 NOTE — ED Notes (Signed)
 Patient states she is ready to go home. Patient in driving home and states she feels safe driving.

## 2024-07-11 NOTE — Telephone Encounter (Signed)
 Called to follow up with patient. She is feeling some better today. Received IV abx in the ED last night. Due for oral abx this PM. Has not picked up prescriptions yet. Will let us  know if she needs anything

## 2024-07-15 ENCOUNTER — Ambulatory Visit: Admitting: Internal Medicine

## 2024-07-27 DIAGNOSIS — G4733 Obstructive sleep apnea (adult) (pediatric): Secondary | ICD-10-CM | POA: Diagnosis not present

## 2024-08-05 DIAGNOSIS — R1030 Lower abdominal pain, unspecified: Secondary | ICD-10-CM | POA: Diagnosis not present

## 2024-08-05 DIAGNOSIS — R933 Abnormal findings on diagnostic imaging of other parts of digestive tract: Secondary | ICD-10-CM | POA: Diagnosis not present

## 2024-08-05 DIAGNOSIS — K580 Irritable bowel syndrome with diarrhea: Secondary | ICD-10-CM | POA: Diagnosis not present

## 2024-08-08 ENCOUNTER — Encounter: Payer: Self-pay | Admitting: Sleep Medicine

## 2024-08-08 ENCOUNTER — Ambulatory Visit: Admitting: Sleep Medicine

## 2024-08-08 VITALS — BP 178/90 | HR 66 | Temp 97.5°F | Ht 63.0 in | Wt 178.4 lb

## 2024-08-08 DIAGNOSIS — I10 Essential (primary) hypertension: Secondary | ICD-10-CM | POA: Diagnosis not present

## 2024-08-08 DIAGNOSIS — E66811 Obesity, class 1: Secondary | ICD-10-CM | POA: Diagnosis not present

## 2024-08-08 DIAGNOSIS — G4733 Obstructive sleep apnea (adult) (pediatric): Secondary | ICD-10-CM

## 2024-08-08 DIAGNOSIS — Z6831 Body mass index (BMI) 31.0-31.9, adult: Secondary | ICD-10-CM

## 2024-08-08 NOTE — Patient Instructions (Addendum)

## 2024-08-08 NOTE — Progress Notes (Signed)
 Name:Jill Torres MRN: [id number] DOB: Jul 31, 1951   CHIEF COMPLAINT:  CPAP F/U    HISTORY OF PRESENT ILLNESS: Jill Torres is a 73 y.o. w/ a h/o OSA, HTN, hyperlipidemia and obesity who presents for CPAP F/U visit. Reports using CPAP therapy every night, which is confirmed by compliance data. She is currently using the Airfit N30i nasal mask, which is comfortable. Reports feeling significantly more refreshed upon awakening with CPAP therapy. Denies any issues at this time.    EPWORTH SLEEP SCORE 7    01/16/2024   10:16 AM  Results of the Epworth flowsheet  Sitting and reading 2  Watching TV 2  Sitting, inactive in a public place (e.g. a theatre or a meeting) 0  As a passenger in a car for an hour without a break 1  Lying down to rest in the afternoon when circumstances permit 2  Sitting and talking to someone 0  Sitting quietly after a lunch without alcohol 0  In a car, while stopped for a few minutes in traffic 0  Total score 7    PAST MEDICAL HISTORY :   has a past medical history of Allergy, Arthritis, Depression, Diverticulosis of colon (without mention of hemorrhage), GERD (gastroesophageal reflux disease), History of chicken pox, History of shingles (may 2013), HLD (hyperlipidemia), HTN (hypertension), Irritable bowel syndrome, and Unspecified hypothyroidism.  has a past surgical history that includes Cesarean section; Cholecystectomy; Dilation and curettage of uterus; Partial hysterectomy (1983); Bladder diverticulectomy (1990); Nasal septum surgery (1999); Appendectomy (1974); Abdominal hysterectomy (1983); and Direct laryngoscopy (Right, 06/11/2015). Prior to Admission medications   Medication Sig Start Date End Date Taking? Authorizing Provider  amLODipine  (NORVASC ) 2.5 MG tablet Take 1 tablet (2.5 mg total) by mouth daily. 12/29/23  Yes Glendia Shad, MD  Ascorbic Acid (VITAMIN C) 1000 MG tablet Take 1,000 mg by mouth daily.   Yes [provider]   Azelastine  HCl 137 MCG/SPRAY SOLN PLACE 1 SPRAY IN BOTH NOSTRILS TWICE A DAY AS DIRECTED 06/12/24  Yes Glendia Shad, MD  bifidobacterium infantis (ALIGN) capsule Take 1 capsule by mouth daily.   Yes [provider]  calcium -vitamin D (OSCAL WITH D 500-200) 500-200 MG-UNIT per tablet Take 1 tablet by mouth daily.   Yes [provider]  Coenzyme Q10 (COQ10 PO) Take by mouth daily.   Yes [provider]  fish oil-omega-3 fatty acids 1000 MG capsule Take 2 g by mouth daily.   Yes [provider]  fluticasone  (FLONASE ) 50 MCG/ACT nasal spray SPRAY 2 SPRAYS INTO EACH NOSTRIL EVERY DAY 02/22/24  Yes Scott, Charlene, MD  multivitamin (THERAGRAN) per tablet Take 1 tablet by mouth daily.   Yes [provider]  ondansetron  (ZOFRAN -ODT) 4 MG disintegrating tablet Take 1 tablet (4 mg total) by mouth every 8 (eight) hours as needed for nausea or vomiting. 07/10/24  Yes Menshew, Candida LULLA Kings, PA-C  polyethylene glycol powder (GLYCOLAX/MIRALAX) 17 GM/SCOOP powder Take 17 g by mouth as needed.   Yes [provider]  psyllium (REGULOID) 0.52 g capsule Take 0.52 g by mouth daily as needed.   Yes [provider]  rosuvastatin  (CRESTOR ) 20 MG tablet Take 1 tablet (20 mg total) by mouth daily. 12/29/23  Yes Glendia Shad, MD   No Known Allergies  FAMILY HISTORY:  family history includes Arthritis in her mother; Atrial fibrillation in her mother; Colon polyps in her brother and mother; Deep vein thrombosis in her brother; Diabetes in her maternal aunt,  maternal grandfather, maternal uncle, and mother; Heart disease in her brother, brother, father, and mother; Hyperlipidemia in her father and mother; Hypertension in her mother; Rheumatic fever in her sister; Stroke in her father. SOCIAL HISTORY:  reports that she has never smoked. She has never used smokeless tobacco. She reports that she does not drink alcohol and does not use drugs.   Review of  Systems:  Gen:  Denies  fever, sweats, chills weight loss  HEENT: Denies blurred vision, double vision, ear pain, eye pain, hearing loss, nose bleeds, sore throat Cardiac:  No dizziness, chest pain or heaviness, chest tightness,edema, No JVD Resp:   No cough, -sputum production, -shortness of breath,-wheezing, -hemoptysis,  Gi: Denies swallowing difficulty, stomach pain, nausea or vomiting, diarrhea, constipation, bowel incontinence Gu:  Denies bladder incontinence, burning urine Ext:   Denies Joint pain, stiffness or swelling Skin: Denies  skin rash, easy bruising or bleeding or hives Endoc:  Denies polyuria, polydipsia , polyphagia or weight change Psych:   Denies depression, insomnia or hallucinations  Other:  All other systems negative  VITAL SIGNS: BP (!) 190/98 Comment: Left arm  Pulse 66   Temp (!) 97.5 F (36.4 C)   Ht 5' 3 (1.6 m)   Wt 178 lb 6.4 oz (80.9 kg)   LMP 12/12/1981   SpO2 96%   BMI 31.60 kg/m    Physical Examination:   General Appearance: No distress  EYES PERRLA, EOM intact.   NECK Supple, No JVD Pulmonary: normal breath sounds, No wheezing.  CardiovascularNormal S1,S2.  No m/r/g.   Abdomen: Benign, Soft, non-tender. Skin:   warm, no rashes, no ecchymosis  Extremities: normal, no cyanosis, clubbing. Neuro:without focal findings,  speech normal  PSYCHIATRIC: Mood, affect within normal limits.   ASSESSMENT AND PLAN  OSA Patient is using and benefiting from CPAP therapy. Discussed the consequences of untreated sleep apnea. Advised not to drive drowsy for safety of patient and others. Will follow up in 3 months.   HTN BP elevated, advised patient to follow up with her PCP for further management.   Obesity Counseled patient on diet and lifestyle modification.    Patient  satisfied with Plan of action and management. All questions answered  I spent a total of 34 minutes reviewing chart data, face-to-face evaluation with the patient, counseling  and coordination of care as detailed above.    Argentina Kosch, M.D.  Sleep Medicine [facility]

## 2024-08-26 ENCOUNTER — Other Ambulatory Visit (INDEPENDENT_AMBULATORY_CARE_PROVIDER_SITE_OTHER)

## 2024-08-26 DIAGNOSIS — E7800 Pure hypercholesterolemia, unspecified: Secondary | ICD-10-CM

## 2024-08-26 DIAGNOSIS — I10 Essential (primary) hypertension: Secondary | ICD-10-CM | POA: Diagnosis not present

## 2024-08-26 LAB — BASIC METABOLIC PANEL WITH GFR
BUN: 13 mg/dL (ref 6–23)
CO2: 27 meq/L (ref 19–32)
Calcium: 9.6 mg/dL (ref 8.4–10.5)
Chloride: 104 meq/L (ref 96–112)
Creatinine, Ser: 1.03 mg/dL (ref 0.40–1.20)
GFR: 54.48 mL/min — ABNORMAL LOW (ref >60.00)
Glucose, Bld: 88 mg/dL (ref 70–99)
Potassium: 4.5 meq/L (ref 3.5–5.1)
Sodium: 139 meq/L (ref 135–145)

## 2024-08-26 LAB — HEPATIC FUNCTION PANEL
ALT: 12 U/L (ref 0–35)
AST: 13 U/L (ref 0–37)
Albumin: 4.2 g/dL (ref 3.5–5.2)
Alkaline Phosphatase: 57 U/L (ref 39–117)
Bilirubin, Direct: 0.1 mg/dL (ref 0.0–0.3)
Total Bilirubin: 0.6 mg/dL (ref 0.2–1.2)
Total Protein: 6.7 g/dL (ref 6.0–8.3)

## 2024-08-26 LAB — LIPID PANEL
Cholesterol: 253 mg/dL — ABNORMAL HIGH (ref 0–200)
HDL: 60.2 mg/dL (ref >39.00)
LDL Cholesterol: 164 mg/dL — ABNORMAL HIGH (ref 0–99)
NonHDL: 193.02
Total CHOL/HDL Ratio: 4
Triglycerides: 146 mg/dL (ref 0.0–149.0)
VLDL: 29.2 mg/dL (ref 0.0–40.0)

## 2024-08-27 ENCOUNTER — Ambulatory Visit: Payer: Self-pay | Admitting: Internal Medicine

## 2024-08-27 DIAGNOSIS — G4733 Obstructive sleep apnea (adult) (pediatric): Secondary | ICD-10-CM | POA: Diagnosis not present

## 2024-08-29 ENCOUNTER — Ambulatory Visit: Admitting: Internal Medicine

## 2024-08-29 VITALS — BP 126/72 | HR 70 | Resp 16 | Ht 63.0 in | Wt 179.6 lb

## 2024-08-29 DIAGNOSIS — G4733 Obstructive sleep apnea (adult) (pediatric): Secondary | ICD-10-CM | POA: Diagnosis not present

## 2024-08-29 DIAGNOSIS — Z860100 Personal history of colon polyps, unspecified: Secondary | ICD-10-CM

## 2024-08-29 DIAGNOSIS — I10 Essential (primary) hypertension: Secondary | ICD-10-CM | POA: Diagnosis not present

## 2024-08-29 DIAGNOSIS — E7800 Pure hypercholesterolemia, unspecified: Secondary | ICD-10-CM | POA: Diagnosis not present

## 2024-08-29 DIAGNOSIS — R5383 Other fatigue: Secondary | ICD-10-CM

## 2024-08-29 DIAGNOSIS — R1032 Left lower quadrant pain: Secondary | ICD-10-CM | POA: Diagnosis not present

## 2024-08-29 NOTE — Progress Notes (Signed)
 Subjective:    Patient ID: Jill Torres, female    DOB: 1951-01-18, 73 y.o.   MRN: [id number]  Patient here for  Chief Complaint  Patient presents with   Medical Management of Chronic Issues    HPI Here for a scheduled follow up - follow up regarding sleep apnea, hypertension and hypercholesterolemia. Saw Dr Jess - f/u OSA. Recommended CPAP. Had f/u with Dr Jess 08/08/24 - compliant with cpap. Saw Dr Senior 07/08/24 - recurrent rhinosinusitis and ear fullness and pressure. He recommended nasal irrigations twice daily and astelin  nasal spray. Also can supplement with flonase  prn. Was seen ER 07/10/24 - lower abdominal pain. CT - suggestive of focal colitis. Per note, recommended IV abx and IVFs. In ER - received initial dose IV dose of azithromycin . Discharged with zofran , hydrocodone  and azithromycin . Had f/u with GI 08/05/24 - recommended checking fecal calprotectin and colonoscopy. Also recommended celiac panel and start daily psyllium, IB GARD prn. Celiac panel - negative. Calprotectin - 5. She is feeling better. Bowels are moving. No diarrhea or constipation. No blood. Still with an uncomfortable sensation in LLQ , but overall improved. No nausea or vomiting. No upper symptoms. Had colonoscopy planned for next week. Astelin  is working well for her. She is using cpap regularly and feels more rested in am.    Past Medical History:  Diagnosis Date   Allergy    Arthritis    Depression    Diverticulosis of colon (without mention of hemorrhage)    GERD (gastroesophageal reflux disease)    History of chicken pox    History of shingles may 2013   HLD (hyperlipidemia)    HTN (hypertension)    Irritable bowel syndrome    Unspecified hypothyroidism    Past Surgical History:  Procedure Laterality Date   ABDOMINAL HYSTERECTOMY  1983   due to heavy bleeding & ovary cyst   APPENDECTOMY  1974   BLADDER DIVERTICULECTOMY  1990   CESAREAN SECTION     x 2   CHOLECYSTECTOMY     DILATION AND  CURETTAGE OF UTERUS     x 2   DIRECT LARYNGOSCOPY Right 06/11/2015   Procedure: suspension microdirect laryngoscopy with biopsy of right tongue base;  Surgeon: Carolee Hunter, MD;  Location: [facility] ORS;  Service: ENT;  Laterality: Right;   NASAL SEPTUM SURGERY  1999   PARTIAL HYSTERECTOMY  1983   cyst on ovary and heavy bleeding    Family History  Problem Relation Age of Onset   Arthritis Mother    Hypertension Mother    Hyperlipidemia Mother    Atrial fibrillation Mother    Heart disease Mother    Diabetes Mother    Colon polyps Mother    Hyperlipidemia Father    Heart disease Father        s/p CABG   Stroke Father    Rheumatic fever Sister        s/p valve replacement   Deep vein thrombosis Brother        after immobilization    Colon polyps Brother    Heart disease Brother    Diabetes Maternal Grandfather    Heart disease Brother    Diabetes Maternal Aunt    Diabetes Maternal Uncle    Breast cancer Neg Hx    Colon cancer Neg Hx    Esophageal cancer Neg Hx    Rectal cancer Neg Hx    Stomach cancer Neg Hx    Social History   Socioeconomic History  Marital status: Single    Spouse name: Not on file   Number of children: 2   Years of education: Not on file   Highest education level: Not on file  Occupational History   Occupation: retired    Comment: Primary school teacher products   Tobacco Use   Smoking status: Never   Smokeless tobacco: Never  Substance and Sexual Activity   Alcohol use: No    Alcohol/week: 0.0 standard drinks of alcohol   Drug use: No   Sexual activity: Not on file  Other Topics Concern   Not on file  Social History Narrative   No regular exercise   Lives in noisy environment- very stressful    Daily caffeine use    Social Drivers of Corporate investment banker Strain: Low Risk  (09/04/2023)   Overall Financial Resource Strain (CARDIA)    Difficulty of Paying Living Expenses: Not hard at all  Food Insecurity: No Food Insecurity (09/04/2023)    Hunger Vital Sign    Worried About Running Out of Food in the Last Year: Never true    Ran Out of Food in the Last Year: Never true  Transportation Needs: No Transportation Needs (09/04/2023)   PRAPARE - Administrator, Civil Service (Medical): No    Lack of Transportation (Non-Medical): No  Physical Activity: Inactive (09/04/2023)   Exercise Vital Sign    Days of Exercise per Week: 0 days    Minutes of Exercise per Session: 0 min  Stress: No Stress Concern Present (09/04/2023)   [employer] of Occupational Health - Occupational Stress Questionnaire    Feeling of Stress : Not at all  Social Connections: Moderately Isolated (09/04/2023)   Social Connection and Isolation Panel    Frequency of Communication with Friends and Family: More than three times a week    Frequency of Social Gatherings with Friends and Family: More than three times a week    Attends Religious Services: More than 4 times per year    Active Member of [employer] or Organizations: No    Attends Banker Meetings: Never    Marital Status: Divorced     Review of Systems  Constitutional:  Negative for appetite change and unexpected weight change.  HENT:  Negative for congestion and sinus pressure.   Respiratory:  Negative for cough, chest tightness and shortness of breath.   Cardiovascular:  Negative for chest pain, palpitations and leg swelling.  Gastrointestinal:  Negative for constipation, diarrhea, nausea and vomiting.       Uncomfortable - left lower quadrant   Genitourinary:  Negative for difficulty urinating and dysuria.  Musculoskeletal:  Negative for joint swelling and myalgias.  Skin:  Negative for color change and rash.  Neurological:  Negative for dizziness and headaches.  Psychiatric/Behavioral:  Negative for agitation and dysphoric mood.        Objective:     BP 126/72   Pulse 70   Resp 16   Ht 5' 3 (1.6 m)   Wt 179 lb 9.6 oz (81.5 kg)   LMP 12/12/1981   SpO2 98%    BMI 31.81 kg/m  Wt Readings from Last 3 Encounters:  08/29/24 179 lb 9.6 oz (81.5 kg)  08/08/24 178 lb 6.4 oz (80.9 kg)  07/10/24 180 lb (81.6 kg)    Physical Exam Vitals reviewed.  Constitutional:      General: She is not in acute distress.    Appearance: Normal appearance.  HENT:  Head: Normocephalic and atraumatic.     Right Ear: External ear normal.     Left Ear: External ear normal.     Mouth/Throat:     Pharynx: No oropharyngeal exudate or posterior oropharyngeal erythema.  Eyes:     General: No scleral icterus.       Right eye: No discharge.        Left eye: No discharge.     Conjunctiva/sclera: Conjunctivae normal.  Neck:     Thyroid : No thyromegaly.  Cardiovascular:     Rate and Rhythm: Normal rate and regular rhythm.  Pulmonary:     Effort: No respiratory distress.     Breath sounds: Normal breath sounds. No wheezing.  Abdominal:     General: Bowel sounds are normal.     Palpations: Abdomen is soft.     Comments: Minimal tenderness LLQ.   Musculoskeletal:        General: No swelling or tenderness.     Cervical back: Neck supple. No tenderness.  Lymphadenopathy:     Cervical: No cervical adenopathy.  Skin:    Findings: No erythema or rash.  Neurological:     Mental Status: She is alert.  Psychiatric:        Mood and Affect: Mood normal.        Behavior: Behavior normal.         Outpatient Encounter Medications as of 08/29/2024  Medication Sig   amLODipine  (NORVASC ) 2.5 MG tablet Take 1 tablet (2.5 mg total) by mouth daily.   Ascorbic Acid (VITAMIN C) 1000 MG tablet Take 1,000 mg by mouth daily.   Azelastine  HCl 137 MCG/SPRAY SOLN PLACE 1 SPRAY IN BOTH NOSTRILS TWICE A DAY AS DIRECTED   bifidobacterium infantis (ALIGN) capsule Take 1 capsule by mouth daily.   calcium -vitamin D (OSCAL WITH D 500-200) 500-200 MG-UNIT per tablet Take 1 tablet by mouth daily.   Coenzyme Q10 (COQ10 PO) Take by mouth daily.   fish oil-omega-3 fatty acids 1000 MG  capsule Take 2 g by mouth daily.   fluticasone  (FLONASE ) 50 MCG/ACT nasal spray SPRAY 2 SPRAYS INTO EACH NOSTRIL EVERY DAY   multivitamin (THERAGRAN) per tablet Take 1 tablet by mouth daily.   polyethylene glycol powder (GLYCOLAX/MIRALAX) 17 GM/SCOOP powder Take 17 g by mouth as needed.   psyllium (REGULOID) 0.52 g capsule Take 0.52 g by mouth daily as needed.   rosuvastatin  (CRESTOR ) 20 MG tablet Take 1 tablet (20 mg total) by mouth daily.   [DISCONTINUED] ondansetron  (ZOFRAN -ODT) 4 MG disintegrating tablet Take 1 tablet (4 mg total) by mouth every 8 (eight) hours as needed for nausea or vomiting.   No facility-administered encounter medications on file as of 08/29/2024.     Lab Results  Component Value Date   WBC 14.0 (H) 07/10/2024   HGB 12.1 07/10/2024   HCT 36.3 07/10/2024   PLT 382 07/10/2024   GLUCOSE 88 08/26/2024   CHOL 253 (H) 08/26/2024   TRIG 146.0 08/26/2024   HDL 60.20 08/26/2024   LDLDIRECT 81.0 07/22/2022   LDLCALC 164 (H) 08/26/2024   ALT 12 08/26/2024   AST 13 08/26/2024   NA 139 08/26/2024   K 4.5 08/26/2024   CL 104 08/26/2024   CREATININE 1.03 08/26/2024   BUN 13 08/26/2024   CO2 27 08/26/2024   TSH 0.82 12/29/2023    CT ABDOMEN PELVIS W CONTRAST Result Date: 07/10/2024 CLINICAL DATA:  Lower abdominal pain for 1 day EXAM: CT ABDOMEN AND PELVIS WITH CONTRAST TECHNIQUE: Multidetector  CT imaging of the abdomen and pelvis was performed using the standard protocol following bolus administration of intravenous contrast. RADIATION DOSE REDUCTION: This exam was performed according to the departmental dose-optimization program which includes automated exposure control, adjustment of the mA and/or kV according to patient size and/or use of iterative reconstruction technique. CONTRAST:  OMNIPAQUE  IOHEXOL  300 MG/ML  SOLN COMPARISON:  02/17/2023 FINDINGS: Lower chest: No acute abnormality. Hepatobiliary: No focal liver abnormality is seen. Status post cholecystectomy.  No biliary dilatation. Pancreas: Unremarkable. No pancreatic ductal dilatation or surrounding inflammatory changes. Spleen: Normal in size without focal abnormality. Adrenals/Urinary Tract: Adrenal glands are within normal limits. Kidneys demonstrate a normal enhancement pattern bilaterally. No renal calculi or obstructive changes are seen. The bladder is well distended. Stomach/Bowel: Scattered diverticular changes noted throughout the sigmoid colon. The appendix has been surgically removed. Mild inflammatory changes are noted surrounding the cecum which may represent some very typhlitis. No abscess or perforation is noted. Small bowel shows no obstructive changes. Stomach is within normal limits. Vascular/Lymphatic: Aortic atherosclerosis. No enlarged abdominal or pelvic lymph nodes. Reproductive: Status post hysterectomy. No adnexal masses. Other: No abdominal wall hernia or abnormality. No abdominopelvic ascites. Musculoskeletal: No acute or significant osseous findings. IMPRESSION: Mild pericecal inflammatory change suggestive of focal colitis. No perforation or abscess is seen. No other focal abnormality is noted. Electronically Signed   By: Oneil Devonshire M.D.   On: 07/10/2024 21:52       Assessment & Plan:  OSA (obstructive sleep apnea) Assessment & Plan:  Saw Dr Jess - f/u OSA. Recommended CPAP. Had f/u with Dr Jess 08/08/24 - compliant with cpap.    Hypercholesterolemia Assessment & Plan: On crestor .  Low cholesterol diet and exercise.  Follow lipid panel.   Lab Results  Component Value Date   CHOL 253 (H) 08/26/2024   HDL 60.20 08/26/2024   LDLCALC 164 (H) 08/26/2024   LDLDIRECT 81.0 07/22/2022   TRIG 146.0 08/26/2024   CHOLHDL 4 08/26/2024     Orders: -     Hepatic function panel; Future -     Lipid panel; Future  Hypertension, unspecified type Assessment & Plan: Continue amlodipine .  Follow pressures.  Follow metabolic panel. No change in medication today.   Orders: -      Basic metabolic panel with GFR; Future  Other fatigue -     TSH; Future  Left lower quadrant pain Assessment & Plan: Was seen ER 07/10/24 - lower abdominal pain. CT - suggestive of focal colitis. Per note, recommended IV abx and IVFs. In ER - received initial dose IV dose of azithromycin . Discharged with zofran , hydrocodone  and azithromycin . Had f/u with GI 08/05/24 - recommended checking fecal calprotectin and colonoscopy. Also recommended celiac panel and start daily psyllium, IB GARD prn. Celiac panel - negative. Calprotectin - 5. She is feeling better. Bowels are moving. No diarrhea or constipation. No blood. Still with an uncomfortable sensation in LLQ , but overall improved. No nausea or vomiting. No upper symptoms. Had colonoscopy planned for next week.    History of colonic polyps Assessment & Plan: 02/22/19 - cecal polyp.  Recommended f/u colonoscopy in 10 years.        Allena Hamilton, MD

## 2024-09-02 ENCOUNTER — Ambulatory Visit: Payer: Self-pay

## 2024-09-02 DIAGNOSIS — K5730 Diverticulosis of large intestine without perforation or abscess without bleeding: Secondary | ICD-10-CM | POA: Diagnosis not present

## 2024-09-02 DIAGNOSIS — K640 First degree hemorrhoids: Secondary | ICD-10-CM | POA: Diagnosis not present

## 2024-09-02 DIAGNOSIS — R933 Abnormal findings on diagnostic imaging of other parts of digestive tract: Secondary | ICD-10-CM | POA: Diagnosis not present

## 2024-09-07 ENCOUNTER — Encounter: Payer: Self-pay | Admitting: Internal Medicine

## 2024-09-07 NOTE — Assessment & Plan Note (Signed)
 Saw Dr Jess - f/u OSA. Recommended CPAP. Had f/u with Dr Jess 08/08/24 - compliant with cpap.

## 2024-09-07 NOTE — Assessment & Plan Note (Signed)
02/22/19 - cecal polyp.  Recommended f/u colonoscopy in 10 years.   ?

## 2024-09-07 NOTE — Assessment & Plan Note (Signed)
 Was seen ER 07/10/24 - lower abdominal pain. CT - suggestive of focal colitis. Per note, recommended IV abx and IVFs. In ER - received initial dose IV dose of azithromycin . Discharged with zofran , hydrocodone  and azithromycin . Had f/u with GI 08/05/24 - recommended checking fecal calprotectin and colonoscopy. Also recommended celiac panel and start daily psyllium, IB GARD prn. Celiac panel - negative. Calprotectin - 5. She is feeling better. Bowels are moving. No diarrhea or constipation. No blood. Still with an uncomfortable sensation in LLQ , but overall improved. No nausea or vomiting. No upper symptoms. Had colonoscopy planned for next week.

## 2024-09-07 NOTE — Assessment & Plan Note (Signed)
 Continue amlodipine . Follow pressures. Follow metabolic panel. No change in medication today.

## 2024-09-07 NOTE — Assessment & Plan Note (Signed)
 On crestor .  Low cholesterol diet and exercise.  Follow lipid panel.   Lab Results  Component Value Date   CHOL 253 (H) 08/26/2024   HDL 60.20 08/26/2024   LDLCALC 164 (H) 08/26/2024   LDLDIRECT 81.0 07/22/2022   TRIG 146.0 08/26/2024   CHOLHDL 4 08/26/2024

## 2024-09-09 ENCOUNTER — Ambulatory Visit (INDEPENDENT_AMBULATORY_CARE_PROVIDER_SITE_OTHER): Payer: Medicare HMO | Admitting: *Deleted

## 2024-09-09 ENCOUNTER — Other Ambulatory Visit: Payer: Self-pay | Admitting: Internal Medicine

## 2024-09-09 VITALS — Ht 63.0 in | Wt 179.0 lb

## 2024-09-09 DIAGNOSIS — Z1231 Encounter for screening mammogram for malignant neoplasm of breast: Secondary | ICD-10-CM

## 2024-09-09 DIAGNOSIS — Z0000 Encounter for general adult medical examination without abnormal findings: Secondary | ICD-10-CM

## 2024-09-09 NOTE — Patient Instructions (Addendum)
 Jill Torres,  Thank you for taking the time for your Medicare Wellness Visit. I appreciate your continued commitment to your health goals. Please review the care plan we discussed, and feel free to reach out if I can assist you further.  Medicare recommends these wellness visits once per year to help you and your care team stay ahead of potential health issues. These visits are designed to focus on prevention, allowing your provider to concentrate on managing your acute and chronic conditions during your regular appointments.  Please note that Annual Wellness Visits do not include a physical exam. Some assessments may be limited, especially if the visit was conducted virtually. If needed, we may recommend a separate in-person follow-up with your provider.  Ongoing Care Seeing your primary care provider every 3 to 6 months helps us  monitor your health and provide consistent, personalized care.   Remember to check your schedule and call the office and schedule a flu vaccines. You have an order for:  []   2D Mammogram  [x]   3D Mammogram  []   Bone Density     Please call for appointment:  [facility]  [address] [place] [address] [phone]   Make sure to wear two-piece clothing.  No lotions, powders, or deodorants the day of the appointment. Make sure to bring picture ID and insurance card.  Bring list of medications you are currently taking including any supplements.    Referrals If a referral was made during today's visit and you haven't received any updates within two weeks, please contact the referred provider directly to check on the status.  Recommended Screenings:  Health Maintenance  Topic Date Due   Zoster (Shingles) Vaccine (1 of 2) 11/28/2024*   Flu Shot  03/11/2025*   Breast Cancer Screening  11/20/2024   Medicare Annual Wellness Visit  09/09/2025   Colon Cancer Screening  09/04/2034   Pneumococcal  Vaccine for age over 35  Completed   DEXA scan (bone density measurement)  Completed   Hepatitis C Screening  Completed   HPV Vaccine  Aged Out   Meningitis B Vaccine  Aged Out   DTaP/Tdap/Td vaccine  Discontinued   COVID-19 Vaccine  Discontinued  *Topic was postponed. The date shown is not the original due date.       09/09/2024    9:05 AM  Advanced Directives  Does Patient Have a Medical Advance Directive? No  Would patient like information on creating a medical advance directive? No - Patient declined   Advance Care Planning is important because it: Ensures you receive medical care that aligns with your values, goals, and preferences. Provides guidance to your family and loved ones, reducing the emotional burden of decision-making during critical moments.  Vision: Annual vision screenings are recommended for early detection of glaucoma, cataracts, and diabetic retinopathy. These exams can also reveal signs of chronic conditions such as diabetes and high blood pressure.  Dental: Annual dental screenings help detect early signs of oral cancer, gum disease, and other conditions linked to overall health, including heart disease and diabetes.  Please see the attached documents for additional preventive care recommendations.

## 2024-09-09 NOTE — Progress Notes (Signed)
 Subjective:   Jill Torres is a 73 y.o. who presents for a Medicare Wellness preventive visit.  As a reminder, Annual Wellness Visits don't include a physical exam, and some assessments may be limited, especially if this visit is performed virtually. We may recommend an in-person follow-up visit with your provider if needed.  Visit Complete: Virtual I connected with  Jill Torres on 09/09/24 by a audio enabled telemedicine application and verified that I am speaking with the correct person using two identifiers.  Patient Location: Home  Provider Location: Home Office  I discussed the limitations of evaluation and management by telemedicine. The patient expressed understanding and agreed to proceed.  Vital Signs: Because this visit was a virtual/telehealth visit, some criteria may be missing or patient reported. Any vitals not documented were not able to be obtained and vitals that have been documented are patient reported.  VideoDeclined- This patient declined Librarian, academic. Therefore the visit was completed with audio only.  Persons Participating in Visit: Patient.  AWV Questionnaire: No: Patient Medicare AWV questionnaire was not completed prior to this visit.  Cardiac Risk Factors include: advanced age (>49men, >36 women);dyslipidemia;hypertension;obesity (BMI >30kg/m2)     Objective:    Today's Vitals   09/09/24 0852  Weight: 179 lb (81.2 kg)  Height: 5' 3 (1.6 m)   Body mass index is 31.71 kg/m.     09/09/2024    9:05 AM 07/10/2024    5:30 PM 09/04/2023    9:12 AM 02/10/2022    8:39 AM 02/09/2021    8:47 AM 02/07/2020    8:37 AM 06/11/2015    8:14 AM  Advanced Directives  Does Patient Have a Medical Advance Directive? No No No No No No No   Would patient like information on creating a medical advance directive? No - Patient declined  No - Patient declined No - Patient declined No - Patient declined No - Patient declined No -  patient declined information      Data saved with a previous flowsheet row definition    Current Medications (verified) Outpatient Encounter Medications as of 09/09/2024  Medication Sig   amLODipine  (NORVASC ) 2.5 MG tablet Take 1 tablet (2.5 mg total) by mouth daily.   Ascorbic Acid (VITAMIN C) 1000 MG tablet Take 1,000 mg by mouth daily.   Azelastine  HCl 137 MCG/SPRAY SOLN PLACE 1 SPRAY IN BOTH NOSTRILS TWICE A DAY AS DIRECTED   bifidobacterium infantis (ALIGN) capsule Take 1 capsule by mouth daily.   calcium -vitamin D (OSCAL WITH D 500-200) 500-200 MG-UNIT per tablet Take 1 tablet by mouth daily.   Coenzyme Q10 (COQ10 PO) Take by mouth daily.   fish oil-omega-3 fatty acids 1000 MG capsule Take 2 g by mouth daily.   fluticasone  (FLONASE ) 50 MCG/ACT nasal spray SPRAY 2 SPRAYS INTO EACH NOSTRIL EVERY DAY   multivitamin (THERAGRAN) per tablet Take 1 tablet by mouth daily.   polyethylene glycol powder (GLYCOLAX/MIRALAX) 17 GM/SCOOP powder Take 17 g by mouth as needed.   rosuvastatin  (CRESTOR ) 20 MG tablet Take 1 tablet (20 mg total) by mouth daily.   Simethicone (GAS-X PO) Take by mouth daily as needed.   psyllium (REGULOID) 0.52 g capsule Take 0.52 g by mouth daily as needed. (Patient not taking: Reported on 09/09/2024)   No facility-administered encounter medications on file as of 09/09/2024.    Allergies (verified) Patient has no known allergies.   History: Past Medical History:  Diagnosis Date   Allergy  Arthritis    Depression    Diverticulosis of colon (without mention of hemorrhage)    GERD (gastroesophageal reflux disease)    History of chicken pox    History of shingles may 2013   HLD (hyperlipidemia)    HTN (hypertension)    Irritable bowel syndrome    Unspecified hypothyroidism    Past Surgical History:  Procedure Laterality Date   ABDOMINAL HYSTERECTOMY  1983   due to heavy bleeding & ovary cyst   APPENDECTOMY  1974   BLADDER DIVERTICULECTOMY  1990   CESAREAN  SECTION     x 2   CHOLECYSTECTOMY     DILATION AND CURETTAGE OF UTERUS     x 2   DIRECT LARYNGOSCOPY Right 06/11/2015   Procedure: suspension microdirect laryngoscopy with biopsy of right tongue base;  Surgeon: Carolee Hunter, MD;  Location: [facility] OR[facility];  Service: ENT;  Laterality: Right;   NASAL SEPTUM SURGERY  1999   PARTIAL HYSTERECTOMY  1983   cyst on ovary and heavy bleeding    Family History  Problem Relation Age of Onset   Arthritis Mother    Hypertension Mother    Hyperlipidemia Mother    Atrial fibrillation Mother    Heart disease Mother    Diabetes Mother    Colon polyps Mother    Hyperlipidemia Father    Heart disease Father        s/p CABG   Stroke Father    Rheumatic fever Sister        s/p valve replacement   Deep vein thrombosis Brother        after immobilization    Colon polyps Brother    Heart disease Brother    Diabetes Maternal Grandfather    Heart disease Brother    Diabetes Maternal Aunt    Diabetes Maternal Uncle    Breast cancer Neg Hx    Colon cancer Neg Hx    Esophageal cancer Neg Hx    Rectal cancer Neg Hx    Stomach cancer Neg Hx    Social History   Socioeconomic History   Marital status: Single    Spouse name: Not on file   Number of children: 2   Years of education: Not on file   Highest education level: Not on file  Occupational History   Occupation: retired    Comment: Primary school teacher products   Tobacco Use   Smoking status: Never   Smokeless tobacco: Never  Substance and Sexual Activity   Alcohol use: No    Alcohol/week: 0.0 standard drinks of alcohol   Drug use: No   Sexual activity: Not on file  Other Topics Concern   Not on file  Social History Narrative   No regular exercise   Lives in noisy environment- very stressful    Daily caffeine use    Social Drivers of Corporate investment banker Strain: Low Risk  (09/09/2024)   Overall Financial Resource Strain (CARDIA)    Difficulty of Paying Living Expenses: Not hard at  all  Food Insecurity: No Food Insecurity (09/09/2024)   Hunger Vital Sign    Worried About Running Out of Food in the Last Year: Never true    Ran Out of Food in the Last Year: Never true  Transportation Needs: No Transportation Needs (09/09/2024)   PRAPARE - Administrator, Civil Service (Medical): No    Lack of Transportation (Non-Medical): No  Physical Activity: Inactive (09/09/2024)   Exercise Vital Sign  Days of Exercise per Week: 0 days    Minutes of Exercise per Session: 0 min  Stress: No Stress Concern Present (09/09/2024)   [employer] of Occupational Health - Occupational Stress Questionnaire    Feeling of Stress: Not at all  Social Connections: Moderately Isolated (09/09/2024)   Social Connection and Isolation Panel    Frequency of Communication with Friends and Family: More than three times a week    Frequency of Social Gatherings with Friends and Family: More than three times a week    Attends Religious Services: More than 4 times per year    Active Member of [employer] or Organizations: No    Attends Engineer, structural: Never    Marital Status: Divorced    Tobacco Counseling Counseling given: Not Answered    Clinical Intake:  Pre-visit preparation completed: Yes  Pain : No/denies pain     BMI - recorded: 31.71 Nutritional Status: BMI > 30  Obese Nutritional Risks: None Diabetes: No  No results found for: HGBA1C   How often do you need to have someone help you when you read instructions, pamphlets, or other written materials from your doctor or pharmacy?: 1 - Never  Interpreter Needed?: No  Information entered by :: R. Eathel Pajak LPN   Activities of Daily Living     09/09/2024    8:54 AM  In your present state of health, do you have any difficulty performing the following activities:  Hearing? 0  Vision? 0  Difficulty concentrating or making decisions? 0  Walking or climbing stairs? 0  Dressing or bathing? 0  Doing errands,  shopping? 0  Preparing Food and eating ? N  Using the Toilet? N  In the past six months, have you accidently leaked urine? N  Do you have problems with loss of bowel control? N  Managing your Medications? N  Managing your Finances? N  Housekeeping or managing your Housekeeping? N    Patient Care Team: Glendia Shad, MD as PCP - General (Internal Medicine) Reddy, Pallavi D, MD as Consulting Physician (Sleep Medicine) Jane Delmar Pike, NP as Nurse Practitioner (Gastroenterology)  I have updated your Care Teams any recent Medical Services you may have received from other providers in the past year.     Assessment:   This is a routine wellness examination for Jill Torres.  Hearing/Vision screen Hearing Screening - Comments:: No issues Vision Screening - Comments:: glasses   Goals Addressed             This Visit's Progress    Patient Stated       Wants to lose weight and start an exercise program       Depression Screen     09/09/2024    9:00 AM 03/08/2024    2:58 PM 09/04/2023    9:08 AM 12/22/2022   10:55 AM 07/22/2022    7:11 AM 03/22/2022    7:36 AM 02/10/2022    8:38 AM  PHQ 2/9 Scores  PHQ - 2 Score 3 0 0 0 0 0 0  PHQ- 9 Score 3 0 0        Fall Risk     09/09/2024    8:55 AM 03/08/2024    2:58 PM 09/04/2023    9:04 AM 12/22/2022   10:55 AM 07/22/2022    7:11 AM  Fall Risk   Falls in the past year? 0 0 0 0 0  Number falls in past yr: 0 0 0 0 0  Injury  with Fall? 0 0 0 0 0  Risk for fall due to : No Fall Risks No Fall Risks No Fall Risks No Fall Risks No Fall Risks  Follow up Falls evaluation completed;Falls prevention discussed Falls evaluation completed Falls prevention discussed;Falls evaluation completed Falls evaluation completed  Falls evaluation completed      Data saved with a previous flowsheet row definition    MEDICARE RISK AT HOME:  Medicare Risk at Home Any stairs in or around the home?: No If so, are there any without handrails?:  No Home free of loose throw rugs in walkways, pet beds, electrical cords, etc?: Yes Adequate lighting in your home to reduce risk of falls?: Yes Life alert?: No Use of a cane, walker or w/c?: No Grab bars in the bathroom?: Yes Shower chair or bench in shower?: No Elevated toilet seat or a handicapped toilet?: No  TIMED UP AND GO:  Was the test performed?  No  Cognitive Function: 6CIT completed        09/09/2024    9:05 AM 09/04/2023    9:13 AM 02/10/2022    8:40 AM 02/07/2020    8:46 AM  6CIT Screen  What Year? 0 points 0 points 0 points 0 points  What month? 0 points 0 points 0 points 0 points  What time? 0 points 0 points 0 points 0 points  Count back from 20 0 points 0 points  0 points  Months in reverse 0 points 0 points 0 points 0 points  Repeat phrase 0 points 0 points  0 points  Total Score 0 points 0 points  0 points    Immunizations Immunization History  Administered Date(s) Administered   Fluad Quad(high Dose 65+) 09/16/2021, 10/27/2022   Fluad Trivalent(High Dose 65+) 08/29/2023   INFLUENZA, HIGH DOSE SEASONAL PF 08/16/2018, 09/30/2020   Influenza Split 08/29/2014   Influenza,inj,Quad PF,6+ Mos 08/13/2019   Influenza-Unspecified 08/27/2015, 08/21/2017   PFIZER(Purple Top)SARS-COV-2 Vaccination 04/23/2020, 05/14/2020   Pneumococcal Conjugate-13 04/11/2018   Pneumococcal Polysaccharide-23 07/26/2019   Tdap 04/18/2011    Screening Tests Health Maintenance  Topic Date Due   Medicare Annual Wellness (AWV)  09/03/2024   Zoster Vaccines- Shingrix (1 of 2) 11/28/2024 (Originally 07/20/2002)   Influenza Vaccine  03/11/2025 (Originally 07/12/2024)   Mammogram  11/20/2024   Colonoscopy  09/04/2034   Pneumococcal Vaccine: 50+ Years  Completed   DEXA SCAN  Completed   Hepatitis C Screening  Completed   HPV VACCINES  Aged Out   Meningococcal B Vaccine  Aged Out   DTaP/Tdap/Td  Discontinued   COVID-19 Vaccine  Discontinued    Health Maintenance Items  Addressed: Mammogram ordered Discussed the need to update flu vaccine. Patient declines shingles vaccines.  Additional Screening:  Vision Screening: Recommended annual ophthalmology exams for early detection of glaucoma and other disorders of the eye. Is the patient up to date with their annual eye exam?  Yes  Who is the provider or what is the name of the office in which the patient attends annual eye exams? [facility]  Dental Screening: Recommended annual dental exams for proper oral hygiene  Community Resource Referral / Chronic Care Management: CRR required this visit?  No   CCM required this visit?  No   Plan:    I have personally reviewed and noted the following in the patient's chart:   Medical and social history Use of alcohol, tobacco or illicit drugs  Current medications and supplements including opioid prescriptions. Patient is not currently  taking opioid prescriptions. Functional ability and status Nutritional status Physical activity Advanced directives List of other physicians Hospitalizations, surgeries, and ER visits in previous 12 months Vitals Screenings to include cognitive, depression, and falls Referrals and appointments  In addition, I have reviewed and discussed with patient certain preventive protocols, quality metrics, and best practice recommendations. A written personalized care plan for preventive services as well as general preventive health recommendations were provided to patient.   Angeline Fredericks, LPN   [id number]   After Visit Summary: (MyChart) Due to this being a telephonic visit, the after visit summary with patients personalized plan was offered to patient via MyChart   Notes: Nothing significant to report at this time.

## 2024-10-04 ENCOUNTER — Other Ambulatory Visit: Payer: Self-pay | Admitting: Internal Medicine

## 2024-10-27 DIAGNOSIS — G4733 Obstructive sleep apnea (adult) (pediatric): Secondary | ICD-10-CM | POA: Diagnosis not present

## 2024-10-28 ENCOUNTER — Ambulatory Visit (INDEPENDENT_AMBULATORY_CARE_PROVIDER_SITE_OTHER): Payer: Self-pay | Admitting: Internal Medicine

## 2024-10-28 VITALS — BP 123/78 | HR 63 | Resp 16 | Ht 63.0 in | Wt 180.0 lb

## 2024-10-28 DIAGNOSIS — Z7189 Other specified counseling: Secondary | ICD-10-CM | POA: Diagnosis not present

## 2024-10-28 DIAGNOSIS — G4733 Obstructive sleep apnea (adult) (pediatric): Secondary | ICD-10-CM

## 2024-10-28 NOTE — Patient Instructions (Signed)

## 2024-10-28 NOTE — Progress Notes (Unsigned)
 Sleep Medicine   Office Visit  Patient Name: Jill Torres DOB: 01/17/1951 MRN [id number]    Chief Complaint: OSA  Brief History:  Jill Torres presents for an initial consult for sleep evaluation and to establish care. The patient has a 9 month history of sleep apnea and is currently on a CPAP. Prior to using a PAP, sleep quality was poor and patient was awakening tired and was gasping.  This was noted every nights. The patient reported the following symptoms:  fatigue, snoring, gasping, choking, and occasional brain fog. The patient goes to sleep at 1000 pm and wakes up at 0630 am. The patient reports no history of psychiatric problems. The Epworth Sleepiness Score is 1 out of 24 . Cardiovascular risk factors include: hypertension. . The patient is currently on a APAP@ 4-10 cmH2O. The patient reports using her PAP and feels rested after sleeping with PAP.  The patient reports benefiting from PAP use and would like for her to continue using PAP. Reported sleepiness is improved. The compliance download shows  98% compliance with an average use time of 7 hours 57  minutes. The AHI is 5.3.  The patient continues to require PAP therapy as a medical necessity in order to eliminate her sleep apnea.   ROS  General: (-) fever, (-) chills, (-) night sweat Nose and Sinuses: (-) nasal stuffiness or itchiness, (-) postnasal drip, (-) nosebleeds, (-) sinus trouble. Mouth and Throat: (-) sore throat, (-) hoarseness. Neck: (-) swollen glands, (-) enlarged thyroid , (-) neck pain. Respiratory: - cough, - shortness of breath, - wheezing. Neurologic: - numbness, - tingling. Psychiatric: - anxiety, - depression Sleep behavior: -sleep paralysis -hypnogogic hallucinations -dream enactment      -vivid dreams -cataplexy -night terrors -sleep walking   Current Medication: Outpatient Encounter Medications as of 10/28/2024  Medication Sig   amLODipine  (NORVASC ) 2.5 MG tablet Take 1 tablet (2.5 mg total) by mouth  daily.   Ascorbic Acid (VITAMIN C) 1000 MG tablet Take 1,000 mg by mouth daily.   Azelastine  HCl 137 MCG/SPRAY SOLN PLACE 1 SPRAY IN BOTH NOSTRILS TWICE A DAY AS DIRECTED   bifidobacterium infantis (ALIGN) capsule Take 1 capsule by mouth daily.   calcium -vitamin D (OSCAL WITH D 500-200) 500-200 MG-UNIT per tablet Take 1 tablet by mouth daily.   Coenzyme Q10 (COQ10 PO) Take by mouth daily.   fish oil-omega-3 fatty acids 1000 MG capsule Take 2 g by mouth daily.   fluticasone  (FLONASE ) 50 MCG/ACT nasal spray SPRAY 2 SPRAYS INTO EACH NOSTRIL EVERY DAY   multivitamin (THERAGRAN) per tablet Take 1 tablet by mouth daily.   polyethylene glycol powder (GLYCOLAX/MIRALAX) 17 GM/SCOOP powder Take 17 g by mouth as needed.   psyllium (REGULOID) 0.52 g capsule Take 0.52 g by mouth daily as needed. (Patient not taking: Reported on 09/09/2024)   rosuvastatin  (CRESTOR ) 20 MG tablet Take 1 tablet (20 mg total) by mouth daily.   Simethicone (GAS-X PO) Take by mouth daily as needed.   No facility-administered encounter medications on file as of 10/28/2024.    Surgical History: Past Surgical History:  Procedure Laterality Date   ABDOMINAL HYSTERECTOMY  1983   due to heavy bleeding & ovary cyst   APPENDECTOMY  1974   BLADDER DIVERTICULECTOMY  1990   CESAREAN SECTION     x 2   CHOLECYSTECTOMY     DILATION AND CURETTAGE OF UTERUS     x 2   DIRECT LARYNGOSCOPY Right 06/11/2015   Procedure: suspension microdirect laryngoscopy with  biopsy of right tongue base;  Surgeon: Carolee Hunter, MD;  Location: [facility];  Service: ENT;  Laterality: Right;   NASAL SEPTUM SURGERY  1999   PARTIAL HYSTERECTOMY  1983   cyst on ovary and heavy bleeding     Medical History: Past Medical History:  Diagnosis Date   Allergy    Arthritis    Depression    Diverticulosis of colon (without mention of hemorrhage)    GERD (gastroesophageal reflux disease)    History of chicken pox    History of shingles may 2013   HLD  (hyperlipidemia)    HTN (hypertension)    Irritable bowel syndrome    Unspecified hypothyroidism     Family History: Non contributory to the present illness  Social History: Social History   Socioeconomic History   Marital status: Single    Spouse name: Not on file   Number of children: 2   Years of education: Not on file   Highest education level: Not on file  Occupational History   Occupation: retired    Comment: Primary school teacher products   Tobacco Use   Smoking status: Never   Smokeless tobacco: Never  Substance and Sexual Activity   Alcohol use: No    Alcohol/week: 0.0 standard drinks of alcohol   Drug use: No   Sexual activity: Not on file  Other Topics Concern   Not on file  Social History Narrative   No regular exercise   Lives in noisy environment- very stressful    Daily caffeine use    Social Drivers of Corporate Investment Banker Strain: Low Risk  (09/09/2024)   Overall Financial Resource Strain (CARDIA)    Difficulty of Paying Living Expenses: Not hard at all  Food Insecurity: No Food Insecurity (09/09/2024)   Hunger Vital Sign    Worried About Running Out of Food in the Last Year: Never true    Ran Out of Food in the Last Year: Never true  Transportation Needs: No Transportation Needs (09/09/2024)   PRAPARE - Administrator, Civil Service (Medical): No    Lack of Transportation (Non-Medical): No  Physical Activity: Inactive (09/09/2024)   Exercise Vital Sign    Days of Exercise per Week: 0 days    Minutes of Exercise per Session: 0 min  Stress: No Stress Concern Present (09/09/2024)   [employer] of Occupational Health - Occupational Stress Questionnaire    Feeling of Stress: Not at all  Social Connections: Moderately Isolated (09/09/2024)   Social Connection and Isolation Panel    Frequency of Communication with Friends and Family: More than three times a week    Frequency of Social Gatherings with Friends and Family: More than three  times a week    Attends Religious Services: More than 4 times per year    Active Member of [employer] or Organizations: No    Attends Banker Meetings: Never    Marital Status: Divorced  Catering Manager Violence: Not At Risk (09/09/2024)   Humiliation, Afraid, Rape, and Kick questionnaire    Fear of Current or Ex-Partner: No    Emotionally Abused: No    Physically Abused: No    Sexually Abused: No    Vital Signs: Last menstrual period 12/12/1981. There is no height or weight on file to calculate BMI.   Examination: General Appearance: The patient is well-developed, well-nourished, and in no distress. Neck Circumference: 39 cm Skin: Gross inspection of skin unremarkable. Head: normocephalic, no gross deformities.  Eyes: no gross deformities noted. ENT: ears appear grossly normal Neurologic: Alert and oriented. No involuntary movements.    STOP BANG RISK ASSESSMENT S (snore) Have you been told that you snore?     NO   T (tired) Are you often tired, fatigued, or sleepy during the day?   NO  O (obstruction) Do you stop breathing, choke, or gasp during sleep? NO   P (pressure) Do you have or are you being treated for high blood pressure? YES   B (BMI) Is your body index greater than 35 kg/m? NO   A (age) Are you 49 years old or older? YES   N (neck) Do you have a neck circumference greater than 16 inches?   NO   G (gender) Are you a female? NO   TOTAL STOP/BANG "YES" ANSWERS 2                                                               A STOP-Bang score of 2 or less is considered low risk, and a score of 5 or more is high risk for having either moderate or severe OSA. For people who score 3 or 4, doctors may need to perform further assessment to determine how likely they are to have OSA.         EPWORTH SLEEPINESS SCALE:  Scale:  (0)= no chance of dozing; (1)= slight chance of dozing; (2)= moderate chance of dozing; (3)= high chance of dozing  Chance   Situtation    Sitting and reading: 0    Watching TV: 1    Sitting Inactive in public: 0    As a passenger in car: 0      Lying down to rest: 0    Sitting and talking: 0    Sitting quielty after lunch: 0    In a car, stopped in traffic: 0   TOTAL SCORE:   1 out of 24    SLEEP STUDIES:  HST (01/2024 with SNAP) AHI 54.7/hr, min SpO2 86%   LABS: Recent Results (from the past 2160 hours)  Hepatic function panel     Status: None   Collection Time: 08/26/24  8:35 AM  Result Value Ref Range   Total Bilirubin 0.6 0.2 - 1.2 mg/dL   Bilirubin, Direct 0.1 0.0 - 0.3 mg/dL   Alkaline Phosphatase 57 39 - 117 U/L   AST 13 0 - 37 U/L   ALT 12 0 - 35 U/L   Total Protein 6.7 6.0 - 8.3 g/dL   Albumin 4.2 3.5 - 5.2 g/dL  Basic metabolic panel     Status: Abnormal   Collection Time: 08/26/24  8:35 AM  Result Value Ref Range   Sodium 139 135 - 145 mEq/L   Potassium 4.5 3.5 - 5.1 mEq/L   Chloride 104 96 - 112 mEq/L   CO2 27 19 - 32 mEq/L   Glucose, Bld 88 70 - 99 mg/dL   BUN 13 6 - 23 mg/dL   Creatinine, Ser 8.96 0.40 - 1.20 mg/dL   GFR 45.51 (L) >39.99 mL/min    Comment: Calculated using the CKD-EPI Creatinine Equation (2021)   Calcium  9.6 8.4 - 10.5 mg/dL  Lipid panel     Status: Abnormal   Collection Time: 08/26/24  8:35 AM  Result Value Ref Range   Cholesterol 253 (H) 0 - 200 mg/dL    Comment: ATP III Classification       Desirable:  < 200 mg/dL               Borderline High:  200 - 239 mg/dL          High:  > = 759 mg/dL   Triglycerides 853.9 0.0 - 149.0 mg/dL    Comment: Normal:  <849 mg/dLBorderline High:  150 - 199 mg/dL   HDL 39.79 >60.99 mg/dL   VLDL 70.7 0.0 - 59.9 mg/dL   LDL Cholesterol 835 (H) 0 - 99 mg/dL   Total CHOL/HDL Ratio 4     Comment:                Men          Women1/2 Average Risk     3.4          3.3Average Risk          5.0          4.42X Average Risk          9.6          7.13X Average Risk          15.0          11.0                       NonHDL  193.02     Comment: NOTE:  Non-HDL goal should be 30 mg/dL higher than patient's LDL goal (i.e. LDL goal of < 70 mg/dL, would have non-HDL goal of < 100 mg/dL)    Radiology: CT ABDOMEN PELVIS W CONTRAST Result Date: 07/10/2024 CLINICAL DATA:  Lower abdominal pain for 1 day EXAM: CT ABDOMEN AND PELVIS WITH CONTRAST TECHNIQUE: Multidetector CT imaging of the abdomen and pelvis was performed using the standard protocol following bolus administration of intravenous contrast. RADIATION DOSE REDUCTION: This exam was performed according to the departmental dose-optimization program which includes automated exposure control, adjustment of the mA and/or kV according to patient size and/or use of iterative reconstruction technique. CONTRAST:  OMNIPAQUE  IOHEXOL  300 MG/ML  SOLN COMPARISON:  02/17/2023 FINDINGS: Lower chest: No acute abnormality. Hepatobiliary: No focal liver abnormality is seen. Status post cholecystectomy. No biliary dilatation. Pancreas: Unremarkable. No pancreatic ductal dilatation or surrounding inflammatory changes. Spleen: Normal in size without focal abnormality. Adrenals/Urinary Tract: Adrenal glands are within normal limits. Kidneys demonstrate a normal enhancement pattern bilaterally. No renal calculi or obstructive changes are seen. The bladder is well distended. Stomach/Bowel: Scattered diverticular changes noted throughout the sigmoid colon. The appendix has been surgically removed. Mild inflammatory changes are noted surrounding the cecum which may represent some very typhlitis. No abscess or perforation is noted. Small bowel shows no obstructive changes. Stomach is within normal limits. Vascular/Lymphatic: Aortic atherosclerosis. No enlarged abdominal or pelvic lymph nodes. Reproductive: Status post hysterectomy. No adnexal masses. Other: No abdominal wall hernia or abnormality. No abdominopelvic ascites. Musculoskeletal: No acute or significant osseous findings. IMPRESSION: Mild  pericecal inflammatory change suggestive of focal colitis. No perforation or abscess is seen. No other focal abnormality is noted. Electronically Signed   By: Oneil Devonshire M.D.   On: 07/10/2024 21:52    No results found.  No results found.    Assessment and Plan: Patient Active Problem List   Diagnosis Date Noted   Chronic rhinitis 07/08/2024  OSA (obstructive sleep apnea) 05/02/2024   Non-recurrent acute suppurative otitis media of both ears without spontaneous rupture of tympanic membranes 03/24/2024   Muscle ache 12/29/2023   Neck pain 01/12/2020   History of hematuria 01/08/2020   Otitis externa 07/28/2019   Arthralgia 09/14/2017   Fatigue 09/14/2017   Hypercholesterolemia 03/05/2016   Health care maintenance 10/21/2015   Left lower quadrant pain 10/18/2015   Environmental allergies 09/07/2014   Ear fullness 05/10/2014   HTN (hypertension) 08/18/2013   Right carotid bruit 08/18/2013   Anxiety 07/17/2013   GERD (gastroesophageal reflux disease) 06/16/2013   Breast cancer screening 04/18/2011   DEVIATED NASAL SEPTUM 09/09/2010   History of colonic polyps 03/27/2009   Hypothyroidism 02/19/2009   Diverticulosis of colon 02/19/2009   Irritable bowel syndrome 02/19/2009   Abdominal pain 02/19/2009   1. OSA (obstructive sleep apnea) (Primary)  Patient evaluation reveals severe OSA. She is on APAP 4-10 with excellent compliance and an AHI of 5.3. I recommended a wedge pillow to help manage her apnea. F/u 54m.   2. CPAP use counseling CPAP Counseling: had a lengthy discussion with the patient regarding the importance of PAP therapy in management of the sleep apnea. Patient appears to understand the risk factor reduction and also understands the risks associated with untreated sleep apnea. Patient will try to make a good faith effort to remain compliant with therapy. Also instructed the patient on proper cleaning of the device including the water must be changed daily if possible  and use of distilled water is preferred. Patient understands that the machine should be regularly cleaned with appropriate recommended cleaning solutions that do not damage the PAP machine for example given white vinegar and water rinses. Other methods such as ozone treatment may not be as good as these simple methods to achieve cleaning.     General Counseling: I have discussed the findings of the evaluation and examination with [place].  I have also discussed any further diagnostic evaluation thatmay be needed or ordered today. Vira verbalizes understanding of the findings of todays visit. We also reviewed her medications today and discussed drug interactions and side effects including but not limited excessive drowsiness and altered mental states. We also discussed that there is always a risk not just to her but also people around her. she has been encouraged to call the office with any questions or concerns that should arise related to todays visit.  No orders of the defined types were placed in this encounter.       I have personally obtained a history, evaluated the patient, evaluated pertinent data, formulated the assessment and plan and placed orders.   This patient was seen today by Lauraine Lay, PA-C in collaboration with Dr. Elfreda Bathe.   Elfreda DELENA Bathe, MD [facility] Diplomate ABMS Pulmonary and Critical Care Medicine Sleep medicine

## 2024-11-14 ENCOUNTER — Ambulatory Visit: Admitting: Sleep Medicine

## 2024-11-14 ENCOUNTER — Other Ambulatory Visit: Payer: Self-pay | Admitting: Internal Medicine

## 2024-11-21 ENCOUNTER — Inpatient Hospital Stay: Admission: RE | Admit: 2024-11-21 | Discharge: 2024-11-21 | Attending: Internal Medicine | Admitting: Internal Medicine

## 2024-11-21 DIAGNOSIS — Z1231 Encounter for screening mammogram for malignant neoplasm of breast: Secondary | ICD-10-CM | POA: Insufficient documentation

## 2024-11-26 DIAGNOSIS — G4733 Obstructive sleep apnea (adult) (pediatric): Secondary | ICD-10-CM | POA: Diagnosis not present

## 2024-12-31 ENCOUNTER — Other Ambulatory Visit (INDEPENDENT_AMBULATORY_CARE_PROVIDER_SITE_OTHER)

## 2024-12-31 DIAGNOSIS — R5383 Other fatigue: Secondary | ICD-10-CM

## 2024-12-31 DIAGNOSIS — I10 Essential (primary) hypertension: Secondary | ICD-10-CM | POA: Diagnosis not present

## 2024-12-31 DIAGNOSIS — E7800 Pure hypercholesterolemia, unspecified: Secondary | ICD-10-CM

## 2024-12-31 LAB — BASIC METABOLIC PANEL WITH GFR
BUN: 8 mg/dL (ref 6–23)
CO2: 30 meq/L (ref 19–32)
Calcium: 9.5 mg/dL (ref 8.4–10.5)
Chloride: 104 meq/L (ref 96–112)
Creatinine, Ser: 1.02 mg/dL (ref 0.40–1.20)
GFR: 54.98 mL/min — ABNORMAL LOW
Glucose, Bld: 89 mg/dL (ref 70–99)
Potassium: 4.5 meq/L (ref 3.5–5.1)
Sodium: 140 meq/L (ref 135–145)

## 2024-12-31 LAB — HEPATIC FUNCTION PANEL
ALT: 13 U/L (ref 3–35)
AST: 13 U/L (ref 5–37)
Albumin: 4.2 g/dL (ref 3.5–5.2)
Alkaline Phosphatase: 60 U/L (ref 39–117)
Bilirubin, Direct: 0.1 mg/dL (ref 0.1–0.3)
Total Bilirubin: 0.6 mg/dL (ref 0.2–1.2)
Total Protein: 6.9 g/dL (ref 6.0–8.3)

## 2024-12-31 LAB — LIPID PANEL
Cholesterol: 138 mg/dL (ref 28–200)
HDL: 58.8 mg/dL
LDL Cholesterol: 55 mg/dL (ref 10–99)
NonHDL: 79.09
Total CHOL/HDL Ratio: 2
Triglycerides: 120 mg/dL (ref 10.0–149.0)
VLDL: 24 mg/dL (ref 0.0–40.0)

## 2024-12-31 LAB — TSH: TSH: 1.1 u[IU]/mL (ref 0.35–5.50)

## 2025-01-01 ENCOUNTER — Ambulatory Visit: Payer: Self-pay | Admitting: Internal Medicine

## 2025-01-02 ENCOUNTER — Ambulatory Visit: Admitting: Internal Medicine

## 2025-01-02 ENCOUNTER — Encounter: Payer: Self-pay | Admitting: Internal Medicine

## 2025-01-02 VITALS — BP 138/68 | HR 60 | Temp 98.2°F | Ht 63.0 in | Wt 178.0 lb

## 2025-01-02 DIAGNOSIS — R1032 Left lower quadrant pain: Secondary | ICD-10-CM

## 2025-01-02 DIAGNOSIS — G4733 Obstructive sleep apnea (adult) (pediatric): Secondary | ICD-10-CM

## 2025-01-02 DIAGNOSIS — J310 Chronic rhinitis: Secondary | ICD-10-CM

## 2025-01-02 DIAGNOSIS — E7800 Pure hypercholesterolemia, unspecified: Secondary | ICD-10-CM | POA: Diagnosis not present

## 2025-01-02 DIAGNOSIS — I10 Essential (primary) hypertension: Secondary | ICD-10-CM

## 2025-01-02 DIAGNOSIS — R030 Elevated blood-pressure reading, without diagnosis of hypertension: Secondary | ICD-10-CM

## 2025-01-02 DIAGNOSIS — J019 Acute sinusitis, unspecified: Secondary | ICD-10-CM | POA: Diagnosis not present

## 2025-01-02 DIAGNOSIS — Z860100 Personal history of colon polyps, unspecified: Secondary | ICD-10-CM | POA: Diagnosis not present

## 2025-01-02 MED ORDER — AMOXICILLIN-POT CLAVULANATE 875-125 MG PO TABS: 1.0000 | ORAL_TABLET | Freq: Two times a day (BID) | ORAL | 0 refills | Status: AC

## 2025-01-02 MED ORDER — AMLODIPINE BESYLATE 2.5 MG PO TABS: 2.5000 mg | ORAL_TABLET | Freq: Every day | ORAL | 3 refills | Status: AC

## 2025-01-02 MED ORDER — ROSUVASTATIN CALCIUM 20 MG PO TABS: 20.0000 mg | ORAL_TABLET | Freq: Every day | ORAL | 3 refills | Status: AC

## 2025-01-02 NOTE — Progress Notes (Signed)
 "  Subjective:    Patient ID: Jill Torres, female    DOB: 08/06/1951, 74 y.o.   MRN: [id number]  Patient here for  Chief Complaint  Patient presents with   Medical Management of Chronic Issues    HPI Here for a scheduled follow up - follow up regarding sleep apnea, hypertension and hypercholesterolemia. Saw Dr Fernand 10/28/24 - f/u sleep apnea. Saw Dr Senior 07/08/24 - recurrent rhinosinusitis and ear fullness and pressure. He recommended nasal irrigations twice daily and astelin  nasal spray. Also supplement with flonase  prn. 09/02/24 - colonoscopy - diverticulosis and internal hemorrhoids. Reports starting 12/29/24 - increased drainage. Scratchy throat. Some ear drainage. Increased sinus pressure. Nasal congestion. Thick drainage. Green mucus production. No sore throat now. No chest congestion or sob. No increased cough. No vomiting or diarrhea. No fever or body aches. Feels like her typical sinus infection.    Past Medical History:  Diagnosis Date   Allergy    Arthritis    Depression    Diverticulosis of colon (without mention of hemorrhage)    GERD (gastroesophageal reflux disease)    History of chicken pox    History of shingles may 2013   HLD (hyperlipidemia)    HTN (hypertension)    Irritable bowel syndrome    Unspecified hypothyroidism    Past Surgical History:  Procedure Laterality Date   ABDOMINAL HYSTERECTOMY  1983   due to heavy bleeding & ovary cyst   APPENDECTOMY  1974   BLADDER DIVERTICULECTOMY  1990   CESAREAN SECTION     x 2   CHOLECYSTECTOMY     DILATION AND CURETTAGE OF UTERUS     x 2   DIRECT LARYNGOSCOPY Right 06/11/2015   Procedure: suspension microdirect laryngoscopy with biopsy of right tongue base;  Surgeon: Carolee Hunter, MD;  Location: [facility];  Service: ENT;  Laterality: Right;   NASAL SEPTUM SURGERY  1999   PARTIAL HYSTERECTOMY  1983   cyst on ovary and heavy bleeding    Family History  Problem Relation Age of Onset   Arthritis Mother     Hypertension Mother    Hyperlipidemia Mother    Atrial fibrillation Mother    Heart disease Mother    Diabetes Mother    Colon polyps Mother    Hyperlipidemia Father    Heart disease Father        s/p CABG   Stroke Father    Rheumatic fever Sister        s/p valve replacement   Deep vein thrombosis Brother        after immobilization    Colon polyps Brother    Heart disease Brother    Diabetes Maternal Grandfather    Heart disease Brother    Diabetes Maternal Aunt    Diabetes Maternal Uncle    Breast cancer Neg Hx    Colon cancer Neg Hx    Esophageal cancer Neg Hx    Rectal cancer Neg Hx    Stomach cancer Neg Hx    Social History   Socioeconomic History   Marital status: Single    Spouse name: Not on file   Number of children: 2   Years of education: Not on file   Highest education level: 12th grade  Occupational History   Occupation: retired    Comment: Primary school teacher products   Tobacco Use   Smoking status: Never   Smokeless tobacco: Never  Substance and Sexual Activity   Alcohol use: No  Alcohol/week: 0.0 standard drinks of alcohol   Drug use: No   Sexual activity: Not on file  Other Topics Concern   Not on file  Social History Narrative   No regular exercise   Lives in noisy environment- very stressful    Daily caffeine use    Social Drivers of Health   Tobacco Use: Low Risk (01/06/2025)   Patient History    Smoking Tobacco Use: Never    Smokeless Tobacco Use: Never    Passive Exposure: Not on file  Financial Resource Strain: Low Risk (12/30/2024)   Overall Financial Resource Strain (CARDIA)    Difficulty of Paying Living Expenses: Not hard at all  Food Insecurity: No Food Insecurity (12/30/2024)   Epic    Worried About Programme Researcher, Broadcasting/film/video in the Last Year: Never true    Ran Out of Food in the Last Year: Never true  Transportation Needs: No Transportation Needs (12/30/2024)   Epic    Lack of Transportation (Medical): No    Lack of Transportation  (Non-Medical): No  Physical Activity: Sufficiently Active (12/30/2024)   Exercise Vital Sign    Days of Exercise per Week: 4 days    Minutes of Exercise per Session: 150+ min  Stress: No Stress Concern Present (12/30/2024)   [employer] of Occupational Health - Occupational Stress Questionnaire    Feeling of Stress: Not at all  Social Connections: Moderately Integrated (12/30/2024)   Social Connection and Isolation Panel    Frequency of Communication with Friends and Family: More than three times a week    Frequency of Social Gatherings with Friends and Family: More than three times a week    Attends Religious Services: More than 4 times per year    Active Member of Clubs or Organizations: Yes    Attends Banker Meetings: 1 to 4 times per year    Marital Status: Divorced  Depression (PHQ2-9): Low Risk (01/02/2025)   Depression (PHQ2-9)    PHQ-2 Score: 0  Alcohol Screen: Low Risk (09/09/2024)   Alcohol Screen    Last Alcohol Screening Score (AUDIT): 0  Housing: Low Risk (12/30/2024)   Epic    Unable to Pay for Housing in the Last Year: No    Number of Times Moved in the Last Year: 0    Homeless in the Last Year: No  Utilities: Not At Risk (09/09/2024)   Epic    Threatened with loss of utilities: No  Health Literacy: Adequate Health Literacy (09/09/2024)   B1300 Health Literacy    Frequency of need for help with medical instructions: Never     Review of Systems  Constitutional:  Negative for appetite change, fever and unexpected weight change.  HENT:  Positive for congestion, postnasal drip and sinus pressure.   Respiratory:  Negative for cough, chest tightness and shortness of breath.   Cardiovascular:  Negative for chest pain, palpitations and leg swelling.  Gastrointestinal:  Negative for abdominal pain, diarrhea, nausea and vomiting.  Genitourinary:  Negative for difficulty urinating and dysuria.  Musculoskeletal:  Negative for joint swelling and myalgias.   Skin:  Negative for color change and rash.  Neurological:  Negative for dizziness and headaches.  Psychiatric/Behavioral:  Negative for agitation and dysphoric mood.        Objective:     BP 138/68   Pulse 60   Temp 98.2 F (36.8 C) (Oral)   Ht 5' 3 (1.6 m)   Wt 178 lb (80.7 kg)   LMP  12/12/1981   SpO2 98%   BMI 31.53 kg/m  Wt Readings from Last 3 Encounters:  01/02/25 178 lb (80.7 kg)  10/28/24 180 lb (81.6 kg)  09/09/24 179 lb (81.2 kg)    Physical Exam Vitals reviewed.  Constitutional:      General: She is not in acute distress.    Appearance: Normal appearance.  HENT:     Head: Normocephalic and atraumatic.     Comments: Increased sinus pressure to palpation.     Right Ear: External ear normal. There is no impacted cerumen.     Left Ear: Tympanic membrane and external ear normal. There is no impacted cerumen.     Mouth/Throat:     Pharynx: No oropharyngeal exudate or posterior oropharyngeal erythema.  Eyes:     General: No scleral icterus.       Right eye: No discharge.        Left eye: No discharge.     Conjunctiva/sclera: Conjunctivae normal.  Neck:     Thyroid : No thyromegaly.  Cardiovascular:     Rate and Rhythm: Normal rate and regular rhythm.  Pulmonary:     Effort: No respiratory distress.     Breath sounds: Normal breath sounds. No wheezing.  Abdominal:     General: Bowel sounds are normal.     Palpations: Abdomen is soft.     Tenderness: There is no abdominal tenderness.  Musculoskeletal:        General: No swelling or tenderness.     Cervical back: Neck supple. No tenderness.  Lymphadenopathy:     Cervical: No cervical adenopathy.  Skin:    Findings: No erythema or rash.  Neurological:     Mental Status: She is alert.  Psychiatric:        Mood and Affect: Mood normal.        Behavior: Behavior normal.         Outpatient Encounter Medications as of 01/02/2025  Medication Sig   amoxicillin -clavulanate (AUGMENTIN ) 875-125 MG tablet  Take 1 tablet by mouth 2 (two) times daily.   Ascorbic Acid (VITAMIN C) 1000 MG tablet Take 1,000 mg by mouth daily.   Azelastine  HCl 137 MCG/SPRAY SOLN PLACE 1 SPRAY IN BOTH NOSTRILS TWICE A DAY AS DIRECTED   bifidobacterium infantis (ALIGN) capsule Take 1 capsule by mouth daily.   calcium -vitamin D (OSCAL WITH D 500-200) 500-200 MG-UNIT per tablet Take 1 tablet by mouth daily.   Coenzyme Q10 (COQ10 PO) Take by mouth daily.   fish oil-omega-3 fatty acids 1000 MG capsule Take 2 g by mouth daily.   fluticasone  (FLONASE ) 50 MCG/ACT nasal spray SPRAY 2 SPRAYS INTO EACH NOSTRIL EVERY DAY   multivitamin (THERAGRAN) per tablet Take 1 tablet by mouth daily.   polyethylene glycol powder (GLYCOLAX/MIRALAX) 17 GM/SCOOP powder Take 17 g by mouth as needed.   Simethicone (GAS-X PO) Take by mouth daily as needed.   amLODipine  (NORVASC ) 2.5 MG tablet Take 1 tablet (2.5 mg total) by mouth daily.   rosuvastatin  (CRESTOR ) 20 MG tablet Take 1 tablet (20 mg total) by mouth daily.   [DISCONTINUED] amLODipine  (NORVASC ) 2.5 MG tablet Take 1 tablet (2.5 mg total) by mouth daily.   [DISCONTINUED] mometasone (NASONEX) 50 MCG/ACT nasal spray SMARTSIG:Both Nares   [DISCONTINUED] psyllium (REGULOID) 0.52 g capsule Take 0.52 g by mouth daily as needed. (Patient not taking: Reported on 01/02/2025)   [DISCONTINUED] rosuvastatin  (CRESTOR ) 20 MG tablet Take 1 tablet (20 mg total) by mouth daily.   No facility-administered  encounter medications on file as of 01/02/2025.     Lab Results  Component Value Date   WBC 14.0 (H) 07/10/2024   HGB 12.1 07/10/2024   HCT 36.3 07/10/2024   PLT 382 07/10/2024   GLUCOSE 89 12/31/2024   CHOL 138 12/31/2024   TRIG 120.0 12/31/2024   HDL 58.80 12/31/2024   LDLDIRECT 81.0 07/22/2022   LDLCALC 55 12/31/2024   ALT 13 12/31/2024   AST 13 12/31/2024   NA 140 12/31/2024   K 4.5 12/31/2024   CL 104 12/31/2024   CREATININE 1.02 12/31/2024   BUN 8 12/31/2024   CO2 30 12/31/2024   TSH  1.10 12/31/2024    MM 3D SCREENING MAMMOGRAM BILATERAL BREAST Result Date: 11/26/2024 CLINICAL DATA:  Screening. EXAM: DIGITAL SCREENING BILATERAL MAMMOGRAM WITH TOMOSYNTHESIS AND CAD TECHNIQUE: Bilateral screening digital craniocaudal and mediolateral oblique mammograms were obtained. Bilateral screening digital breast tomosynthesis was performed. The images were evaluated with computer-aided detection. COMPARISON:  Previous exam(s). ACR Breast Density Category b: There are scattered areas of fibroglandular density. FINDINGS: There are no findings suspicious for malignancy. IMPRESSION: No mammographic evidence of malignancy. A result letter of this screening mammogram will be mailed directly to the patient. RECOMMENDATION: Screening mammogram in one year. (Code:SM-B-01Y) BI-RADS CATEGORY  1: Negative. Electronically Signed   By: Dina  Arceo M.D.   On: 11/26/2024 15:03       Assessment & Plan:  Elevated blood pressure reading -     amLODIPine  Besylate; Take 1 tablet (2.5 mg total) by mouth daily.  Dispense: 90 tablet; Refill: 3  Hypertension, unspecified type Assessment & Plan: Continue amlodipine .  Follow pressures.  Follow metabolic panel. No change in medication today.   Orders: -     Basic metabolic panel with GFR; Future  Hypercholesterolemia Assessment & Plan: On crestor .  Low cholesterol diet and exercise.  Follow lipid panel.  Lab Results  Component Value Date   CHOL 138 12/31/2024   HDL 58.80 12/31/2024   LDLCALC 55 12/31/2024   LDLDIRECT 81.0 07/22/2022   TRIG 120.0 12/31/2024   CHOLHDL 2 12/31/2024     Orders: -     Lipid panel; Future -     Hepatic function panel; Future -     CBC with Differential/Platelet; Future  Chronic rhinitis Assessment & Plan: Saw Dr Senior 07/08/24 - recurrent rhinosinusitis and ear fullness and pressure. He recommended nasal irrigations twice daily and astelin  nasal spray. Also supplement with flonase  prn.    History of colonic  polyps Assessment & Plan: 02/22/19 - cecal polyp.  Recommended f/u colonoscopy in 10 years.     OSA (obstructive sleep apnea) Assessment & Plan:  Saw Dr Jess - f/u OSA. Recommended CPAP. Had f/u with Dr Jess 08/08/24 - compliant with cpap.    Acute sinusitis, recurrence not specified, unspecified location Assessment & Plan: Persistent symptoms as outlined. Appears to be c/w her previous sinus infections. Treat with augmentin  as directed. Saline nasal spray and steroid nasal spray as directed. Follow. Call with update.    Other orders -     Rosuvastatin  Calcium ; Take 1 tablet (20 mg total) by mouth daily.  Dispense: 90 tablet; Refill: 3 -     Amoxicillin -Pot Clavulanate; Take 1 tablet by mouth 2 (two) times daily.  Dispense: 14 tablet; Refill: 0     Allena Hamilton, MD "

## 2025-01-03 DIAGNOSIS — E2839 Other primary ovarian failure: Secondary | ICD-10-CM | POA: Insufficient documentation

## 2025-01-06 ENCOUNTER — Encounter: Payer: Self-pay | Admitting: Internal Medicine

## 2025-01-06 NOTE — Assessment & Plan Note (Signed)
 Saw Dr Jess - f/u OSA. Recommended CPAP. Had f/u with Dr Jess 08/08/24 - compliant with cpap.

## 2025-01-06 NOTE — Assessment & Plan Note (Signed)
 Persistent symptoms as outlined. Appears to be c/w her previous sinus infections. Treat with augmentin  as directed. Saline nasal spray and steroid nasal spray as directed. Follow. Call with update.

## 2025-01-06 NOTE — Assessment & Plan Note (Signed)
 On crestor .  Low cholesterol diet and exercise.  Follow lipid panel.  Lab Results  Component Value Date   CHOL 138 12/31/2024   HDL 58.80 12/31/2024   LDLCALC 55 12/31/2024   LDLDIRECT 81.0 07/22/2022   TRIG 120.0 12/31/2024   CHOLHDL 2 12/31/2024

## 2025-01-06 NOTE — Assessment & Plan Note (Signed)
 Continue amlodipine . Follow pressures. Follow metabolic panel. No change in medication today.

## 2025-01-06 NOTE — Assessment & Plan Note (Signed)
02/22/19 - cecal polyp.  Recommended f/u colonoscopy in 10 years.   ?

## 2025-01-06 NOTE — Assessment & Plan Note (Signed)
 Saw Dr Senior 07/08/24 - recurrent rhinosinusitis and ear fullness and pressure. He recommended nasal irrigations twice daily and astelin  nasal spray. Also supplement with flonase  prn.

## 2025-05-07 ENCOUNTER — Other Ambulatory Visit

## 2025-05-12 ENCOUNTER — Encounter: Admitting: Internal Medicine

## 2025-09-10 ENCOUNTER — Ambulatory Visit
